# Patient Record
Sex: Female | Born: 1985 | Race: White | Hispanic: No | Marital: Single | Smoking: Current every day smoker
Health system: Southern US, Community
[De-identification: ages and names within clinical notes are randomized; demographics above are authoritative.]

## PROBLEM LIST (undated history)

## (undated) DIAGNOSIS — K219 Gastro-esophageal reflux disease without esophagitis: Secondary | ICD-10-CM

## (undated) DIAGNOSIS — M25562 Pain in left knee: Secondary | ICD-10-CM

## (undated) HISTORY — PX: GASTRECTOMY: SHX58

---

## 2011-08-15 DIAGNOSIS — S069X1A Unspecified intracranial injury with loss of consciousness of 30 minutes or less, initial encounter: Secondary | ICD-10-CM | POA: Insufficient documentation

## 2011-08-15 DIAGNOSIS — S27321A Contusion of lung, unilateral, initial encounter: Secondary | ICD-10-CM | POA: Insufficient documentation

## 2011-08-15 DIAGNOSIS — J939 Pneumothorax, unspecified: Secondary | ICD-10-CM | POA: Insufficient documentation

## 2011-08-15 DIAGNOSIS — Z041 Encounter for examination and observation following transport accident: Secondary | ICD-10-CM | POA: Insufficient documentation

## 2011-08-15 DIAGNOSIS — S2231XA Fracture of one rib, right side, initial encounter for closed fracture: Secondary | ICD-10-CM | POA: Insufficient documentation

## 2011-08-15 DIAGNOSIS — S36114A Minor laceration of liver, initial encounter: Secondary | ICD-10-CM | POA: Insufficient documentation

## 2011-08-15 DIAGNOSIS — R188 Other ascites: Secondary | ICD-10-CM | POA: Insufficient documentation

## 2011-08-16 DIAGNOSIS — D62 Acute posthemorrhagic anemia: Secondary | ICD-10-CM | POA: Insufficient documentation

## 2011-08-16 DIAGNOSIS — D696 Thrombocytopenia, unspecified: Secondary | ICD-10-CM

## 2011-08-16 HISTORY — DX: Thrombocytopenia, unspecified: D69.6

## 2011-09-19 DIAGNOSIS — M5412 Radiculopathy, cervical region: Secondary | ICD-10-CM | POA: Insufficient documentation

## 2012-03-19 ENCOUNTER — Other Ambulatory Visit: Payer: Self-pay | Admitting: Orthopaedic Surgery

## 2012-03-27 NOTE — H&P (Signed)
Arlette Schaad is an 26 y.o. female.   Chief Complaint: left knee pain HPI: Latiana suffered a left knee injury while on the job a number of months ago.  She is complaining of pain in the anterior aspect of the left knee and is worse with squatting or kneeling or stair climbing.  She has undergone extensive physical therapy, bracing, and injection and some anti-inflammatory medicines all of which have been of minimal benefit.  MRI scan done on 01/27/12 shows some irritation and wear on the patellofemoral cartilage.  We have discussed proceeding with a left knee arthroscopy to decrease pain and improve function.  No past medical history on file.  No past surgical history on file.  No family history on file. Social History:  does not have a smoking history on file. She does not have any smokeless tobacco history on file. Her alcohol and drug histories not on file.  Allergies: Allergies not on fileno drug allergies and no chronic medications.  No prescriptions prior to admission    No results found for this or any previous visit (from the past 48 hour(s)). No results found.  Review of Systems  Constitutional: Negative.   HENT: Negative.   Eyes: Negative.   Respiratory: Negative.   Cardiovascular: Negative.   Gastrointestinal: Negative.   Genitourinary: Negative.   Musculoskeletal: Positive for joint pain.  Skin: Negative.   Neurological: Negative.   Endo/Heme/Allergies: Negative.   Psychiatric/Behavioral: Negative.     There were no vitals taken for this visit. Physical Exam  Constitutional: She is oriented to person, place, and time. She appears well-nourished.  HENT:  Head: Atraumatic.  Eyes: EOM are normal.  Neck: Neck supple.  Cardiovascular: Regular rhythm.   Respiratory: Breath sounds normal.  GI: Bowel sounds are normal.  Musculoskeletal:       Left knee exam: Crepitation with extension.  No significant effusion.  Range of motion 0-1 45.  She does have pain along the  medial patellar facet palpation.  Stable ligaments.  Neurological: She is oriented to person, place, and time.  Skin: Skin is warm.  Psychiatric: She has a normal mood and affect.     Assessment/Plan Assessment: Left knee traumatic chondromalacia patella Plan: We have discussed proceeding with an arthroscopy on Willo's left knee to eliminate her pain and improve her function..  Discussed the risks of anesthesia, infection related to this intervention.  She more than likely will need extensive therapy postoperatively as well.  Chrystal Zeimet R 03/27/2012, 5:58 PM

## 2012-03-29 ENCOUNTER — Encounter (HOSPITAL_BASED_OUTPATIENT_CLINIC_OR_DEPARTMENT_OTHER): Payer: Self-pay | Admitting: *Deleted

## 2012-03-29 NOTE — Pre-Procedure Instructions (Signed)
Does not know names of meds. To call back 04/01/2012 with names and doses.

## 2012-04-02 ENCOUNTER — Encounter (HOSPITAL_BASED_OUTPATIENT_CLINIC_OR_DEPARTMENT_OTHER)
Admission: RE | Disposition: A | Discharge status: Home / Self Care | Payer: Self-pay | Source: Ambulatory Visit | Attending: Orthopaedic Surgery

## 2012-04-02 ENCOUNTER — Encounter (HOSPITAL_BASED_OUTPATIENT_CLINIC_OR_DEPARTMENT_OTHER): Payer: Self-pay | Admitting: Anesthesiology

## 2012-04-02 ENCOUNTER — Ambulatory Visit (HOSPITAL_BASED_OUTPATIENT_CLINIC_OR_DEPARTMENT_OTHER): Payer: Worker's Compensation | Admitting: Anesthesiology

## 2012-04-02 ENCOUNTER — Ambulatory Visit (HOSPITAL_BASED_OUTPATIENT_CLINIC_OR_DEPARTMENT_OTHER)
Admission: RE | Admit: 2012-04-02 | Discharge: 2012-04-02 | Disposition: A | Discharge status: Home / Self Care | Payer: Worker's Compensation | Source: Ambulatory Visit | Attending: Orthopaedic Surgery | Admitting: Orthopaedic Surgery

## 2012-04-02 ENCOUNTER — Encounter (HOSPITAL_BASED_OUTPATIENT_CLINIC_OR_DEPARTMENT_OTHER): Payer: Self-pay | Admitting: *Deleted

## 2012-04-02 DIAGNOSIS — K219 Gastro-esophageal reflux disease without esophagitis: Secondary | ICD-10-CM | POA: Insufficient documentation

## 2012-04-02 DIAGNOSIS — M2242 Chondromalacia patellae, left knee: Secondary | ICD-10-CM

## 2012-04-02 DIAGNOSIS — M2240 Chondromalacia patellae, unspecified knee: Secondary | ICD-10-CM | POA: Insufficient documentation

## 2012-04-02 HISTORY — PX: KNEE ARTHROSCOPY: SHX127

## 2012-04-02 HISTORY — DX: Gastro-esophageal reflux disease without esophagitis: K21.9

## 2012-04-02 LAB — POCT HEMOGLOBIN-HEMACUE: Hemoglobin: 10.8 g/dL — ABNORMAL LOW (ref 12.0–15.0)

## 2012-04-02 SURGERY — ARTHROSCOPY, KNEE
Anesthesia: General | Site: Knee | Laterality: Left | Wound class: Clean

## 2012-04-02 MED ORDER — DIPHENHYDRAMINE HCL 50 MG/ML IJ SOLN
12.5000 mg | Freq: Once | INTRAMUSCULAR | Status: AC | PRN
  Administered 2012-04-02: 12.5 mg via INTRAVENOUS

## 2012-04-02 MED ORDER — LACTATED RINGERS IV SOLN
INTRAVENOUS | Status: DC
Start: 2012-04-02 — End: 2012-04-02
  Administered 2012-04-02: 10 mL/h via INTRAVENOUS
  Administered 2012-04-02: 13:00:00 via INTRAVENOUS
  Administered 2012-04-02: 10 mL/h via INTRAVENOUS

## 2012-04-02 MED ORDER — OXYCODONE HCL 5 MG PO TABS: 5.0000 mg | ORAL_TABLET | Freq: Once | ORAL | Status: DC | PRN

## 2012-04-02 MED ORDER — LACTATED RINGERS IV SOLN
INTRAVENOUS | Status: DC
  Administered 2012-04-02: 12:00:00 via INTRAVENOUS

## 2012-04-02 MED ORDER — KETOROLAC TROMETHAMINE 30 MG/ML IJ SOLN
30.0000 mg | Freq: Once | INTRAMUSCULAR | Status: AC | PRN
  Administered 2012-04-02: 30 mg via INTRAVENOUS

## 2012-04-02 MED ORDER — CEFAZOLIN SODIUM-DEXTROSE 2-3 GM-% IV SOLR
2.0000 g | INTRAVENOUS | Status: AC
  Administered 2012-04-02: 2 g via INTRAVENOUS

## 2012-04-02 MED ORDER — MIDAZOLAM HCL 5 MG/5ML IJ SOLN
INTRAMUSCULAR | Status: DC | PRN
  Administered 2012-04-02: 2 mg via INTRAVENOUS

## 2012-04-02 MED ORDER — OXYCODONE HCL 5 MG PO TABS
10.0000 mg | ORAL_TABLET | Freq: Once | ORAL | Status: AC | PRN
  Administered 2012-04-02: 10 mg via ORAL

## 2012-04-02 MED ORDER — OXYCODONE HCL 5 MG/5ML PO SOLN: 5.0000 mg | Freq: Once | ORAL | Status: DC | PRN

## 2012-04-02 MED ORDER — FENTANYL CITRATE 0.05 MG/ML IJ SOLN
INTRAMUSCULAR | Status: DC | PRN
  Administered 2012-04-02 (×2): 50 ug via INTRAVENOUS

## 2012-04-02 MED ORDER — DEXAMETHASONE SODIUM PHOSPHATE 4 MG/ML IJ SOLN
INTRAMUSCULAR | Status: DC | PRN
  Administered 2012-04-02: 10 mg via INTRAVENOUS

## 2012-04-02 MED ORDER — HYDROMORPHONE HCL PF 1 MG/ML IJ SOLN
0.2500 mg | INTRAMUSCULAR | Status: DC | PRN
  Administered 2012-04-02 (×2): 0.5 mg via INTRAVENOUS

## 2012-04-02 MED ORDER — OXYCODONE-ACETAMINOPHEN 5-325 MG PO TABS: 2.0000 | ORAL_TABLET | ORAL | Status: DC | PRN

## 2012-04-02 MED ORDER — LIDOCAINE HCL (CARDIAC) 20 MG/ML IV SOLN
INTRAVENOUS | Status: DC | PRN
  Administered 2012-04-02: 100 mg via INTRAVENOUS

## 2012-04-02 MED ORDER — CHLORHEXIDINE GLUCONATE 4 % EX LIQD: 60.0000 mL | Freq: Once | CUTANEOUS | Status: DC

## 2012-04-02 MED ORDER — ONDANSETRON HCL 4 MG/2ML IJ SOLN
INTRAMUSCULAR | Status: DC | PRN
  Administered 2012-04-02: 4 mg via INTRAVENOUS

## 2012-04-02 MED ORDER — OXYCODONE HCL 5 MG/5ML PO SOLN: 5.0000 mg | Freq: Once | ORAL | Status: AC | PRN

## 2012-04-02 MED ORDER — ONDANSETRON HCL 4 MG/2ML IJ SOLN
4.0000 mg | Freq: Once | INTRAMUSCULAR | Status: AC | PRN
  Administered 2012-04-02: 4 mg via INTRAVENOUS

## 2012-04-02 MED ORDER — PROPOFOL 10 MG/ML IV BOLUS
INTRAVENOUS | Status: DC | PRN
  Administered 2012-04-02: 200 mg via INTRAVENOUS

## 2012-04-02 SURGICAL SUPPLY — 39 items
BANDAGE ELASTIC 6 VELCRO ST LF (GAUZE/BANDAGES/DRESSINGS) ×2 IMPLANT
BANDAGE GAUZE ELAST BULKY 4 IN (GAUZE/BANDAGES/DRESSINGS) ×2 IMPLANT
BLADE CUDA 5.5 (BLADE) IMPLANT
BLADE GREAT WHITE 4.2 (BLADE) ×2 IMPLANT
CANISTER OMNI JUG 16 LITER (MISCELLANEOUS) ×2 IMPLANT
CANISTER SUCTION 2500CC (MISCELLANEOUS) IMPLANT
DRAPE ARTHROSCOPY W/POUCH 114 (DRAPES) ×2 IMPLANT
DRAPE U-SHAPE 47X51 STRL (DRAPES) ×2 IMPLANT
DRSG EMULSION OIL 3X3 NADH (GAUZE/BANDAGES/DRESSINGS) ×2 IMPLANT
DURAPREP 26ML APPLICATOR (WOUND CARE) ×2 IMPLANT
ELECT MENISCUS 165MM 90D (ELECTRODE) IMPLANT
ELECT REM PT RETURN 9FT ADLT (ELECTROSURGICAL)
ELECTRODE REM PT RTRN 9FT ADLT (ELECTROSURGICAL) IMPLANT
GLOVE BIO SURGEON STRL SZ 6.5 (GLOVE) ×2 IMPLANT
GLOVE BIO SURGEON STRL SZ8.5 (GLOVE) ×2 IMPLANT
GLOVE BIOGEL PI IND STRL 7.0 (GLOVE) ×1 IMPLANT
GLOVE BIOGEL PI IND STRL 8 (GLOVE) ×1 IMPLANT
GLOVE BIOGEL PI IND STRL 8.5 (GLOVE) ×1 IMPLANT
GLOVE BIOGEL PI INDICATOR 7.0 (GLOVE) ×1
GLOVE BIOGEL PI INDICATOR 8 (GLOVE) ×1
GLOVE BIOGEL PI INDICATOR 8.5 (GLOVE) ×1
GLOVE SS BIOGEL STRL SZ 8 (GLOVE) ×1 IMPLANT
GLOVE SUPERSENSE BIOGEL SZ 8 (GLOVE) ×1
GOWN PREVENTION PLUS XLARGE (GOWN DISPOSABLE) ×4 IMPLANT
GOWN PREVENTION PLUS XXLARGE (GOWN DISPOSABLE) ×2 IMPLANT
KNEE WRAP E Z 3 GEL PACK (MISCELLANEOUS) ×2 IMPLANT
PACK ARTHROSCOPY DSU (CUSTOM PROCEDURE TRAY) ×2 IMPLANT
PACK BASIN DAY SURGERY FS (CUSTOM PROCEDURE TRAY) ×2 IMPLANT
PENCIL BUTTON HOLSTER BLD 10FT (ELECTRODE) IMPLANT
SET ARTHROSCOPY TUBING (MISCELLANEOUS) ×1
SET ARTHROSCOPY TUBING LN (MISCELLANEOUS) ×1 IMPLANT
SHEET MEDIUM DRAPE 40X70 STRL (DRAPES) ×2 IMPLANT
SPONGE GAUZE 4X4 12PLY (GAUZE/BANDAGES/DRESSINGS) ×2 IMPLANT
SYR 3ML 18GX1 1/2 (SYRINGE) IMPLANT
TOWEL OR 17X24 6PK STRL BLUE (TOWEL DISPOSABLE) ×2 IMPLANT
TOWEL OR NON WOVEN STRL DISP B (DISPOSABLE) ×2 IMPLANT
WAND 30 DEG SABER W/CORD (SURGICAL WAND) IMPLANT
WAND STAR VAC 90 (SURGICAL WAND) IMPLANT
WATER STERILE IRR 1000ML POUR (IV SOLUTION) ×2 IMPLANT

## 2012-04-02 NOTE — Interval H&P Note (Signed)
History and Physical Interval Note:  04/02/2012 12:39 PM  Patricia Mcmahon  has presented today for surgery, with the diagnosis of left knee condramylasia patella  The various methods of treatment have been discussed with the patient and family. After consideration of risks, benefits and other options for treatment, the patient has consented to  Procedure(s) (LRB) with comments: ARTHROSCOPY KNEE (Left) as a surgical intervention .  The patient's history has been reviewed, patient examined, no change in status, stable for surgery.  I have reviewed the patient's chart and labs.  Questions were answered to the patient's satisfaction.     [name] G

## 2012-04-02 NOTE — Op Note (Signed)
#  [id number] 

## 2012-04-02 NOTE — Anesthesia Preprocedure Evaluation (Addendum)
Anesthesia Evaluation  Patient identified by MRN, date of birth, ID band Patient awake    Reviewed: Allergy & Precautions, H&P , NPO status , [name] Chart, lab work & pertinent test results  Airway Mallampati: I TM Distance: >3 FB Neck ROM: Full    Dental  (+) Teeth Intact and Dental Advisory Given   Pulmonary  breath sounds clear to auscultation        Cardiovascular Rhythm:Regular Rate:Normal     Neuro/Psych    GI/Hepatic GERD-  Medicated and Controlled,  Endo/Other    Renal/GU      Musculoskeletal   Abdominal   Peds  Hematology   Anesthesia Other Findings   Reproductive/Obstetrics                           Anesthesia Physical Anesthesia Plan  ASA: II  Anesthesia Plan: General   Post-op Pain Management:    Induction: Intravenous  Airway Management Planned:   Additional Equipment:   Intra-op Plan:   Post-operative Plan: Extubation in OR  Informed Consent: I have reviewed the patients History and Physical, chart, labs and discussed the procedure including the risks, benefits and alternatives for the proposed anesthesia with the patient or authorized representative who has indicated his/her understanding and acceptance.   Dental advisory given  Plan Discussed with: CRNA, Anesthesiologist and Surgeon  Anesthesia Plan Comments:         Anesthesia Quick Evaluation

## 2012-04-02 NOTE — Transfer of Care (Signed)
Immediate Anesthesia Transfer of Care Note  Patient: Patricia Mcmahon  Procedure(s) Performed: Procedure(s) (LRB) with comments: ARTHROSCOPY KNEE (Left) - left knee arthroscopy chondroplasty  Patient Location: PACU  Anesthesia Type:General  Level of Consciousness: awake and patient cooperative  Airway & Oxygen Therapy: Patient Spontanous Breathing and Patient connected to face mask oxygen  Post-op Assessment: Report given to PACU RN and Post -op Vital signs reviewed and stable  Post vital signs: Reviewed and stable  Complications: No apparent anesthesia complications

## 2012-04-02 NOTE — Anesthesia Procedure Notes (Signed)
Procedure Name: LMA Insertion Date/Time: 04/02/2012 1:07 PM Performed by: Gar Gibbon Pre-anesthesia Checklist: Patient identified, Emergency Drugs available, Suction available and Patient being monitored Patient Re-evaluated:Patient Re-evaluated prior to inductionOxygen Delivery Method: Circle System Utilized Preoxygenation: Pre-oxygenation with 100% oxygen Intubation Type: IV induction Ventilation: Mask ventilation without difficulty LMA: LMA inserted LMA Size: 4.0 Number of attempts: 1 Airway Equipment and Method: bite block Placement Confirmation: positive ETCO2 Tube secured with: Tape Dental Injury: Teeth and Oropharynx as per pre-operative assessment

## 2012-04-02 NOTE — Anesthesia Postprocedure Evaluation (Signed)
  Anesthesia Post-op Note  Patient: Patricia Mcmahon  Procedure(s) Performed: Procedure(s) (LRB) with comments: ARTHROSCOPY KNEE (Left) - left knee arthroscopy chondroplasty  Patient Location: PACU  Anesthesia Type:General  Level of Consciousness: awake, alert  and oriented  Airway and Oxygen Therapy: Patient Spontanous Breathing  Post-op Pain: mild  Post-op Assessment: Post-op Vital signs reviewed  Post-op Vital Signs: Reviewed  Complications: No apparent anesthesia complications

## 2012-04-03 ENCOUNTER — Encounter (HOSPITAL_BASED_OUTPATIENT_CLINIC_OR_DEPARTMENT_OTHER): Payer: Self-pay | Admitting: Orthopaedic Surgery

## 2012-04-03 NOTE — Op Note (Signed)
Patricia Mcmahon, Patricia Mcmahon                ACCOUNT NO.:  [id number]  MEDICAL RECORD NO.:  [id number]  LOCATION:                                 FACILITY:  PHYSICIAN:  Lubertha Basque. Khilynn Borntreger, M.D.DATE OF BIRTH:  Sep 11, 1985  DATE OF PROCEDURE:  04/02/2012 DATE OF DISCHARGE:                              OPERATIVE REPORT   PREOPERATIVE DIAGNOSIS:  Left knee chondromalacia patella.  POSTOPERATIVE DIAGNOSIS:  Left knee chondromalacia patella.  PROCEDURE:  Left knee chondroplasty patellofemoral.  ANESTHESIA:  General.  ATTENDING SURGEON:  Lubertha Basque. Jerl Santos, MD  ASSISTANT:  Lindwood Qua, PA  INDICATION FOR PROCEDURE:  The patient is a 26 year old woman, who injured her knee many months ago.  She has been through physical therapy and bracing and pills and did achieve transient relief with an injection.  She underwent an MRI scan, which was relatively benign.  She has persisted with anterior aspect knee pain despite the aforementioned measures and continues to work but has some significant problems.  She is offered an arthroscopy.  Informed operative consent was obtained after discussion of possible complications including reaction to anesthesia and infection.  SUMMARY OF FINDINGS AND PROCEDURE:  Under general anesthesia, an arthroscopy of the left knee was performed.  The suprapatellar pouch was benign except for a small suprapatellar plica which I removed.  The patellofemoral joint did exhibit some focal breakdown at the apex of the patella and a dime-sized area.  This was grade 3 and was smoothed off with a chondroplasty.  I probed the rest of her patellofemoral cartilage and it felt fairly loose.  The patella did track in a normal position and I did not feel the lateral retinacular structures were tight, so I did not feel that lateral release would benefit her.  The medial and lateral compartments were benign with no evidence of meniscal articular cartilage injury and the ACL was normal.   She was discharged home same day.  DESCRIPTION OF PROCEDURE:  The patient was taken to the operating suite, where general anesthetic was applied without difficulty.  She was positioned supine and prepped and draped in normal sterile fashion. After the administration of IV Kefzol and appropriate time-out, an arthroscopy of the left knee was performed through total of 2 portals. Findings were as noted above and procedure consisted of the chondroplasty of the undersurface of the patella.  The knee was thoroughly irrigated followed by placement of Marcaine with epinephrine. Adaptic was placed over the portals followed by dry gauze and a loose Ace wrap.  Estimated blood loss and intraoperative fluids can be obtained from anesthesia records.  DISPOSITION:  The patient was extubated in the operating room and taken to recovery room in stable condition.  She was to go home same-day and follow up in the office closely.  I will contact her by phone tonight.     Lubertha Basque Jerl Santos, M.D.     PGD/MEDQ  D:  04/02/2012  T:  04/03/2012  Job:  [id number]

## 2012-12-11 ENCOUNTER — Other Ambulatory Visit: Payer: Self-pay | Admitting: Orthopaedic Surgery

## 2012-12-16 DIAGNOSIS — M25562 Pain in left knee: Secondary | ICD-10-CM

## 2012-12-16 HISTORY — DX: Pain in left knee: M25.562

## 2012-12-31 ENCOUNTER — Encounter (HOSPITAL_BASED_OUTPATIENT_CLINIC_OR_DEPARTMENT_OTHER): Payer: Self-pay | Admitting: *Deleted

## 2013-01-01 NOTE — H&P (Signed)
Patricia Mcmahon is an 27 y.o. female.   Chief Complaint: Left knee pain HPI: Patricia Mcmahon continues to have anterolateral left knee pain.  She is having pain with stairs and kneeling.  Also trouble and nighttime being comfortable.  Pain with ambulation.  She continues to work in physical therapy.  She has been on anti-inflammatory medications.  Previous knee arthroscopy more than 6 months ago.  We have talked about proceeding with a repeat knee arthroscopy of the left knee and lateral release.  I hope is that this will give her pain relief and restore function.  Past Medical History  Diagnosis Date  . GERD (gastroesophageal reflux disease)     no current med.  . Left knee pain 12/2012    Past Surgical History  Procedure Laterality Date  . Cesarean section    . Gastrectomy      sleeve  . Knee arthroscopy  04/02/2012    Procedure: ARTHROSCOPY KNEE;  Surgeon: Velna Ochs, MD;  Location: [facility];  Service: Orthopedics;  Laterality: Left;  left knee arthroscopy chondroplasty    History reviewed. No pertinent family history. Social History:  reports that she has been smoking Cigarettes.  She has a 16 pack-year smoking history. She has never used smokeless tobacco. She reports that  drinks alcohol. She reports that she does not use illicit drugs.  Allergies:  Allergies  Allergen Reactions  . Morphine And Related Other (See Comments)    BEHAVIOR CHANGES - BECOMES HOSTILE/VIOLENT    No prescriptions prior to admission    No results found for this or any previous visit (from the past 48 hour(s)). No results found.  Review of Systems  All other systems reviewed and are negative.    Height 5\' 10"  (1.778 m), weight 68.04 kg (150 lb), last menstrual period 12/26/2012. Physical Exam  Constitutional: She is oriented to person, place, and time. She appears well-developed.  HENT:  Head: Normocephalic.  Eyes: Pupils are equal, round, and reactive to light.  Neck: Neck  supple.  Cardiovascular: Normal heart sounds.   Respiratory: Effort normal.  GI: Bowel sounds are normal.  Musculoskeletal:  Left knee exam: Well-healed portals.  Motion is full.  Pain to palpation through the anteromedial and anterolateral aspects of the patellofemoral joint.  Mild crepitation.  No fluid on her knee.  Good ligamentous.  Patellar tendon normal.  Neurological: She is oriented to person, place, and time.  Skin: Skin is dry.  Psychiatric: She has a normal mood and affect.     Assessment/Plan Assessment: Left anterior knee pain status post arthroscopy and 04/02/12.  Plan: We have discussed proceeding with a repeat knee arthroscopy and lateral release this time.  We have discussed the risks of anesthesia, infection and DVT associated with knee arthroscopy.  There will also be a need for therapy to optimize results.  Dilara Navarrete R 01/01/2013, 6:15 PM

## 2013-01-07 ENCOUNTER — Ambulatory Visit (HOSPITAL_BASED_OUTPATIENT_CLINIC_OR_DEPARTMENT_OTHER): Payer: Worker's Compensation | Admitting: Anesthesiology

## 2013-01-07 ENCOUNTER — Encounter (HOSPITAL_BASED_OUTPATIENT_CLINIC_OR_DEPARTMENT_OTHER): Payer: Self-pay | Admitting: Anesthesiology

## 2013-01-07 ENCOUNTER — Encounter (HOSPITAL_BASED_OUTPATIENT_CLINIC_OR_DEPARTMENT_OTHER)
Admission: RE | Disposition: A | Discharge status: Home / Self Care | Payer: Self-pay | Source: Ambulatory Visit | Attending: Orthopaedic Surgery

## 2013-01-07 ENCOUNTER — Ambulatory Visit (HOSPITAL_BASED_OUTPATIENT_CLINIC_OR_DEPARTMENT_OTHER)
Admission: RE | Admit: 2013-01-07 | Discharge: 2013-01-07 | Disposition: A | Discharge status: Home / Self Care | Payer: Worker's Compensation | Source: Ambulatory Visit | Attending: Orthopaedic Surgery | Admitting: Orthopaedic Surgery

## 2013-01-07 DIAGNOSIS — M25569 Pain in unspecified knee: Secondary | ICD-10-CM | POA: Insufficient documentation

## 2013-01-07 DIAGNOSIS — K219 Gastro-esophageal reflux disease without esophagitis: Secondary | ICD-10-CM | POA: Insufficient documentation

## 2013-01-07 DIAGNOSIS — M2242 Chondromalacia patellae, left knee: Secondary | ICD-10-CM

## 2013-01-07 HISTORY — PX: KNEE ARTHROSCOPY WITH LATERAL RELEASE: SHX5649

## 2013-01-07 HISTORY — DX: Pain in left knee: M25.562

## 2013-01-07 SURGERY — ARTHROSCOPY, KNEE, WITH LATERAL RETINACULUM RELEASE
Anesthesia: General | Site: Knee | Laterality: Left | Wound class: Clean

## 2013-01-07 MED ORDER — DEXAMETHASONE SODIUM PHOSPHATE 4 MG/ML IJ SOLN
INTRAMUSCULAR | Status: DC | PRN
  Administered 2013-01-07: 10 mg via INTRAVENOUS

## 2013-01-07 MED ORDER — OXYCODONE-ACETAMINOPHEN 5-325 MG PO TABS: 1.0000 | ORAL_TABLET | ORAL | Status: DC | PRN

## 2013-01-07 MED ORDER — MIDAZOLAM HCL 5 MG/5ML IJ SOLN
INTRAMUSCULAR | Status: DC | PRN
  Administered 2013-01-07: 2 mg via INTRAVENOUS

## 2013-01-07 MED ORDER — MIDAZOLAM HCL 2 MG/2ML IJ SOLN: 1.0000 mg | INTRAMUSCULAR | Status: DC | PRN

## 2013-01-07 MED ORDER — OXYCODONE HCL 5 MG/5ML PO SOLN: 5.0000 mg | Freq: Once | ORAL | Status: DC | PRN

## 2013-01-07 MED ORDER — FENTANYL CITRATE 0.05 MG/ML IJ SOLN
25.0000 ug | INTRAMUSCULAR | Status: DC | PRN
  Administered 2013-01-07: 50 ug via INTRAVENOUS
  Administered 2013-01-07: 25 ug via INTRAVENOUS
  Administered 2013-01-07: 50 ug via INTRAVENOUS

## 2013-01-07 MED ORDER — FENTANYL CITRATE 0.05 MG/ML IJ SOLN
INTRAMUSCULAR | Status: DC | PRN
  Administered 2013-01-07: 100 ug via INTRAVENOUS

## 2013-01-07 MED ORDER — MIDAZOLAM HCL 2 MG/ML PO SYRP: 12.0000 mg | ORAL_SOLUTION | Freq: Once | ORAL | Status: DC | PRN

## 2013-01-07 MED ORDER — LIDOCAINE HCL (CARDIAC) 20 MG/ML IV SOLN
INTRAVENOUS | Status: DC | PRN
  Administered 2013-01-07: 100 mg via INTRAVENOUS

## 2013-01-07 MED ORDER — LACTATED RINGERS IV SOLN: INTRAVENOUS | Status: DC

## 2013-01-07 MED ORDER — SODIUM CHLORIDE 0.9 % IR SOLN
Status: DC | PRN
  Administered 2013-01-07: 6000 mL

## 2013-01-07 MED ORDER — MIDAZOLAM HCL 2 MG/2ML IJ SOLN: 0.5000 mg | Freq: Once | INTRAMUSCULAR | Status: DC | PRN

## 2013-01-07 MED ORDER — OXYCODONE HCL 5 MG PO TABS: 5.0000 mg | ORAL_TABLET | Freq: Once | ORAL | Status: DC | PRN

## 2013-01-07 MED ORDER — MEPERIDINE HCL 25 MG/ML IJ SOLN: 6.2500 mg | INTRAMUSCULAR | Status: DC | PRN

## 2013-01-07 MED ORDER — LACTATED RINGERS IV SOLN
INTRAVENOUS | Status: DC
  Administered 2013-01-07: 20 mL/h via INTRAVENOUS
  Administered 2013-01-07: 10:00:00 via INTRAVENOUS

## 2013-01-07 MED ORDER — PROPOFOL 10 MG/ML IV BOLUS
INTRAVENOUS | Status: DC | PRN
  Administered 2013-01-07: 200 mg via INTRAVENOUS

## 2013-01-07 MED ORDER — FENTANYL CITRATE 0.05 MG/ML IJ SOLN: 50.0000 ug | INTRAMUSCULAR | Status: DC | PRN

## 2013-01-07 MED ORDER — ONDANSETRON HCL 4 MG/2ML IJ SOLN
4.0000 mg | Freq: Once | INTRAMUSCULAR | Status: AC
  Administered 2013-01-07: 4 mg via INTRAVENOUS

## 2013-01-07 MED ORDER — CHLORHEXIDINE GLUCONATE 4 % EX LIQD: 60.0000 mL | Freq: Once | CUTANEOUS | Status: DC

## 2013-01-07 MED ORDER — PROMETHAZINE HCL 25 MG/ML IJ SOLN
6.2500 mg | INTRAMUSCULAR | Status: DC | PRN
  Administered 2013-01-07: 6.25 mg via INTRAVENOUS

## 2013-01-07 MED ORDER — BUPIVACAINE-EPINEPHRINE 0.5% -1:200000 IJ SOLN
INTRAMUSCULAR | Status: DC | PRN
  Administered 2013-01-07: 20 mL

## 2013-01-07 SURGICAL SUPPLY — 42 items
BANDAGE ELASTIC 6 VELCRO ST LF (GAUZE/BANDAGES/DRESSINGS) ×2 IMPLANT
BANDAGE GAUZE ELAST BULKY 4 IN (GAUZE/BANDAGES/DRESSINGS) ×2 IMPLANT
BLADE CUDA 5.5 (BLADE) IMPLANT
BLADE GREAT WHITE 4.2 (BLADE) ×2 IMPLANT
CANISTER OMNI JUG 16 LITER (MISCELLANEOUS) ×2 IMPLANT
CANISTER SUCTION 2500CC (MISCELLANEOUS) IMPLANT
DRAPE ARTHROSCOPY W/POUCH 114 (DRAPES) ×2 IMPLANT
DRAPE U 20/CS (DRAPES) ×2 IMPLANT
DRAPE U-SHAPE 47X51 STRL (DRAPES) ×2 IMPLANT
DRSG EMULSION OIL 3X3 NADH (GAUZE/BANDAGES/DRESSINGS) ×2 IMPLANT
DURAPREP 26ML APPLICATOR (WOUND CARE) ×2 IMPLANT
ELECT MENISCUS 165MM 90D (ELECTRODE) IMPLANT
ELECT REM PT RETURN 9FT ADLT (ELECTROSURGICAL)
ELECTRODE REM PT RTRN 9FT ADLT (ELECTROSURGICAL) IMPLANT
GLOVE BIO SURGEON STRL SZ8.5 (GLOVE) ×2 IMPLANT
GLOVE BIOGEL PI IND STRL 7.0 (GLOVE) ×1 IMPLANT
GLOVE BIOGEL PI IND STRL 8 (GLOVE) ×1 IMPLANT
GLOVE BIOGEL PI IND STRL 8.5 (GLOVE) ×1 IMPLANT
GLOVE BIOGEL PI INDICATOR 7.0 (GLOVE) ×1
GLOVE BIOGEL PI INDICATOR 8 (GLOVE) ×1
GLOVE BIOGEL PI INDICATOR 8.5 (GLOVE) ×1
GLOVE ECLIPSE 6.5 STRL STRAW (GLOVE) ×2 IMPLANT
GLOVE EXAM NITRILE LRG STRL (GLOVE) ×2 IMPLANT
GLOVE SS BIOGEL STRL SZ 8 (GLOVE) ×1 IMPLANT
GLOVE SUPERSENSE BIOGEL SZ 8 (GLOVE) ×1
GOWN PREVENTION PLUS XLARGE (GOWN DISPOSABLE) ×4 IMPLANT
GOWN PREVENTION PLUS XXLARGE (GOWN DISPOSABLE) ×2 IMPLANT
IV NS IRRIG 3000ML ARTHROMATIC (IV SOLUTION) ×4 IMPLANT
KNEE WRAP E Z 3 GEL PACK (MISCELLANEOUS) ×2 IMPLANT
PACK ARTHROSCOPY DSU (CUSTOM PROCEDURE TRAY) ×2 IMPLANT
PACK BASIN DAY SURGERY FS (CUSTOM PROCEDURE TRAY) ×2 IMPLANT
PENCIL BUTTON HOLSTER BLD 10FT (ELECTRODE) IMPLANT
SET ARTHROSCOPY TUBING (MISCELLANEOUS) ×1
SET ARTHROSCOPY TUBING LN (MISCELLANEOUS) ×1 IMPLANT
SHEET MEDIUM DRAPE 40X70 STRL (DRAPES) ×2 IMPLANT
SPONGE GAUZE 4X4 12PLY (GAUZE/BANDAGES/DRESSINGS) ×2 IMPLANT
SYR 3ML 18GX1 1/2 (SYRINGE) IMPLANT
TOWEL OR 17X24 6PK STRL BLUE (TOWEL DISPOSABLE) ×2 IMPLANT
TOWEL OR NON WOVEN STRL DISP B (DISPOSABLE) IMPLANT
WAND 30 DEG SABER W/CORD (SURGICAL WAND) ×2 IMPLANT
WAND STAR VAC 90 (SURGICAL WAND) IMPLANT
WATER STERILE IRR 1000ML POUR (IV SOLUTION) ×2 IMPLANT

## 2013-01-07 NOTE — Op Note (Signed)
NAMEJESSILYN, Patricia Mcmahon                ACCOUNT NO.:  [id number]  MEDICAL RECORD NO.:  [id number]  LOCATION:                               FACILITY:  [facility]  PHYSICIAN:  Lubertha Basque. Nasif Bos, M.D.DATE OF BIRTH:  1986-01-02  DATE OF PROCEDURE:  01/07/2013 DATE OF DISCHARGE:  01/07/2013                              OPERATIVE REPORT   PREOPERATIVE DIAGNOSIS:  Left knee chondromalacia patella.  POSTOPERATIVE DIAGNOSIS:  Left knee chondromalacia patella.  PROCEDURE: 1. Left knee arthroscopic chondroplasty. 2. Left knee arthroscopic lateral release.  ANESTHESIA:  General.  ATTENDING SURGEON:  Lubertha Basque. Jerl Santos, M.D.  ASSISTANT:  Lindwood Qua, PA.  INDICATION FOR PROCEDURE:  The patient is a 27 year old woman who is about a year from an arthroscopy of her left knee.  She has persisted with some anterior pain since that time despite aggressive physical therapy and various pills and braces.  By postoperative scan, she still has some chondromalacia patella.  She has pain which limits her ability to rest and work and at this point, she is offered a repeat arthroscopy. Informed operative consent was obtained after discussion of possible complications including reaction to anesthesia and infection.  SUMMARY OF FINDINGS AND PROCEDURE:  Under general anesthesia, an arthroscopy of the left knee was performed.  The suprapatellar pouch was benign while the patellofemoral again exhibited some grade 3 breakdown. No exposed bone was present.  The patellar cartilage was quite soft to probing and not healthy.  The intertrochlear groove appeared completely benign.  She tracked in a slightly lateral position.  The lateral structures that did not feel particularly tight.  Medial and lateral compartments were benign with no evidence of meniscal or articular cartilage injury and the ACL was intact.  I performed a brief chondroplasty of the undersurface of the patella, followed by an arthroscopic lateral  release through an additional portal.  She was discharged home the same day to follow up in less than a week.  DESCRIPTION OF PROCEDURE:  The patient was taken to the OR suite where general anesthetic was applied.  She was positioned supine and prepped and draped in normal sterile fashion.  After the administration of preop IV Kefzol, an appropriate time out, and arthroscopy of the left knee was performed through total of 3 portals.  Findings were as noted above and procedure consisted of the chondroplasty of the undersurface of the patella followed by the arthroscopic lateral release which was done with an ArthroCare wand.  Pump pressure was decreased and some bleeding was easily controlled with the cautery, portion of this device.  Once this was accomplished, the patella tracked centrally with no tension.  The knee was thoroughly irrigated, followed by placement of Marcaine with epinephrine.  Adaptic was placed over the portals followed by dry gauze and a loose Ace wrap.  ESTIMATED BLOOD LOSS AND FLUIDS:  Can be obtained from anesthesia records.  DISPOSITION:  The patient was then taken to recovery in stable condition.  She was to be discharged home same day and follow up in the office in less than a week.  I will contact her by phone tonight.     Lubertha Basque Jerl Santos, M.D.  PGD/MEDQ  D:  01/07/2013  T:  01/07/2013  Job:  [id number]

## 2013-01-07 NOTE — Transfer of Care (Signed)
Immediate Anesthesia Transfer of Care Note  Patient: Patricia Mcmahon  Procedure(s) Performed: Procedure(s): LEFT KNEE ARTHROSCOPY, CHONDROPLASTY AND  LATERAL RELEASE (Left)  Patient Location: PACU  Anesthesia Type:General  Level of Consciousness: sedated  Airway & Oxygen Therapy: Patient Spontanous Breathing and Patient connected to face mask oxygen  Post-op Assessment: Report given to PACU RN and Post -op Vital signs reviewed and stable  Post vital signs: Reviewed and stable  Complications: No apparent anesthesia complications

## 2013-01-07 NOTE — Anesthesia Preprocedure Evaluation (Signed)
Anesthesia Evaluation  Patient identified by MRN, date of birth, ID band Patient awake    Reviewed: Allergy & Precautions, H&P , NPO status , [name] Chart, lab work & pertinent test results  History of Anesthesia Complications Negative for: history of anesthetic complications  Airway Mallampati: I TM Distance: >3 FB Neck ROM: Full    Dental  (+) Poor Dentition and Dental Advisory Given   Pulmonary Current Smoker,  breath sounds clear to auscultation  Pulmonary exam normal       Cardiovascular negative cardio ROS  Rhythm:Regular Rate:Normal     Neuro/Psych negative neurological ROS     GI/Hepatic Neg liver ROS, GERD-  Controlled,  Endo/Other  negative endocrine ROS  Renal/GU negative Renal ROS     Musculoskeletal   Abdominal   Peds  Hematology negative hematology ROS (+)   Anesthesia Other Findings   Reproductive/Obstetrics LMP 12/26/12                           Anesthesia Physical Anesthesia Plan  ASA: II  Anesthesia Plan: General   Post-op Pain Management:    Induction: Intravenous  Airway Management Planned: LMA  Additional Equipment:   Intra-op Plan:   Post-operative Plan:   Informed Consent: I have reviewed the patients History and Physical, chart, labs and discussed the procedure including the risks, benefits and alternatives for the proposed anesthesia with the patient or authorized representative who has indicated his/her understanding and acceptance.   Dental advisory given  Plan Discussed with: CRNA and Surgeon  Anesthesia Plan Comments: (Plan routine monitors, GA- LMA OK)        Anesthesia Quick Evaluation

## 2013-01-07 NOTE — Interval H&P Note (Signed)
History and Physical Interval Note:  01/07/2013 9:59 AM  Patricia Mcmahon  has presented today for surgery, with the diagnosis of LEFT KNEE PAIN   The various methods of treatment have been discussed with the patient and family. After consideration of risks, benefits and other options for treatment, the patient has consented to  Procedure(s): LEFT KNEE ARTHROSCOPY WITH LATERAL RELEASE (Left) as a surgical intervention .  The patient's history has been reviewed, patient examined, no change in status, stable for surgery.  I have reviewed the patient's chart and labs.  Questions were answered to the patient's satisfaction.     Darletta Noblett G

## 2013-01-07 NOTE — Anesthesia Postprocedure Evaluation (Signed)
  Anesthesia Post-op Note  Patient: Patricia Mcmahon  Procedure(s) Performed: Procedure(s): LEFT KNEE ARTHROSCOPY, CHONDROPLASTY AND  LATERAL RELEASE (Left)  Patient Location: PACU  Anesthesia Type:General  Level of Consciousness: awake, alert , oriented and patient cooperative  Airway and Oxygen Therapy: Patient Spontanous Breathing  Post-op Pain: mild  Post-op Assessment: Post-op Vital signs reviewed, [name] Cardiovascular Status Stable, Respiratory Function Stable, Patent Airway, No signs of Nausea or vomiting and Pain level controlled  Post-op Vital Signs: Reviewed and stable  Complications: No apparent anesthesia complications

## 2013-01-07 NOTE — Anesthesia Procedure Notes (Signed)
Procedure Name: LMA Insertion Date/Time: 01/07/2013 10:29 AM Performed by: Burna Cash Pre-anesthesia Checklist: Patient identified, Emergency Drugs available, Suction available and Patient being monitored Patient Re-evaluated:Patient Re-evaluated prior to inductionOxygen Delivery Method: Circle System Utilized Preoxygenation: Pre-oxygenation with 100% oxygen Intubation Type: IV induction Ventilation: Mask ventilation without difficulty LMA: LMA inserted LMA Size: 4.0 Number of attempts: 1 Airway Equipment and Method: bite block Placement Confirmation: positive ETCO2 Tube secured with: Tape Dental Injury: Teeth and Oropharynx as per pre-operative assessment

## 2013-01-07 NOTE — Op Note (Signed)
#  [id number] 

## 2013-01-08 ENCOUNTER — Encounter (HOSPITAL_BASED_OUTPATIENT_CLINIC_OR_DEPARTMENT_OTHER): Payer: Self-pay | Admitting: Orthopaedic Surgery

## 2018-03-30 ENCOUNTER — Other Ambulatory Visit: Payer: Self-pay

## 2018-03-30 ENCOUNTER — Encounter (HOSPITAL_BASED_OUTPATIENT_CLINIC_OR_DEPARTMENT_OTHER): Payer: Self-pay | Admitting: Emergency Medicine

## 2018-03-30 DIAGNOSIS — R51 Headache: Secondary | ICD-10-CM | POA: Insufficient documentation

## 2018-03-30 DIAGNOSIS — Z79899 Other long term (current) drug therapy: Secondary | ICD-10-CM | POA: Insufficient documentation

## 2018-03-30 NOTE — ED Triage Notes (Signed)
Patient states that she was taking a shower and slipped and fell - her right head hit the ledge with her head. She has a laceration to her right side of her scalp  - bleeding is controlled. She reports that she was knocked out. Patient also reports that he retainer may have broken

## 2018-03-31 ENCOUNTER — Emergency Department (HOSPITAL_BASED_OUTPATIENT_CLINIC_OR_DEPARTMENT_OTHER)
Admission: EM | Admit: 2018-03-31 | Discharge: 2018-03-31 | Disposition: A | Discharge status: Home / Self Care | Payer: Self-pay | Attending: Emergency Medicine | Admitting: Emergency Medicine

## 2018-03-31 ENCOUNTER — Encounter (HOSPITAL_BASED_OUTPATIENT_CLINIC_OR_DEPARTMENT_OTHER): Payer: Self-pay | Admitting: Emergency Medicine

## 2018-03-31 ENCOUNTER — Emergency Department (HOSPITAL_BASED_OUTPATIENT_CLINIC_OR_DEPARTMENT_OTHER): Payer: Self-pay

## 2018-03-31 ENCOUNTER — Other Ambulatory Visit: Payer: Self-pay

## 2018-03-31 DIAGNOSIS — Y999 Unspecified external cause status: Secondary | ICD-10-CM | POA: Insufficient documentation

## 2018-03-31 DIAGNOSIS — Z79899 Other long term (current) drug therapy: Secondary | ICD-10-CM | POA: Insufficient documentation

## 2018-03-31 DIAGNOSIS — Y93E1 Activity, personal bathing and showering: Secondary | ICD-10-CM | POA: Insufficient documentation

## 2018-03-31 DIAGNOSIS — S0101XA Laceration without foreign body of scalp, initial encounter: Secondary | ICD-10-CM | POA: Insufficient documentation

## 2018-03-31 DIAGNOSIS — F17210 Nicotine dependence, cigarettes, uncomplicated: Secondary | ICD-10-CM | POA: Insufficient documentation

## 2018-03-31 DIAGNOSIS — W182XXA Fall in (into) shower or empty bathtub, initial encounter: Secondary | ICD-10-CM | POA: Insufficient documentation

## 2018-03-31 DIAGNOSIS — Y92002 Bathroom of unspecified non-institutional (private) residence single-family (private) house as the place of occurrence of the external cause: Secondary | ICD-10-CM | POA: Insufficient documentation

## 2018-03-31 DIAGNOSIS — W19XXXA Unspecified fall, initial encounter: Secondary | ICD-10-CM

## 2018-03-31 MED ORDER — KETOROLAC TROMETHAMINE 15 MG/ML IJ SOLN
15.0000 mg | Freq: Once | INTRAMUSCULAR | Status: AC
  Administered 2018-03-31: 15 mg via INTRAMUSCULAR
  Filled 2018-03-31: qty 1

## 2018-03-31 MED ORDER — TETANUS-DIPHTH-ACELL PERTUSSIS 5-2.5-18.5 LF-MCG/0.5 IM SUSP
0.5000 mL | Freq: Once | INTRAMUSCULAR | Status: AC
  Administered 2018-03-31: 0.5 mL via INTRAMUSCULAR
  Filled 2018-03-31: qty 0.5

## 2018-03-31 MED ORDER — ONDANSETRON 8 MG PO TBDP
8.0000 mg | ORAL_TABLET | Freq: Once | ORAL | Status: AC
  Administered 2018-03-31: 8 mg via ORAL
  Filled 2018-03-31: qty 1

## 2018-03-31 MED ORDER — LIDOCAINE-PRILOCAINE 2.5-2.5 % EX CREA
TOPICAL_CREAM | Freq: Once | CUTANEOUS | Status: DC
  Filled 2018-03-31: qty 5

## 2018-03-31 NOTE — ED Triage Notes (Signed)
Patient states that she was taking a shower last night and slipped and fell - her right head hit the ledge with her head. She has a laceration to her right side of her scalp  - bleeding is controlled. She reports that she was knocked out. Patient also reports that he retainer may have broken  - the patient came in last night but left before she was seen

## 2018-03-31 NOTE — Discharge Instructions (Addendum)
Evaluated today for fall and head laceration. This was closed with 6 staples.You will need to have these removed in 7-10 days. You may have these removed at your PCP office, urgent care or the ED. You may take Tylenol as needed for your pain. Follow up with your PCP for reevaluation.  Return to the ED with any new or worsening symptoms.

## 2018-03-31 NOTE — ED Notes (Signed)
Pt called to be taken to treatment room- did not respond. Registration reported pt left.

## 2018-03-31 NOTE — ED Provider Notes (Signed)
[facility] EMERGENCY DEPARTMENT Provider Note   CSN: [id number] Arrival date & time: 03/31/18  1121   History   Chief Complaint Chief Complaint  Patient presents with  . Fall    HPI Patricia Mcmahon is a 32 y.o. female with no significant past medical history who presents for evaluation after mechanical fall.  Patient states that she was taking a shower at approximately 11 PM yesterday evening when she slipped on soap and hit the posterior portion of her head to the tile ledge in her shower.  Patient states she has a headache rated 8/10 located to her posterior head.  Pain does not radiate.  Desribes pain is throbbing. Patient denies emesis, however states she has had intermittent nausea today.  Patient states "I was knocked out" however she also states that she remembers the entire accident.  Denies fever, chills, vision changes, weakness, slurred speech, midline neck pain, midline back pain, pain/decreased range of motion in her extremities.  Denies chest pain, abdominal pain, dysuria, diarrhea or constipation, dizziness.  Denies bowel or bladder incontinence, saddle paresthesia.  Denies use of anticoagulation.  History obtained from patient and significant other.  No interpreter was used.  HPI  Past Medical History:  Diagnosis Date  . GERD (gastroesophageal reflux disease)    no current med.  . Left knee pain 12/2012    There are no active problems to display for this patient.   Past Surgical History:  Procedure Laterality Date  . CESAREAN SECTION    . GASTRECTOMY     sleeve  . KNEE ARTHROSCOPY  04/02/2012   Procedure: ARTHROSCOPY KNEE;  Surgeon: Velna Ochs, MD;  Location: [facility];  Service: Orthopedics;  Laterality: Left;  left knee arthroscopy chondroplasty  . KNEE ARTHROSCOPY WITH LATERAL RELEASE Left 01/07/2013   Procedure: LEFT KNEE ARTHROSCOPY, CHONDROPLASTY AND  LATERAL RELEASE;  Surgeon: Velna Ochs, MD;  Location: [facility];  Service: Orthopedics;  Laterality: Left;     OB History   No obstetric history on file.      Home Medications    Prior to Admission medications   Medication Sig Start Date End Date Taking? Authorizing Provider  meloxicam (MOBIC) 15 MG tablet Take 15 mg by mouth daily.    [provider]  methocarbamol (ROBAXIN) 500 MG tablet Take 500 mg by mouth daily.    [provider]  Multiple Vitamin (MULTIVITAMIN) tablet Take 1 tablet by mouth daily.    [provider]  norgestimate-ethinyl estradiol (ORTHO-CYCLEN,SPRINTEC,PREVIFEM) 0.25-35 MG-MCG tablet Take 1 tablet by mouth daily.    [provider]  oxyCODONE-acetaminophen (ROXICET) 5-325 MG per tablet Take 1 tablet by mouth every 4 (four) hours as needed for pain. 01/07/13   Lindwood Qua, PA-C  vitamin B-12 (CYANOCOBALAMIN) 100 MCG tablet Take 100 mcg by mouth daily.    [provider]    Family History History reviewed. No pertinent family history.  Social History Social History   Tobacco Use  . Smoking status: Current Every Day Smoker    Packs/day: 1.00    Years: 16.00    Pack years: 16.00    Types: Cigarettes  . Smokeless tobacco: Never Used  Substance Use Topics  . Alcohol use: Yes    Comment: socially  . Drug use: No     Allergies   Morphine and related   Review of Systems Review of Systems  Constitutional: Negative.   HENT: Negative.   Gastrointestinal: Negative for  abdominal pain, anal bleeding, constipation, diarrhea and vomiting.  Genitourinary: Negative.   Musculoskeletal: Negative.   Skin: Positive for wound.  Neurological: Positive for headaches. Negative for dizziness, tremors, seizures, syncope, facial asymmetry, speech difficulty, weakness, light-headedness and numbness.  All other systems reviewed and are negative.    Physical Exam Updated Vital Signs BP 122/86 (BP Location: Right Arm)   Pulse 85   Temp 98.5 F (36.9 C) (Oral)    Resp 18   Ht 5\' 9"  (1.753 m)   Wt 74.8 kg   SpO2 96%   BMI 24.35 kg/m   Physical Exam  Physical Exam  Constitutional: Pt is oriented to person, place, and time. Pt appears well-developed and well-nourished. No distress.  HENT:  Head: Normocephalic.  Tenderness palpation over occipital head.  Patient does have a 4 cm laceration to posterior scalp.  Area is not actively bleeding.  There is no drainage. No facial contusions or abrasions. Mouth/Throat: Oropharynx is clear and moist.  Eyes: Conjunctivae and EOM are normal. Pupils are equal, round, and reactive to light. No scleral icterus. No raccoon eyes, battles sign. No horizontal, vertical or rotational nystagmus . No hemotympanum. Neck: Normal range of motion. Neck supple.  Full active and passive ROM without pain No midline or paraspinal tenderness No nuchal rigidity or meningeal signs  Cardiovascular: Normal rate, regular rhythm and intact distal pulses.   Pulmonary/Chest: Effort normal and breath sounds normal. No respiratory distress. Pt has no wheezes. No rales.  Abdominal: Soft. Bowel sounds are normal. There is no tenderness. There is no rebound and no guarding.  Musculoskeletal: Normal range of motion.  No midline cervical, thoracic or lumbar tenderness palpation.  No step-offs.  Full range of motion cervical, thoracic and lumbar spine.  No paraspinal tenderness.  Moves all extremities without difficulty and without ataxia. Lymphadenopathy:    No cervical adenopathy.  Neurological: Pt. is alert and oriented to person, place, and time. He has normal reflexes. No cranial nerve deficit.  Exhibits normal muscle tone. Coordination normal.  Mental Status:  Alert, oriented, thought content appropriate. Speech fluent without evidence of aphasia. Able to follow 2 step commands without difficulty.  Cranial Nerves:  II:  Peripheral visual fields grossly normal, pupils equal, round, reactive to light III,IV, VI: ptosis not present,  extra-ocular motions intact bilaterally  V,VII: smile symmetric, facial light touch sensation equal VIII: hearing grossly normal bilaterally  IX,X: midline uvula rise  XI: bilateral shoulder shrug equal and strong XII: midline tongue extension  Motor:  5/5 in upper and lower extremities bilaterally including strong and equal grip strength and dorsiflexion/plantar flexion Sensory: Pinprick and light touch normal in all extremities.  Deep Tendon Reflexes: 2+ and symmetric  Cerebellar: normal finger-to-nose with bilateral upper extremities Gait: normal gait and balance CV: distal pulses palpable throughout   Skin: Skin is warm and dry. No rash noted. Pt is not diaphoretic. 4cm laceration to occipital scalp. No active bleeding. No discharge. No hematoma. Psychiatric: Pt has a normal mood and affect. Behavior is normal. Judgment and thought content normal.  Nursing note and vitals reviewed. ED Treatments / Results  Labs (all labs ordered are listed, but only abnormal results are displayed) Labs Reviewed - No data to display  EKG None  Radiology Ct Head Wo Contrast  Result Date: 03/31/2018 CLINICAL DATA:  Patient fell in bathtub yesterday hitting right-sided head with loss of consciousness. Headaches with dizziness and nausea. EXAM: CT HEAD WITHOUT CONTRAST TECHNIQUE: Contiguous axial images were obtained from the  base of the skull through the vertex without intravenous contrast. COMPARISON:  None. FINDINGS: Brain: There is no evidence of acute intracranial hemorrhage, mass lesion, brain edema or extra-axial fluid collection. The ventricles and subarachnoid spaces are appropriately sized for age. There is no CT evidence of acute cortical infarction. Vascular:  No hyperdense vessel identified. Skull: Negative for fracture or focal lesion. Sinuses/Orbits: There is mucosal thickening in the ethmoid sinuses. There are air-fluid levels in the maxillary sinuses bilaterally. No displaced facial  fractures are visualized. The mastoid air cells and middle ears are clear. The orbits appear unremarkable. Other: Soft tissue swelling and probable laceration in the right posterior parietooccipital scalp. No evidence of foreign body. IMPRESSION: 1. No acute intracranial findings. 2. Right parietooccipital scalp soft tissue injury without underlying calvarial fracture. 3. Ethmoid sinus mucosal thickening and air-fluid levels in the maxillary sinuses, presumably from inflammatory sinus disease. No displaced facial fractures visualized. Electronically Signed   By: Carey Bullocks M.D.   On: 03/31/2018 12:56    Procedures .Marland KitchenLaceration Repair Date/Time: 03/31/2018 1:26 PM Performed by: Linwood Dibbles, PA-C Authorized by: Linwood Dibbles, PA-C   Consent:    Consent obtained:  Verbal   Consent given by:  Patient   Risks discussed:  Infection, need for additional repair, pain, poor cosmetic result and poor wound healing   Alternatives discussed:  No treatment and delayed treatment Universal protocol:    Procedure explained and questions answered to patient or proxy's satisfaction: yes     Relevant documents present and verified: yes     Test results available and properly labeled: yes     Imaging studies available: yes     Required blood products, implants, devices, and special equipment available: yes     Site/side marked: yes     Immediately prior to procedure, a time out was called: yes     Patient identity confirmed:  Verbally with patient Anesthesia (see MAR for exact dosages):    Anesthesia method:  Topical application Laceration details:    Location:  Scalp   Scalp location:  Occipital   Length (cm):  4 Repair type:    Repair type:  Simple Pre-procedure details:    Preparation:  Patient was prepped and draped in usual sterile fashion and imaging obtained to evaluate for foreign bodies Exploration:    Hemostasis achieved with:  Direct pressure   Wound exploration: wound  explored through full range of motion and entire depth of wound probed and visualized     Wound extent: no muscle damage noted, no nerve damage noted, no tendon damage noted, no underlying fracture noted and no vascular damage noted   Treatment:    Area cleansed with:  Betadine   Amount of cleaning:  Standard Skin repair:    Repair method:  Staples   Number of staples:  6 Approximation:    Approximation:  Close Post-procedure details:    Dressing:  Open (no dressing)   Patient tolerance of procedure:  Tolerated well, no immediate complications   (including critical care time)  Medications Ordered in ED Medications  Tdap (BOOSTRIX) injection 0.5 mL (0.5 mLs Intramuscular Given 03/31/18 1250)  ketorolac (TORADOL) 15 MG/ML injection 15 mg (15 mg Intramuscular Given 03/31/18 1333)  ondansetron (ZOFRAN-ODT) disintegrating tablet 8 mg (8 mg Oral Given 03/31/18 1333)     Initial Impression / Assessment and Plan / ED Course  I have reviewed the triage vital signs and the nursing notes.  Pertinent labs & imaging results that  were available during my care of the patient were reviewed by me and considered in my medical decision making (see chart for details).  32 year old female who appears otherwise well presents for evaluation after mechanical fall.  Fall occurred approximately 11 hours PTA.  Patient states she slipped and fell in the shower yesterday night.  Patient has had a headache as well as a laceration to her posterior scalp over the occipital region since incident. Denies dizziness, lightheadedness, vomiting, slurred speech, weakness, unilateral changes. HA [facility], [facility], Meningitis, or temporal arteritis, likely from fall. Pt is afebrile with no focal neuro deficits, nuchal rigidity, or change in vision. CT head negative. Normal musculoskeletal exam.  Laceration repaired with 6 staples thoroughly cleaned. See procedure note. HA improved with Toradol and Zofran in  department.  Able to tolerate p.o. intake in department.  Patient has been able to ambulate without difficulty or without ataxia. Normal  neurologic exam. Discussed follow up for staple removal.  Patient is hemodynamically stable and appropriate for DC home at this time.  Discussed return precautions.  Patient voiced understanding and is agreeable for follow-up.  .  Final Clinical Impressions(s) / ED Diagnoses   Final diagnoses:  Fall, initial encounter  Laceration of scalp, initial encounter    ED Discharge Orders    None       Sanah Kraska A, PA-C 03/31/18 1433    Rolan BuccoBelfi, Melanie, MD 03/31/18 1435

## 2018-04-10 ENCOUNTER — Encounter (HOSPITAL_BASED_OUTPATIENT_CLINIC_OR_DEPARTMENT_OTHER): Payer: Self-pay | Admitting: Adult Health

## 2018-04-10 ENCOUNTER — Other Ambulatory Visit: Payer: Self-pay

## 2018-04-10 ENCOUNTER — Emergency Department (HOSPITAL_BASED_OUTPATIENT_CLINIC_OR_DEPARTMENT_OTHER)
Admission: EM | Admit: 2018-04-10 | Discharge: 2018-04-10 | Disposition: A | Discharge status: Home / Self Care | Payer: Self-pay | Attending: Emergency Medicine | Admitting: Emergency Medicine

## 2018-04-10 DIAGNOSIS — Z4802 Encounter for removal of sutures: Secondary | ICD-10-CM

## 2018-04-10 DIAGNOSIS — Z79899 Other long term (current) drug therapy: Secondary | ICD-10-CM | POA: Insufficient documentation

## 2018-04-10 DIAGNOSIS — F17210 Nicotine dependence, cigarettes, uncomplicated: Secondary | ICD-10-CM | POA: Insufficient documentation

## 2018-04-10 DIAGNOSIS — Z9884 Bariatric surgery status: Secondary | ICD-10-CM | POA: Insufficient documentation

## 2018-04-10 DIAGNOSIS — S0101XD Laceration without foreign body of scalp, subsequent encounter: Secondary | ICD-10-CM | POA: Insufficient documentation

## 2018-04-10 DIAGNOSIS — X58XXXD Exposure to other specified factors, subsequent encounter: Secondary | ICD-10-CM | POA: Insufficient documentation

## 2018-04-10 NOTE — ED Triage Notes (Signed)
PT here for 6 staples to be removed from the back of her head. In for over 10 days.

## 2018-04-10 NOTE — ED Provider Notes (Signed)
[facility] EMERGENCY DEPARTMENT Provider Note   CSN: [id number] Arrival date & time: 04/10/18  1610     History   Chief Complaint No chief complaint on file.   HPI Patricia Mcmahon is a 32 y.o. female who presents for staple removal after staples were placed 10 days ago on her scalp.  She has had good healing.  She has had some soreness, but no drainage, swelling, redness, or fever.    HPI  Past Medical History:  Diagnosis Date  . GERD (gastroesophageal reflux disease)    no current med.  . Left knee pain 12/2012    There are no active problems to display for this patient.   Past Surgical History:  Procedure Laterality Date  . CESAREAN SECTION    . GASTRECTOMY     sleeve  . KNEE ARTHROSCOPY  04/02/2012   Procedure: ARTHROSCOPY KNEE;  Surgeon: Velna OchsPeter G Dalldorf, MD;  Location: [facility];  Service: Orthopedics;  Laterality: Left;  left knee arthroscopy chondroplasty  . KNEE ARTHROSCOPY WITH LATERAL RELEASE Left 01/07/2013   Procedure: LEFT KNEE ARTHROSCOPY, CHONDROPLASTY AND  LATERAL RELEASE;  Surgeon: Velna OchsPeter G Dalldorf, MD;  Location: [facility];  Service: Orthopedics;  Laterality: Left;     OB History   No obstetric history on file.      Home Medications    Prior to Admission medications   Medication Sig Start Date End Date Taking? Authorizing Provider  meloxicam (MOBIC) 15 MG tablet Take 15 mg by mouth daily.    [provider]  methocarbamol (ROBAXIN) 500 MG tablet Take 500 mg by mouth daily.    [provider]  Multiple Vitamin (MULTIVITAMIN) tablet Take 1 tablet by mouth daily.    [provider]  norgestimate-ethinyl estradiol (ORTHO-CYCLEN,SPRINTEC,PREVIFEM) 0.25-35 MG-MCG tablet Take 1 tablet by mouth daily.    [provider]  oxyCODONE-acetaminophen (ROXICET) 5-325 MG per tablet Take 1 tablet by mouth every 4 (four) hours as needed for pain. 01/07/13   Lindwood Quaarnaghi, Michael, PA-C    vitamin B-12 (CYANOCOBALAMIN) 100 MCG tablet Take 100 mcg by mouth daily.    [provider]    Family History History reviewed. No pertinent family history.  Social History Social History   Tobacco Use  . Smoking status: Current Every Day Smoker    Packs/day: 1.00    Years: 16.00    Pack years: 16.00    Types: Cigarettes  . Smokeless tobacco: Never Used  Substance Use Topics  . Alcohol use: Yes    Comment: socially  . Drug use: No     Allergies   Morphine and related   Review of Systems Review of Systems  Constitutional: Negative for fever.  Skin: Positive for wound. Negative for color change.     Physical Exam Updated Vital Signs BP 103/74 (BP Location: Right Arm)   Pulse 95   Temp 98 F (36.7 C) (Oral)   Resp 18   Wt 74.8 kg   SpO2 98%   BMI 24.35 kg/m   Physical Exam Vitals signs and nursing note reviewed.  Constitutional:      General: She is not in acute distress.    Appearance: She is well-developed. She is not diaphoretic.  HENT:     Head: Normocephalic and atraumatic.     Comments: 6 staples in place in the right occipital scalp, mildly tender, no erythema or drainage, well-healing Eyes:     General: No scleral icterus.  Right eye: No discharge.        Left eye: No discharge.     Conjunctiva/sclera: Conjunctivae normal.  Pulmonary:     Effort: Pulmonary effort is normal. No respiratory distress.  Skin:    General: Skin is warm and dry.     Coloration: Skin is not pale.     Findings: No rash.  Neurological:     Mental Status: She is alert and oriented to person, place, and time.     Coordination: Coordination normal.  Psychiatric:        Behavior: Behavior normal.        Thought Content: Thought content normal.        Judgment: Judgment normal.      ED Treatments / Results  Labs (all labs ordered are listed, but only abnormal results are displayed) Labs Reviewed - No data to display  EKG None  Radiology No  results found.  Procedures .Suture Removal Date/Time: 04/10/2018 5:13 PM Performed by: Emi HolesLaw, Braxxton Stoudt M, PA-C Authorized by: Emi HolesLaw, Tynlee Bayle M, PA-C   Consent:    Consent obtained:  Verbal   Consent given by:  Patient   Risks discussed:  Bleeding, pain and wound separation   Alternatives discussed:  No treatment Location:    Location:  Head/neck   Head/neck location:  Scalp Procedure details:    Wound appearance:  No signs of infection, good wound healing and clean   Number of staples removed:  6 Post-procedure details:    Post-removal:  No dressing applied   Patient tolerance of procedure:  Tolerated well, no immediate complications   (including critical care time)  Medications Ordered in ED Medications - No data to display   Initial Impression / Assessment and Plan / ED Course  I have reviewed the triage vital signs and the nursing notes.  Pertinent labs & imaging results that were available during my care of the patient were reviewed by me and considered in my medical decision making (see chart for details).      Pt to ER for staple removal and wound check as above. Procedure tolerated well. Vitals normal, no signs of infection. Scar minimization & return precautions given at dc.  Patient understands and agrees with plan.  Patient vital stable throughout ED course and discharged in satisfactory condition.   Final Clinical Impressions(s) / ED Diagnoses   Final diagnoses:  Encounter for staple removal    ED Discharge Orders    None       Emi HolesLaw, Noel Henandez M, PA-C 04/10/18 1714    Tegeler, Canary Brimhristopher J, MD 04/10/18 [phone]

## 2018-04-10 NOTE — Discharge Instructions (Signed)
Wash wound with warm soapy water, but do not scrub hard.  Please return the emergency department or see your doctor if develop any increasing pain, redness, swelling, drainage, red streaking from the area, or fevers.  Try to keep area away from the sun to help minimize scarring.

## 2018-05-17 ENCOUNTER — Encounter (HOSPITAL_BASED_OUTPATIENT_CLINIC_OR_DEPARTMENT_OTHER): Payer: Self-pay

## 2018-05-17 ENCOUNTER — Emergency Department (HOSPITAL_BASED_OUTPATIENT_CLINIC_OR_DEPARTMENT_OTHER)
Admission: EM | Admit: 2018-05-17 | Discharge: 2018-05-17 | Disposition: A | Discharge status: Home / Self Care | Payer: Self-pay | Attending: Emergency Medicine | Admitting: Emergency Medicine

## 2018-05-17 ENCOUNTER — Other Ambulatory Visit: Payer: Self-pay

## 2018-05-17 DIAGNOSIS — R42 Dizziness and giddiness: Secondary | ICD-10-CM | POA: Insufficient documentation

## 2018-05-17 DIAGNOSIS — R0981 Nasal congestion: Secondary | ICD-10-CM | POA: Insufficient documentation

## 2018-05-17 DIAGNOSIS — F17210 Nicotine dependence, cigarettes, uncomplicated: Secondary | ICD-10-CM | POA: Insufficient documentation

## 2018-05-17 LAB — CBC WITH DIFFERENTIAL/PLATELET
Abs Immature Granulocytes: 0.02 10*3/uL (ref 0.00–0.07)
Basophils Absolute: 0.1 10*3/uL (ref 0.0–0.1)
Basophils Relative: 1 %
EOS PCT: 0 %
Eosinophils Absolute: 0 10*3/uL (ref 0.0–0.5)
HCT: 43.4 % (ref 36.0–46.0)
Hemoglobin: 14.1 g/dL (ref 12.0–15.0)
Immature Granulocytes: 0 %
Lymphocytes Relative: 8 %
Lymphs Abs: 0.6 10*3/uL — ABNORMAL LOW (ref 0.7–4.0)
MCH: 30.7 pg (ref 26.0–34.0)
MCHC: 32.5 g/dL (ref 30.0–36.0)
MCV: 94.6 fL (ref 80.0–100.0)
Monocytes Absolute: 0.4 10*3/uL (ref 0.1–1.0)
Monocytes Relative: 5 %
Neutro Abs: 6.7 10*3/uL (ref 1.7–7.7)
Neutrophils Relative %: 86 %
Platelets: 173 10*3/uL (ref 150–400)
RBC: 4.59 MIL/uL (ref 3.87–5.11)
RDW: 12.5 % (ref 11.5–15.5)
WBC: 7.7 10*3/uL (ref 4.0–10.5)
nRBC: 0 % (ref 0.0–0.2)

## 2018-05-17 LAB — PREGNANCY, URINE: Preg Test, Ur: NEGATIVE

## 2018-05-17 LAB — BASIC METABOLIC PANEL
ANION GAP: 6 (ref 5–15)
BUN: 10 mg/dL (ref 6–20)
CO2: 25 mmol/L (ref 22–32)
Calcium: 9.3 mg/dL (ref 8.9–10.3)
Chloride: 106 mmol/L (ref 98–111)
Creatinine, Ser: 0.84 mg/dL (ref 0.44–1.00)
GFR calc non Af Amer: >60 mL/min (ref >60)
Glucose, Bld: 131 mg/dL — ABNORMAL HIGH (ref 70–99)
Potassium: 3.5 mmol/L (ref 3.5–5.1)
SODIUM: 137 mmol/L (ref 135–145)

## 2018-05-17 MED ORDER — FLUTICASONE PROPIONATE 50 MCG/ACT NA SUSP: 2.0000 | Freq: Every day | NASAL | 0 refills | Status: DC

## 2018-05-17 MED ORDER — ONDANSETRON HCL 4 MG/2ML IJ SOLN
4.0000 mg | Freq: Once | INTRAMUSCULAR | Status: AC
  Administered 2018-05-17: 4 mg via INTRAVENOUS
  Filled 2018-05-17: qty 2

## 2018-05-17 MED ORDER — MECLIZINE HCL 25 MG PO TABS
25.0000 mg | ORAL_TABLET | Freq: Once | ORAL | Status: AC
  Administered 2018-05-17: 25 mg via ORAL
  Filled 2018-05-17: qty 1

## 2018-05-17 MED ORDER — SODIUM CHLORIDE 0.9 % IV BOLUS
500.0000 mL | Freq: Once | INTRAVENOUS | Status: AC
  Administered 2018-05-17: 500 mL via INTRAVENOUS

## 2018-05-17 MED ORDER — MECLIZINE HCL 25 MG PO TABS: 25.0000 mg | ORAL_TABLET | Freq: Three times a day (TID) | ORAL | 0 refills | Status: DC | PRN

## 2018-05-17 MED ORDER — KETOROLAC TROMETHAMINE 30 MG/ML IJ SOLN
30.0000 mg | Freq: Once | INTRAMUSCULAR | Status: AC
  Administered 2018-05-17: 30 mg via INTRAVENOUS
  Filled 2018-05-17: qty 1

## 2018-05-17 NOTE — ED Notes (Signed)
Attempted 2 IV sticks without success 

## 2018-05-17 NOTE — ED Notes (Signed)
Pt verbalizes understanding of d/c instructions and denies any further needs at this time. 

## 2018-05-17 NOTE — Discharge Instructions (Signed)
We believe your symptoms were caused by benign vertigo.  Please read through the included information and take any prescribed medication(s).  Follow up with your doctor as listed above.  If you develop any new or worsening symptoms that concern you, including but not limited to persistent dizziness/vertigo, numbness or weakness in your arms or legs, altered mental status, persistent vomiting, or fever greater than 101, please return immediately to the Emergency Department.  

## 2018-05-17 NOTE — ED Triage Notes (Signed)
Pt c/o dizziness intermittent x 6 days-seen by PCP 1/27-dx with sinus infection, fluid in ears rx abx and steroids-states dizziness and n/v are worse-NAD- to triage in w/c-mother with pt

## 2018-05-17 NOTE — ED Provider Notes (Signed)
Emergency Department Provider Note   I have reviewed the triage vital signs and the nursing notes.   HISTORY  Chief Complaint Dizziness   HPI MIMA EBBS is a 33 y.o. female with PMH of GERD presents to the emergency department with sinus congestion/pressure with associated vertigo symptoms.  Patient has had several days of sinus congestion.  She felt like her symptoms were improving after evaluation by her PCP.  She has had intermittent dizziness which she describes as "feeling off balance like you are wearing the wrong glasses."  She reports feeling like the room is shifting and moving.  Symptoms are worse with movement.  She is having some difficulty walking.  She denies any numbness or unilateral weakness.  No fevers or chills.  No tinnitus but is describing some ear pressure bilaterally.    Past Medical History:  Diagnosis Date  . GERD (gastroesophageal reflux disease)    no current med.  . Left knee pain 12/2012    There are no active problems to display for this patient.   Past Surgical History:  Procedure Laterality Date  . CESAREAN SECTION    . GASTRECTOMY     sleeve  . KNEE ARTHROSCOPY  04/02/2012   Procedure: ARTHROSCOPY KNEE;  Surgeon: Velna Ochs, MD;  Location: [facility];  Service: Orthopedics;  Laterality: Left;  left knee arthroscopy chondroplasty  . KNEE ARTHROSCOPY WITH LATERAL RELEASE Left 01/07/2013   Procedure: LEFT KNEE ARTHROSCOPY, CHONDROPLASTY AND  LATERAL RELEASE;  Surgeon: Velna Ochs, MD;  Location: [facility];  Service: Orthopedics;  Laterality: Left;    Allergies Morphine and related  No family history on file.  Social History Social History   Tobacco Use  . Smoking status: Current Every Day Smoker    Packs/day: 1.00    Years: 16.00    Pack years: 16.00    Types: Cigarettes  . Smokeless tobacco: Never Used  Substance Use Topics  . Alcohol use: Yes    Comment: socially  . Drug use: No     Review of Systems  Constitutional: No fever/chills. Positive fatigue.  Eyes: No visual changes. ENT: No sore throat. Positive sinus congestion and ear fullness.  Cardiovascular: Denies chest pain. Respiratory: Denies shortness of breath. Gastrointestinal: No abdominal pain. Positive nausea, no vomiting.  No diarrhea.  No constipation. Genitourinary: Negative for dysuria. Musculoskeletal: Negative for back pain. Skin: Negative for rash. Neurological: Negative for focal weakness or numbness. Positive mild HA.   10-point ROS otherwise negative.  ____________________________________________   PHYSICAL EXAM:  VITAL SIGNS: ED Triage Vitals  Enc Vitals Group     BP 05/17/18 1703 116/84     Pulse Rate 05/17/18 1703 73     Resp 05/17/18 1703 18     Temp 05/17/18 1703 (!) 97.5 F (36.4 C)     Temp Source 05/17/18 1703 Oral     SpO2 05/17/18 1703 100 %     Weight 05/17/18 1702 155 lb (70.3 kg)     Height 05/17/18 1702 5\' 9"  (1.753 m)     Pain Score 05/17/18 1700 3   Constitutional: Alert and oriented. Well appearing and in no acute distress. Eyes: Conjunctivae are normal. PERRL. EOMI. Head: Atraumatic. Ears:  Right TM effusion without erythema, bulging. Normal external canal. Mild dullness of the TM on the left without erythema. No mastoid tenderness.  Nose: No congestion/rhinnorhea. Mouth/Throat: Mucous membranes are moist.  Neck: No stridor.  Cardiovascular: Normal rate, regular rhythm. Good  peripheral circulation. Grossly normal heart sounds.   Respiratory: Normal respiratory effort.  No retractions. Lungs CTAB. Gastrointestinal: Soft and nontender. No distention.  Musculoskeletal: No lower extremity tenderness nor edema. No gross deformities of extremities. Neurologic:  Normal speech and language. No gross focal neurologic deficits are appreciated. Normal CN exam 2-12. Normal finger-to-nose. Normal heel-to-shin.  Skin:  Skin is warm, dry and intact. No rash  noted.  ____________________________________________   LABS (all labs ordered are listed, but only abnormal results are displayed)  Labs Reviewed  BASIC METABOLIC PANEL - Abnormal; Notable for the following components:      Result Value   Glucose, Bld 131 (*)    All other components within normal limits  CBC WITH DIFFERENTIAL/PLATELET - Abnormal; Notable for the following components:   Lymphs Abs 0.6 (*)    All other components within normal limits  PREGNANCY, URINE   ____________________________________________  RADIOLOGY  None ____________________________________________   PROCEDURES  Procedure(s) performed:   Procedures  None ____________________________________________   INITIAL IMPRESSION / ASSESSMENT AND PLAN / ED COURSE  Pertinent labs & imaging results that were available during my care of the patient were reviewed by me and considered in my medical decision making (see chart for details).  Patient presents with sinus congestion symptoms and vertigo.  My suspicion for central vertigo is exceedingly low.  The patient has a an effusion behind the right TM.  No evidence of otitis media.  No mastoid tenderness.  [name] neurologic exam is within normal limits including finger-to-nose, heel-to-shin.  Plan for screening labs to rule out or underlying electrolyte imbalance.  Plan for IV fluids and meclizine.  I anticipate symptomatic management along with PCP and ENT follow-up as needed.  06:45 PM Patient is feeling better after IV fluids and meclizine.  Labs reviewed with no acute findings.  Plan for Toradol and Zofran prior to discharge with some mild headache, face congestion symptoms along with nausea.  Continued very low suspicion for central cause of patient's vertigo given symptoms and reassessment.  Provided contact information for ear nose and throat.  Plan to discharge home with Flonase and meclizine to use as needed. Discussed ED return precautions in detail.   ____________________________________________  FINAL CLINICAL IMPRESSION(S) / ED DIAGNOSES  Final diagnoses:  Vertigo  Sinus congestion     MEDICATIONS GIVEN DURING THIS VISIT:  Medications  sodium chloride 0.9 % bolus 500 mL (500 mLs Intravenous New Bag/Given 05/17/18 1815)  ondansetron (ZOFRAN) injection 4 mg (has no administration in time range)  ketorolac (TORADOL) 30 MG/ML injection 30 mg (has no administration in time range)  meclizine (ANTIVERT) tablet 25 mg (25 mg Oral Given 05/17/18 1752)     NEW OUTPATIENT MEDICATIONS STARTED DURING THIS VISIT:  New Prescriptions   FLUTICASONE (FLONASE) 50 MCG/ACT NASAL SPRAY    Place 2 sprays into both nostrils daily for 7 days.   MECLIZINE (ANTIVERT) 25 MG TABLET    Take 1 tablet (25 mg total) by mouth 3 (three) times daily as needed for dizziness.    Note:  This document was prepared using Dragon voice recognition software and may include unintentional dictation errors.  Alona BeneJoshua Lucella Pommier, MD Emergency Medicine    Korin Hartwell, Arlyss RepressJoshua G, MD 05/17/18 [phone]

## 2019-01-16 ENCOUNTER — Emergency Department (HOSPITAL_BASED_OUTPATIENT_CLINIC_OR_DEPARTMENT_OTHER)
Admission: EM | Admit: 2019-01-16 | Discharge: 2019-01-16 | Disposition: A | Discharge status: Home / Self Care | Payer: Self-pay | Attending: Emergency Medicine | Admitting: Emergency Medicine

## 2019-01-16 ENCOUNTER — Encounter (HOSPITAL_BASED_OUTPATIENT_CLINIC_OR_DEPARTMENT_OTHER): Payer: Self-pay | Admitting: *Deleted

## 2019-01-16 ENCOUNTER — Other Ambulatory Visit: Payer: Self-pay

## 2019-01-16 DIAGNOSIS — F17210 Nicotine dependence, cigarettes, uncomplicated: Secondary | ICD-10-CM | POA: Insufficient documentation

## 2019-01-16 DIAGNOSIS — N12 Tubulo-interstitial nephritis, not specified as acute or chronic: Secondary | ICD-10-CM

## 2019-01-16 DIAGNOSIS — N200 Calculus of kidney: Secondary | ICD-10-CM | POA: Insufficient documentation

## 2019-01-16 LAB — COMPREHENSIVE METABOLIC PANEL
ALT: 11 U/L (ref 0–44)
AST: 12 U/L — ABNORMAL LOW (ref 15–41)
Albumin: 3.6 g/dL (ref 3.5–5.0)
Alkaline Phosphatase: 54 U/L (ref 38–126)
Anion gap: 8 (ref 5–15)
BUN: 8 mg/dL (ref 6–20)
CO2: 25 mmol/L (ref 22–32)
Calcium: 9.3 mg/dL (ref 8.9–10.3)
Chloride: 102 mmol/L (ref 98–111)
Creatinine, Ser: 0.74 mg/dL (ref 0.44–1.00)
GFR calc Af Amer: >60 mL/min (ref >60)
GFR calc non Af Amer: >60 mL/min (ref >60)
Glucose, Bld: 119 mg/dL — ABNORMAL HIGH (ref 70–99)
Potassium: 3.3 mmol/L — ABNORMAL LOW (ref 3.5–5.1)
Sodium: 135 mmol/L (ref 135–145)
Total Bilirubin: 0.9 mg/dL (ref 0.3–1.2)
Total Protein: 6.9 g/dL (ref 6.5–8.1)

## 2019-01-16 LAB — CBC WITH DIFFERENTIAL/PLATELET
Abs Immature Granulocytes: 0.03 10*3/uL (ref 0.00–0.07)
Basophils Absolute: 0 10*3/uL (ref 0.0–0.1)
Basophils Relative: 0 %
Eosinophils Absolute: 0.1 10*3/uL (ref 0.0–0.5)
Eosinophils Relative: 1 %
HCT: 43.5 % (ref 36.0–46.0)
Hemoglobin: 14.1 g/dL (ref 12.0–15.0)
Immature Granulocytes: 0 %
Lymphocytes Relative: 7 %
Lymphs Abs: 0.6 10*3/uL — ABNORMAL LOW (ref 0.7–4.0)
MCH: 32 pg (ref 26.0–34.0)
MCHC: 32.4 g/dL (ref 30.0–36.0)
MCV: 98.6 fL (ref 80.0–100.0)
Monocytes Absolute: 0.8 10*3/uL (ref 0.1–1.0)
Monocytes Relative: 10 %
Neutro Abs: 6.8 10*3/uL (ref 1.7–7.7)
Neutrophils Relative %: 82 %
Platelets: 155 10*3/uL (ref 150–400)
RBC: 4.41 MIL/uL (ref 3.87–5.11)
RDW: 13.5 % (ref 11.5–15.5)
WBC: 8.4 10*3/uL (ref 4.0–10.5)
nRBC: 0 % (ref 0.0–0.2)

## 2019-01-16 LAB — URINALYSIS, MICROSCOPIC (REFLEX)

## 2019-01-16 LAB — URINALYSIS, ROUTINE W REFLEX MICROSCOPIC
Bilirubin Urine: NEGATIVE
Glucose, UA: NEGATIVE mg/dL
Ketones, ur: NEGATIVE mg/dL
Leukocytes,Ua: NEGATIVE
Nitrite: NEGATIVE
Protein, ur: NEGATIVE mg/dL
Specific Gravity, Urine: 1.02 (ref 1.005–1.030)
pH: 6 (ref 5.0–8.0)

## 2019-01-16 LAB — LIPASE, BLOOD: Lipase: 18 U/L (ref 11–51)

## 2019-01-16 LAB — LACTIC ACID, PLASMA: Lactic Acid, Venous: 1.3 mmol/L (ref 0.5–1.9)

## 2019-01-16 MED ORDER — SODIUM CHLORIDE 0.9 % IV BOLUS
1000.0000 mL | Freq: Once | INTRAVENOUS | Status: AC
  Administered 2019-01-16: 1000 mL via INTRAVENOUS

## 2019-01-16 MED ORDER — POTASSIUM CHLORIDE CRYS ER 20 MEQ PO TBCR
20.0000 meq | EXTENDED_RELEASE_TABLET | Freq: Once | ORAL | Status: AC
  Administered 2019-01-16: 20 meq via ORAL
  Filled 2019-01-16: qty 1

## 2019-01-16 MED ORDER — SODIUM CHLORIDE 0.9 % IV SOLN
1.0000 g | Freq: Once | INTRAVENOUS | Status: AC
  Administered 2019-01-16: 1 g via INTRAVENOUS
  Filled 2019-01-16: qty 10

## 2019-01-16 NOTE — ED Notes (Signed)
Pt. Is here due to R side low back pain getting worse after 2 weeks of low back pain R to L.  Pt. Reports seeing her PMD yesterday and having UA done and CT scan.  Pt. Reports she was called by her PMD and prescribed motrin and abx. For infection and pain.     Pt. Took Ibuprofen at 10:00 this morning she reports to PA C.

## 2019-01-16 NOTE — Discharge Instructions (Signed)
Continue taking your antibiotic as prescribed - it is very important to finish the entire course We have sent your urine for culture - you will receive a call in 2-3 days time if the antibiotic needs to be changed Please follow up with your PCP in the morning regarding your ED visit today Return to the ED immediately for any worsening symptoms including worsening pain, vomiting, fevers > 100.4, difficulty urinating

## 2019-01-16 NOTE — ED Triage Notes (Signed)
Per pt: Pt was seen by PCP yesterday due to 2 weeks of lower back pain.  SHe was evaluated for UTI and the UA was negative for UTI, pt then had a CT which showed "inflamed kidney". Pt was called and advised to come here due to CT results.

## 2019-01-16 NOTE — ED Provider Notes (Signed)
[facility] EMERGENCY DEPARTMENT Provider Note   CSN: [id number] Arrival date & time: 01/16/19  1402     History   Chief Complaint Chief Complaint  Patient presents with  . Urinary Tract Infection    HPI Patricia Mcmahon is a 33 y.o. female who presents to the ED from PCP's office for further evaluation.  She reports that for 2 weeks she has had gradual onset, intermittent, right flank pain.  Seen by her PCPs office yesterday and had blood work done as well as a CT abdomen pelvis given she was mildly tender in the right lower quadrant.  They were concern for appendicitis at that time.  She was called this morning and told to come to the ED for further evaluation.   Per Care Everywhere - CT A/P 09/30 with findings: IMPRESSION: 1. Focal areas of low attenuation within the upper pole of right kidney with mild surrounding fat stranding concerning for pyelonephritis and possible lobar nephronia. No kidney abscess identified. 2. No evidence for acute appendicitis. 3. Gallstones.   She reports that they tested her urine which was clear and without infection.  She was discharged home on Cipro yesterday.  Been compliant with medication. Does endorse some subjective fever and chills.  Is been taking ibuprofen as needed.  Has nausea, vomiting, dysuria, urinary frequency, vaginal discharge, pelvic pain, any other associated symptoms.  The history is provided by the patient and medical records.    Past Medical History:  Diagnosis Date  . GERD (gastroesophageal reflux disease)    no current med.  . Left knee pain 12/2012    There are no active problems to display for this patient.   Past Surgical History:  Procedure Laterality Date  . CESAREAN SECTION    . GASTRECTOMY     sleeve  . KNEE ARTHROSCOPY  04/02/2012   Procedure: ARTHROSCOPY KNEE;  Surgeon: Velna OchsPeter G Dalldorf, MD;  Location: [facility];  Service: Orthopedics;  Laterality: Left;  left knee arthroscopy  chondroplasty  . KNEE ARTHROSCOPY WITH LATERAL RELEASE Left 01/07/2013   Procedure: LEFT KNEE ARTHROSCOPY, CHONDROPLASTY AND  LATERAL RELEASE;  Surgeon: Velna OchsPeter G Dalldorf, MD;  Location: [facility];  Service: Orthopedics;  Laterality: Left;     OB History   No obstetric history on file.      Home Medications    Prior to Admission medications   Medication Sig Start Date End Date Taking? Authorizing Provider  ciprofloxacin (CIPRO) 500 MG tablet Take 500 mg by mouth 2 (two) times daily.   Yes [provider]  levonorgestrel (MIRENA) 20 MCG/24HR IUD 1 each by Intrauterine route once.   Yes [provider]    Family History No family history on file.  Social History Social History   Tobacco Use  . Smoking status: Current Every Day Smoker    Packs/day: 1.00    Years: 16.00    Pack years: 16.00    Types: Cigarettes  . Smokeless tobacco: Never Used  Substance Use Topics  . Alcohol use: Yes    Comment: socially  . Drug use: No     Allergies   Morphine and related   Review of Systems Review of Systems  Constitutional: Positive for chills and fever (subjective).  HENT: Negative for congestion.   Eyes: Negative for visual disturbance.  Respiratory: Negative for cough and shortness of breath.   Cardiovascular: Negative for chest pain.  Gastrointestinal: Negative for abdominal pain, constipation, diarrhea and nausea.  Genitourinary:  Positive for flank pain. Negative for difficulty urinating, dysuria, frequency, pelvic pain, urgency, vaginal bleeding and vaginal discharge.  Musculoskeletal: Negative for myalgias.  Skin: Negative for rash.  Neurological: Negative for headaches.     Physical Exam Updated Vital Signs BP 107/70   Pulse (!) 117   Temp 97.8 F (36.6 C) (Oral)   Resp 16   SpO2 99%   Physical Exam Vitals signs and nursing note reviewed.  Constitutional:      Appearance: She is not ill-appearing or diaphoretic.  HENT:      Head: Normocephalic and atraumatic.  Eyes:     Conjunctiva/sclera: Conjunctivae normal.  Neck:     Musculoskeletal: Neck supple.  Cardiovascular:     Rate and Rhythm: Normal rate and regular rhythm.     Pulses: Normal pulses.  Pulmonary:     Effort: Pulmonary effort is normal.     Breath sounds: Normal breath sounds. No wheezing, rhonchi or rales.  Abdominal:     General: Abdomen is flat.     Palpations: Abdomen is soft.     Tenderness: There is no abdominal tenderness. There is right CVA tenderness. There is no left CVA tenderness, guarding or rebound.     Comments: Soft, NTND, +BS throughout, no r/g/r, neg murphy's, neg mcburney's, + right CVA tenderness   Skin:    General: Skin is warm and dry.  Neurological:     Mental Status: She is alert.      ED Treatments / Results  Labs (all labs ordered are listed, but only abnormal results are displayed) Labs Reviewed  COMPREHENSIVE METABOLIC PANEL - Abnormal; Notable for the following components:      Result Value   Potassium 3.3 (*)    Glucose, Bld 119 (*)    AST 12 (*)    All other components within normal limits  CBC WITH DIFFERENTIAL/PLATELET - Abnormal; Notable for the following components:   Lymphs Abs 0.6 (*)    All other components within normal limits  URINALYSIS, ROUTINE W REFLEX MICROSCOPIC - Abnormal; Notable for the following components:   APPearance HAZY (*)    Hgb urine dipstick TRACE (*)    All other components within normal limits  URINALYSIS, MICROSCOPIC (REFLEX) - Abnormal; Notable for the following components:   Bacteria, UA FEW (*)    All other components within normal limits  URINE CULTURE  LIPASE, BLOOD  LACTIC ACID, PLASMA  LACTIC ACID, PLASMA    EKG None  Radiology No results found.  Procedures Procedures (including critical care time)  Medications Ordered in ED Medications  sodium chloride 0.9 % bolus 1,000 mL (0 mLs Intravenous Stopped 01/16/19 1715)  cefTRIAXone (ROCEPHIN) 1 g in  sodium chloride 0.9 % 100 mL IVPB (0 g Intravenous Stopped 01/16/19 1643)  potassium chloride SA (KLOR-CON) CR tablet 20 mEq (20 mEq Oral Given 01/16/19 1643)     Initial Impression / Assessment and Plan / ED Course  I have reviewed the triage vital signs and the nursing notes.  Pertinent labs & imaging results that were available during my care of the patient were reviewed by me and considered in my medical decision making (see chart for details).    Sent over from PCPs office after having CT abdomen pelvis done yesterday.  There were findings consistent with pyelonephritis with concern for lobar nephronia.  No definitive renal abscess identified. No appendicitis.  Pt tachycardic on arrival but without fever.  Blood pressure stable.  No tachypnea.  Do not feel she  needs repeat imaging today as CT was done just yesterday.  Will repeat blood work today including a lactic acid.  Will send urine for culture.  Will give IV ceftriaxone in the ED.  Will reevaluate after labs return.   Labwork reassuring.  No leukocytosis.  Mild hypokalemia at 3.3 which was repleted in the ED.  No other electrolyte derangements.  Creatinine within normal limits.  No increase in LFTs. Lactic acid within normal limits.  Lipase negative.  Urinalysis does show 6-10 white blood cells, 6-10 red blood cells, 11-20 squamous epithelial.   On reevaluation patient appears comfortable.  She has been laughing with her boyfriend at bedside.  Lab work is very reassuring today without signs of sepsis will discharge home at this time.  Patient advised to continue taking Cipro as prescribed.  We have sent urine for culture and she will receive a call if Coralyn Mark is resistant to Cipro.  I have updated her on this plan.  She is advised to follow-up with her PCP in the morning given ED visit today.  Strict return precautions have been discussed with patient.  She is in agreement with plan at this time and stable for discharge home.   This note was  prepared using Dragon voice recognition software and may include unintentional dictation errors due to the inherent limitations of voice recognition software.      Final Clinical Impressions(s) / ED Diagnoses   Final diagnoses:  Pyelonephritis    ED Discharge Orders    None       Eustaquio Maize, PA-C 01/16/19 1840    Malvin Johns, MD 01/16/19 2111

## 2019-01-17 LAB — URINE CULTURE: Culture: NO GROWTH

## 2019-11-06 DIAGNOSIS — Z0000 Encounter for general adult medical examination without abnormal findings: Secondary | ICD-10-CM | POA: Diagnosis not present

## 2019-11-06 DIAGNOSIS — Z124 Encounter for screening for malignant neoplasm of cervix: Secondary | ICD-10-CM | POA: Diagnosis not present

## 2019-11-06 DIAGNOSIS — Z1151 Encounter for screening for human papillomavirus (HPV): Secondary | ICD-10-CM | POA: Diagnosis not present

## 2019-11-06 DIAGNOSIS — R8290 Unspecified abnormal findings in urine: Secondary | ICD-10-CM | POA: Diagnosis not present

## 2019-11-07 DIAGNOSIS — Z30433 Encounter for removal and reinsertion of intrauterine contraceptive device: Secondary | ICD-10-CM | POA: Diagnosis not present

## 2019-11-07 DIAGNOSIS — Z3049 Encounter for surveillance of other contraceptives: Secondary | ICD-10-CM | POA: Diagnosis not present

## 2020-10-02 ENCOUNTER — Other Ambulatory Visit: Payer: Self-pay

## 2020-10-02 DIAGNOSIS — N12 Tubulo-interstitial nephritis, not specified as acute or chronic: Secondary | ICD-10-CM | POA: Diagnosis not present

## 2020-10-02 DIAGNOSIS — E860 Dehydration: Secondary | ICD-10-CM | POA: Diagnosis not present

## 2020-10-02 DIAGNOSIS — F17210 Nicotine dependence, cigarettes, uncomplicated: Secondary | ICD-10-CM | POA: Diagnosis not present

## 2020-10-02 DIAGNOSIS — R109 Unspecified abdominal pain: Secondary | ICD-10-CM | POA: Diagnosis present

## 2020-10-03 ENCOUNTER — Emergency Department (HOSPITAL_BASED_OUTPATIENT_CLINIC_OR_DEPARTMENT_OTHER)
Admission: EM | Admit: 2020-10-03 | Discharge: 2020-10-03 | Disposition: A | Discharge status: Home / Self Care | Payer: Medicaid Other | Attending: Emergency Medicine | Admitting: Emergency Medicine

## 2020-10-03 ENCOUNTER — Encounter (HOSPITAL_BASED_OUTPATIENT_CLINIC_OR_DEPARTMENT_OTHER): Payer: Self-pay | Admitting: *Deleted

## 2020-10-03 DIAGNOSIS — N12 Tubulo-interstitial nephritis, not specified as acute or chronic: Secondary | ICD-10-CM

## 2020-10-03 DIAGNOSIS — E860 Dehydration: Secondary | ICD-10-CM

## 2020-10-03 LAB — CBC
HCT: 48.2 % — ABNORMAL HIGH (ref 36.0–46.0)
Hemoglobin: 17.5 g/dL — ABNORMAL HIGH (ref 12.0–15.0)
MCH: 32.9 pg (ref 26.0–34.0)
MCHC: 36.3 g/dL — ABNORMAL HIGH (ref 30.0–36.0)
MCV: 90.6 fL (ref 80.0–100.0)
Platelets: 118 10*3/uL — ABNORMAL LOW (ref 150–400)
RBC: 5.32 MIL/uL — ABNORMAL HIGH (ref 3.87–5.11)
RDW: 12.3 % (ref 11.5–15.5)
WBC: 7.7 10*3/uL (ref 4.0–10.5)
nRBC: 0 % (ref 0.0–0.2)

## 2020-10-03 LAB — URINALYSIS, ROUTINE W REFLEX MICROSCOPIC
Glucose, UA: NEGATIVE mg/dL
Ketones, ur: >80 mg/dL — AB
Nitrite: NEGATIVE
Protein, ur: 100 mg/dL — AB
Specific Gravity, Urine: 1.02 (ref 1.005–1.030)
pH: 6 (ref 5.0–8.0)

## 2020-10-03 LAB — COMPREHENSIVE METABOLIC PANEL
ALT: 45 U/L — ABNORMAL HIGH (ref 0–44)
AST: 20 U/L (ref 15–41)
Albumin: 3.6 g/dL (ref 3.5–5.0)
Alkaline Phosphatase: 92 U/L (ref 38–126)
Anion gap: 11 (ref 5–15)
BUN: 8 mg/dL (ref 6–20)
CO2: 19 mmol/L — ABNORMAL LOW (ref 22–32)
Calcium: 9.9 mg/dL (ref 8.9–10.3)
Chloride: 98 mmol/L (ref 98–111)
Creatinine, Ser: 0.71 mg/dL (ref 0.44–1.00)
GFR, Estimated: >60 mL/min (ref >60)
Glucose, Bld: 118 mg/dL — ABNORMAL HIGH (ref 70–99)
Potassium: 3.5 mmol/L (ref 3.5–5.1)
Sodium: 128 mmol/L — ABNORMAL LOW (ref 135–145)
Total Bilirubin: 1.3 mg/dL — ABNORMAL HIGH (ref 0.3–1.2)
Total Protein: 7.4 g/dL (ref 6.5–8.1)

## 2020-10-03 LAB — URINALYSIS, MICROSCOPIC (REFLEX)

## 2020-10-03 LAB — PREGNANCY, URINE: Preg Test, Ur: NEGATIVE

## 2020-10-03 MED ORDER — PROCHLORPERAZINE EDISYLATE 10 MG/2ML IJ SOLN
10.0000 mg | Freq: Once | INTRAMUSCULAR | Status: AC
  Administered 2020-10-03: 10 mg via INTRAVENOUS
  Filled 2020-10-03: qty 2

## 2020-10-03 MED ORDER — KETOROLAC TROMETHAMINE 15 MG/ML IJ SOLN: 15.0000 mg | Freq: Once | INTRAMUSCULAR | Status: DC

## 2020-10-03 MED ORDER — SODIUM CHLORIDE 0.9 % IV SOLN
1.0000 g | INTRAVENOUS | Status: DC
  Administered 2020-10-03: 1 g via INTRAVENOUS
  Filled 2020-10-03: qty 10

## 2020-10-03 MED ORDER — SODIUM CHLORIDE 0.9 % IV BOLUS
1000.0000 mL | Freq: Once | INTRAVENOUS | Status: AC
  Administered 2020-10-03: 1000 mL via INTRAVENOUS

## 2020-10-03 MED ORDER — CEPHALEXIN 500 MG PO CAPS: 500.0000 mg | ORAL_CAPSULE | Freq: Four times a day (QID) | ORAL | 0 refills | Status: AC

## 2020-10-03 MED ORDER — ONDANSETRON 4 MG PO TBDP: 4.0000 mg | ORAL_TABLET | Freq: Three times a day (TID) | ORAL | 0 refills | Status: DC | PRN

## 2020-10-03 MED ORDER — DIPHENHYDRAMINE HCL 50 MG/ML IJ SOLN
25.0000 mg | Freq: Once | INTRAMUSCULAR | Status: AC
  Administered 2020-10-03: 25 mg via INTRAVENOUS
  Filled 2020-10-03: qty 1

## 2020-10-03 MED ORDER — KETOROLAC TROMETHAMINE 15 MG/ML IJ SOLN
15.0000 mg | Freq: Once | INTRAMUSCULAR | Status: AC
  Administered 2020-10-03: 15 mg via INTRAVENOUS
  Filled 2020-10-03: qty 1

## 2020-10-03 MED ORDER — ONDANSETRON HCL 4 MG/2ML IJ SOLN
4.0000 mg | Freq: Once | INTRAMUSCULAR | Status: AC
  Administered 2020-10-03: 4 mg via INTRAVENOUS
  Filled 2020-10-03: qty 2

## 2020-10-03 NOTE — ED Triage Notes (Signed)
Pt reports urinary frequency since Monday. She has a [facility] visit yesterday and they started her on a new antibiotic but now she feels worse. Reports Pain in her back and fever today

## 2020-10-03 NOTE — ED Notes (Signed)
ED Provider at bedside. 

## 2020-10-03 NOTE — Discharge Instructions (Addendum)
You were evaluated in the Emergency Department and after careful evaluation, we did not find any emergent condition requiring admission or further testing in the hospital.  Your exam/testing today was overall reassuring.  Symptoms seem to be due to a kidney infection.  Please continue to hydrate at home.  Take the Keflex antibiotic as directed.  Can use the Zofran medicine as needed for nausea.  Please return to the Emergency Department if you experience any worsening of your condition.  Thank you for allowing [place] to be a part of your care.

## 2020-10-03 NOTE — ED Provider Notes (Signed)
[facility]-EMERGENCY DEPT [facility] Emergency Department Provider Note MRN:  [id number]  Arrival date & time: 10/03/20     Chief Complaint   Urinary Frequency   History of Present Illness   Patricia Mcmahon is a 35 y.o. year-old female with a history of pyelonephritis presenting to the ED with chief complaint of flank pain.  Patient began experiencing dysuria about 2 or 3 days ago.  She was able to obtain a prescription for ciprofloxacin using [facility].  Took it all day yesterday but feeling worse.  Began feeling fevers and flank pain.  Persistent nausea and vomiting, unable to eat or drink.  Denies any chest pain or shortness of breath, no numbness or weakness to the arms or legs.  Symptoms are moderate to severe, constant, no exacerbating or alleviating factors.  Review of Systems  A complete 10 system review of systems was obtained and all systems are negative except as noted in the HPI and PMH.   [name] Health History    Past Medical History:  Diagnosis Date   GERD (gastroesophageal reflux disease)    no current med.   Left knee pain 12/2012    Past Surgical History:  Procedure Laterality Date   CESAREAN SECTION     GASTRECTOMY     sleeve   KNEE ARTHROSCOPY  04/02/2012   Procedure: ARTHROSCOPY KNEE;  Surgeon: Velna Ochs, MD;  Location: [facility];  Service: Orthopedics;  Laterality: Left;  left knee arthroscopy chondroplasty   KNEE ARTHROSCOPY WITH LATERAL RELEASE Left 01/07/2013   Procedure: LEFT KNEE ARTHROSCOPY, CHONDROPLASTY AND  LATERAL RELEASE;  Surgeon: Velna Ochs, MD;  Location: [facility];  Service: Orthopedics;  Laterality: Left;    History reviewed. No pertinent family history.  Social History   Socioeconomic History   Marital status: Married    Spouse name: Not on file   Number of children: Not on file   Years of education: Not on file   Highest education level: Not on file  Occupational History   Not on file   Tobacco Use   Smoking status: Every Day    Packs/day: 1.00    Years: 16.00    Pack years: 16.00    Types: Cigarettes   Smokeless tobacco: Never  Vaping Use   Vaping Use: Never used  Substance and Sexual Activity   Alcohol use: Yes    Comment: socially   Drug use: No   Sexual activity: Not on file  Other Topics Concern   Not on file  Social History Narrative   Not on file   Social Determinants of Health   Financial Resource Strain: Not on file  Food Insecurity: Not on file  Transportation Needs: Not on file  Physical Activity: Not on file  Stress: Not on file  Social Connections: Not on file  Intimate Partner Violence: Not on file     Physical Exam   Vitals:   10/03/20 0245 10/03/20 0300  BP: (!) 85/58 94/68  Pulse: 94 84  Resp: 17 16  Temp:    SpO2: 97% 98%    CONSTITUTIONAL: Well-appearing, NAD NEURO:  Alert and oriented x 3, no focal deficits EYES:  eyes equal and reactive ENT/NECK:  no LAD, no JVD CARDIO: Regular rate, well-perfused, normal S1 and S2 PULM:  CTAB no wheezing or rhonchi GI/GU:  normal bowel sounds, non-distended, non-tender MSK/SPINE:  No gross deformities, no edema SKIN:  no rash, atraumatic PSYCH:  Appropriate speech and behavior  *  Additional and/or pertinent findings included in MDM below  Diagnostic and Interventional Summary    EKG Interpretation  Date/Time:    Ventricular Rate:    PR Interval:    QRS Duration:   QT Interval:    QTC Calculation:   R Axis:     Text Interpretation:          Labs Reviewed  URINALYSIS, ROUTINE W REFLEX MICROSCOPIC - Abnormal; Notable for the following components:      Result Value   APPearance CLOUDY (*)    Hgb urine dipstick TRACE (*)    Bilirubin Urine MODERATE (*)    Ketones, ur >80 (*)    Protein, ur 100 (*)    Leukocytes,Ua TRACE (*)    All other components within normal limits  CBC - Abnormal; Notable for the following components:   RBC 5.32 (*)    Hemoglobin 17.5 (*)    HCT  48.2 (*)    MCHC 36.3 (*)    Platelets 118 (*)    All other components within normal limits  COMPREHENSIVE METABOLIC PANEL - Abnormal; Notable for the following components:   Sodium 128 (*)    CO2 19 (*)    Glucose, Bld 118 (*)    ALT 45 (*)    Total Bilirubin 1.3 (*)    All other components within normal limits  URINALYSIS, MICROSCOPIC (REFLEX) - Abnormal; Notable for the following components:   Bacteria, UA MANY (*)    Non Squamous Epithelial PRESENT (*)    All other components within normal limits  PREGNANCY, URINE    No orders to display    Medications  cefTRIAXone (ROCEPHIN) 1 g in sodium chloride 0.9 % 100 mL IVPB (0 g Intravenous Stopped 10/03/20 0156)  sodium chloride 0.9 % bolus 1,000 mL (0 mLs Intravenous Stopped 10/03/20 0300)  ketorolac (TORADOL) 15 MG/ML injection 15 mg (15 mg Intravenous Given 10/03/20 0126)  ondansetron (ZOFRAN) injection 4 mg (4 mg Intravenous Given 10/03/20 0133)  diphenhydrAMINE (BENADRYL) injection 25 mg (25 mg Intravenous Given 10/03/20 0251)  prochlorperazine (COMPAZINE) injection 10 mg (10 mg Intravenous Given 10/03/20 0251)  sodium chloride 0.9 % bolus 1,000 mL (1,000 mLs Intravenous New Bag/Given 10/03/20 0259)     Procedures  /  Critical Care Procedures  ED Course and Medical Decision Making  I have reviewed the triage vital signs, the nursing notes, and pertinent available records from the EMR.  Listed above are laboratory and imaging tests that I personally ordered, reviewed, and interpreted and then considered in my medical decision making (see below for details).  History seems consistent with pyelonephritis.  Doubt kidney stone.  Likely dehydrated, which is supported by laboratory evaluation.  Patient feeling much better after fluids, symptomatic management.  Provided with IV antibiotics.  Appropriate for discharge.       Elmer Sow. Pilar Plate, MD [facility] Emergency Medicine [facility] [email or web address]  Final  Clinical Impressions(s) / ED Diagnoses     ICD-10-CM   1. Pyelonephritis  N12     2. Dehydration  E86.0       ED Discharge Orders          Ordered    cephALEXin (KEFLEX) 500 MG capsule  4 times daily        10/03/20 0400    ondansetron (ZOFRAN ODT) 4 MG disintegrating tablet  Every 8 hours PRN        10/03/20 0401  Discharge Instructions Discussed with and Provided to Patient:    Discharge Instructions      You were evaluated in the Emergency Department and after careful evaluation, we did not find any emergent condition requiring admission or further testing in the hospital.  Your exam/testing today was overall reassuring.  Symptoms seem to be due to a kidney infection.  Please continue to hydrate at home.  Take the Keflex antibiotic as directed.  Can use the Zofran medicine as needed for nausea.  Please return to the Emergency Department if you experience any worsening of your condition.  Thank you for allowing [place] to be a part of your care.        Sabas Sous, MD 10/03/20 [phone]

## 2021-01-10 ENCOUNTER — Other Ambulatory Visit: Payer: Self-pay

## 2021-01-10 ENCOUNTER — Inpatient Hospital Stay (HOSPITAL_BASED_OUTPATIENT_CLINIC_OR_DEPARTMENT_OTHER)
Admission: EM | Admit: 2021-01-10 | Discharge: 2021-01-14 | DRG: 389 | Disposition: A | Discharge status: Home / Self Care | Payer: BC Managed Care – PPO | Attending: Surgery | Admitting: Surgery

## 2021-01-10 ENCOUNTER — Emergency Department (HOSPITAL_BASED_OUTPATIENT_CLINIC_OR_DEPARTMENT_OTHER): Payer: BC Managed Care – PPO

## 2021-01-10 ENCOUNTER — Encounter (HOSPITAL_BASED_OUTPATIENT_CLINIC_OR_DEPARTMENT_OTHER): Payer: Self-pay

## 2021-01-10 DIAGNOSIS — F17210 Nicotine dependence, cigarettes, uncomplicated: Secondary | ICD-10-CM | POA: Diagnosis present

## 2021-01-10 DIAGNOSIS — Z9884 Bariatric surgery status: Secondary | ICD-10-CM | POA: Diagnosis not present

## 2021-01-10 DIAGNOSIS — Z20822 Contact with and (suspected) exposure to covid-19: Secondary | ICD-10-CM | POA: Diagnosis not present

## 2021-01-10 DIAGNOSIS — K561 Intussusception: Secondary | ICD-10-CM | POA: Diagnosis not present

## 2021-01-10 DIAGNOSIS — D649 Anemia, unspecified: Secondary | ICD-10-CM | POA: Diagnosis present

## 2021-01-10 DIAGNOSIS — Z975 Presence of (intrauterine) contraceptive device: Secondary | ICD-10-CM | POA: Diagnosis not present

## 2021-01-10 DIAGNOSIS — K8020 Calculus of gallbladder without cholecystitis without obstruction: Secondary | ICD-10-CM | POA: Diagnosis present

## 2021-01-10 DIAGNOSIS — N390 Urinary tract infection, site not specified: Secondary | ICD-10-CM | POA: Diagnosis present

## 2021-01-10 DIAGNOSIS — R109 Unspecified abdominal pain: Secondary | ICD-10-CM | POA: Diagnosis not present

## 2021-01-10 DIAGNOSIS — R1084 Generalized abdominal pain: Secondary | ICD-10-CM | POA: Diagnosis not present

## 2021-01-10 DIAGNOSIS — Z885 Allergy status to narcotic agent status: Secondary | ICD-10-CM | POA: Diagnosis not present

## 2021-01-10 DIAGNOSIS — E162 Hypoglycemia, unspecified: Secondary | ICD-10-CM | POA: Diagnosis not present

## 2021-01-10 LAB — CBC WITH DIFFERENTIAL/PLATELET
Abs Immature Granulocytes: 0.08 10*3/uL — ABNORMAL HIGH (ref 0.00–0.07)
Basophils Absolute: 0.1 10*3/uL (ref 0.0–0.1)
Basophils Relative: 0 %
Eosinophils Absolute: 0 10*3/uL (ref 0.0–0.5)
Eosinophils Relative: 0 %
HCT: 41.7 % (ref 36.0–46.0)
Hemoglobin: 14.3 g/dL (ref 12.0–15.0)
Immature Granulocytes: 0 %
Lymphocytes Relative: 4 %
Lymphs Abs: 0.7 10*3/uL (ref 0.7–4.0)
MCH: 32.6 pg (ref 26.0–34.0)
MCHC: 34.3 g/dL (ref 30.0–36.0)
MCV: 95.2 fL (ref 80.0–100.0)
Monocytes Absolute: 1.1 10*3/uL — ABNORMAL HIGH (ref 0.1–1.0)
Monocytes Relative: 5 %
Neutro Abs: 18 10*3/uL — ABNORMAL HIGH (ref 1.7–7.7)
Neutrophils Relative %: 91 %
Platelets: 157 10*3/uL (ref 150–400)
RBC: 4.38 MIL/uL (ref 3.87–5.11)
RDW: 12.8 % (ref 11.5–15.5)
WBC: 19.9 10*3/uL — ABNORMAL HIGH (ref 4.0–10.5)
nRBC: 0 % (ref 0.0–0.2)

## 2021-01-10 LAB — URINALYSIS, ROUTINE W REFLEX MICROSCOPIC
Bilirubin Urine: NEGATIVE
Glucose, UA: NEGATIVE mg/dL
Ketones, ur: 5 mg/dL — AB
Nitrite: NEGATIVE
Protein, ur: 30 mg/dL — AB
Specific Gravity, Urine: 1.03 (ref 1.005–1.030)
pH: 6 (ref 5.0–8.0)

## 2021-01-10 LAB — COMPREHENSIVE METABOLIC PANEL
ALT: 11 U/L (ref 0–44)
AST: 12 U/L — ABNORMAL LOW (ref 15–41)
Albumin: 3.8 g/dL (ref 3.5–5.0)
Alkaline Phosphatase: 55 U/L (ref 38–126)
Anion gap: 9 (ref 5–15)
BUN: 10 mg/dL (ref 6–20)
CO2: 25 mmol/L (ref 22–32)
Calcium: 9.7 mg/dL (ref 8.9–10.3)
Chloride: 101 mmol/L (ref 98–111)
Creatinine, Ser: 0.76 mg/dL (ref 0.44–1.00)
GFR, Estimated: >60 mL/min (ref >60)
Glucose, Bld: 109 mg/dL — ABNORMAL HIGH (ref 70–99)
Potassium: 3.5 mmol/L (ref 3.5–5.1)
Sodium: 135 mmol/L (ref 135–145)
Total Bilirubin: 1.5 mg/dL — ABNORMAL HIGH (ref 0.3–1.2)
Total Protein: 7 g/dL (ref 6.5–8.1)

## 2021-01-10 LAB — RESP PANEL BY RT-PCR (FLU A&B, COVID) ARPGX2
Influenza A by PCR: NEGATIVE
Influenza B by PCR: NEGATIVE
SARS Coronavirus 2 by RT PCR: NEGATIVE

## 2021-01-10 LAB — URINALYSIS, MICROSCOPIC (REFLEX)

## 2021-01-10 LAB — PREGNANCY, URINE: Preg Test, Ur: NEGATIVE

## 2021-01-10 LAB — LIPASE, BLOOD: Lipase: 23 U/L (ref 11–51)

## 2021-01-10 LAB — LACTIC ACID, PLASMA: Lactic Acid, Venous: 1 mmol/L (ref 0.5–1.9)

## 2021-01-10 MED ORDER — ONDANSETRON HCL 4 MG/2ML IJ SOLN
4.0000 mg | Freq: Once | INTRAMUSCULAR | Status: AC
  Administered 2021-01-10: 4 mg via INTRAVENOUS
  Filled 2021-01-10: qty 2

## 2021-01-10 MED ORDER — OXYCODONE HCL 5 MG PO TABS
5.0000 mg | ORAL_TABLET | ORAL | Status: DC | PRN
  Administered 2021-01-10: 10 mg via ORAL
  Filled 2021-01-10: qty 2

## 2021-01-10 MED ORDER — ENOXAPARIN SODIUM 40 MG/0.4ML IJ SOSY
40.0000 mg | PREFILLED_SYRINGE | INTRAMUSCULAR | Status: DC
  Administered 2021-01-12 – 2021-01-13 (×2): 40 mg via SUBCUTANEOUS
  Filled 2021-01-10 (×3): qty 0.4

## 2021-01-10 MED ORDER — FENTANYL CITRATE PF 50 MCG/ML IJ SOSY
12.5000 ug | PREFILLED_SYRINGE | INTRAMUSCULAR | Status: DC | PRN
  Administered 2021-01-10: 25 ug via INTRAVENOUS
  Filled 2021-01-10: qty 1

## 2021-01-10 MED ORDER — HYDROMORPHONE HCL 1 MG/ML IJ SOLN
0.5000 mg | INTRAMUSCULAR | Status: DC | PRN
  Administered 2021-01-11 – 2021-01-14 (×2): 1 mg via INTRAVENOUS
  Filled 2021-01-10: qty 2
  Filled 2021-01-10 (×2): qty 1

## 2021-01-10 MED ORDER — SODIUM CHLORIDE 0.9 % IV BOLUS
1000.0000 mL | Freq: Once | INTRAVENOUS | Status: AC
  Administered 2021-01-10: 1000 mL via INTRAVENOUS

## 2021-01-10 MED ORDER — MELATONIN 3 MG PO TABS
3.0000 mg | ORAL_TABLET | Freq: Every evening | ORAL | Status: DC | PRN
  Filled 2021-01-10: qty 1

## 2021-01-10 MED ORDER — METHOCARBAMOL 1000 MG/10ML IJ SOLN
500.0000 mg | Freq: Three times a day (TID) | INTRAVENOUS | Status: DC | PRN
  Filled 2021-01-10: qty 5

## 2021-01-10 MED ORDER — SODIUM CHLORIDE 0.9 % IV SOLN
1.0000 g | Freq: Once | INTRAVENOUS | Status: AC
  Administered 2021-01-10: 1 g via INTRAVENOUS
  Filled 2021-01-10: qty 10

## 2021-01-10 MED ORDER — DICYCLOMINE HCL 10 MG/ML IM SOLN
20.0000 mg | Freq: Once | INTRAMUSCULAR | Status: AC
  Administered 2021-01-10: 20 mg via INTRAMUSCULAR
  Filled 2021-01-10: qty 2

## 2021-01-10 MED ORDER — LACTATED RINGERS IV SOLN: INTRAVENOUS | Status: DC

## 2021-01-10 MED ORDER — SODIUM CHLORIDE 0.9 % IV SOLN: INTRAVENOUS | Status: DC

## 2021-01-10 MED ORDER — METHOCARBAMOL 500 MG PO TABS: 500.0000 mg | ORAL_TABLET | Freq: Four times a day (QID) | ORAL | Status: DC | PRN

## 2021-01-10 MED ORDER — ONDANSETRON HCL 4 MG/2ML IJ SOLN
4.0000 mg | Freq: Four times a day (QID) | INTRAMUSCULAR | Status: DC | PRN
  Administered 2021-01-10 – 2021-01-11 (×3): 4 mg via INTRAVENOUS
  Filled 2021-01-10 (×3): qty 2

## 2021-01-10 MED ORDER — ACETAMINOPHEN 325 MG PO TABS: 650.0000 mg | ORAL_TABLET | Freq: Four times a day (QID) | ORAL | Status: DC | PRN

## 2021-01-10 MED ORDER — SIMETHICONE 80 MG PO CHEW
40.0000 mg | CHEWABLE_TABLET | Freq: Four times a day (QID) | ORAL | Status: DC | PRN
  Filled 2021-01-10: qty 1

## 2021-01-10 MED ORDER — IOHEXOL 350 MG/ML SOLN
100.0000 mL | Freq: Once | INTRAVENOUS | Status: AC | PRN
  Administered 2021-01-10: 85 mL via INTRAVENOUS

## 2021-01-10 MED ORDER — PANTOPRAZOLE SODIUM 40 MG IV SOLR
40.0000 mg | Freq: Two times a day (BID) | INTRAVENOUS | Status: DC
  Administered 2021-01-10 – 2021-01-14 (×8): 40 mg via INTRAVENOUS
  Filled 2021-01-10 (×8): qty 40

## 2021-01-10 MED ORDER — ACETAMINOPHEN 650 MG RE SUPP: 650.0000 mg | Freq: Four times a day (QID) | RECTAL | Status: DC | PRN

## 2021-01-10 MED ORDER — ACETAMINOPHEN 10 MG/ML IV SOLN
1000.0000 mg | Freq: Four times a day (QID) | INTRAVENOUS | Status: AC
  Administered 2021-01-10 – 2021-01-11 (×4): 1000 mg via INTRAVENOUS
  Filled 2021-01-10 (×5): qty 100

## 2021-01-10 MED ORDER — HYDRALAZINE HCL 20 MG/ML IJ SOLN: 10.0000 mg | Freq: Three times a day (TID) | INTRAMUSCULAR | Status: DC | PRN

## 2021-01-10 MED ORDER — FENTANYL CITRATE PF 50 MCG/ML IJ SOSY
50.0000 ug | PREFILLED_SYRINGE | INTRAMUSCULAR | Status: DC | PRN
  Administered 2021-01-10 (×2): 50 ug via INTRAVENOUS
  Filled 2021-01-10 (×2): qty 1

## 2021-01-10 MED ORDER — DIPHENHYDRAMINE HCL 25 MG PO CAPS: 25.0000 mg | ORAL_CAPSULE | Freq: Four times a day (QID) | ORAL | Status: DC | PRN

## 2021-01-10 MED ORDER — PANTOPRAZOLE SODIUM 40 MG IV SOLR
40.0000 mg | Freq: Once | INTRAVENOUS | Status: AC
  Administered 2021-01-10: 40 mg via INTRAVENOUS
  Filled 2021-01-10: qty 40

## 2021-01-10 MED ORDER — DIPHENHYDRAMINE HCL 50 MG/ML IJ SOLN: 25.0000 mg | Freq: Four times a day (QID) | INTRAMUSCULAR | Status: DC | PRN

## 2021-01-10 MED ORDER — ONDANSETRON 4 MG PO TBDP: 4.0000 mg | ORAL_TABLET | Freq: Four times a day (QID) | ORAL | Status: DC | PRN

## 2021-01-10 NOTE — ED Notes (Signed)
[facility] at bedside for transport. 

## 2021-01-10 NOTE — ED Triage Notes (Signed)
Pt c/o abd pain, n/v/d started yesterday-NAD-steady gait

## 2021-01-10 NOTE — ED Provider Notes (Signed)
Emergency Department Provider Note   I have reviewed the triage vital signs and the nursing notes.   HISTORY  Chief Complaint Abdominal Pain   HPI Patricia DAUGHTRY is a 35 y.o. female past medical history reviewed below presents to the emergency department for evaluation of epigastric pain.  Pain began last night and is sharp/pressure in quality.  She denies radiation up into the chest but is having pain radiating down into the abdomen.  Symptoms are worse with standing but also with drinking fluids.  She is not been able to eat very much.  She is had some associated nonbloody vomiting and diarrhea.  No recent antibiotics.  Denies travel or sick contacts.  She is not having fevers.  No dysuria, hesitancy, urgency.   Past Medical History:  Diagnosis Date   GERD (gastroesophageal reflux disease)    no current med.   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  Social History Social History   Tobacco Use   Smoking status: Every Day    Packs/day: 1.00    Years: 16.00    Pack years: 16.00    Types: Cigarettes   Smokeless tobacco: Never  Vaping Use   Vaping Use: Never used  Substance Use Topics   Alcohol use: Yes    Comment: socially   Drug use: No    Review of  Systems  Constitutional: No fever/chills Eyes: No visual changes. ENT: No sore throat. Cardiovascular: Denies chest pain. Respiratory: Denies shortness of breath. Gastrointestinal: Positive lower abdominal pain. Positive nausea, vomiting, and diarrhea.  No constipation. Genitourinary: Negative for dysuria. Musculoskeletal: Negative for back pain. Skin: Negative for rash. Neurological: Negative for headaches, focal weakness or numbness.  10-point ROS otherwise negative.  ____________________________________________   PHYSICAL EXAM:  VITAL SIGNS: ED Triage Vitals  Enc Vitals Group     BP 01/10/21 1255 118/74     Pulse Rate 01/10/21 1255 (!) 114     Resp 01/10/21 1255 20     Temp 01/10/21 1255 97.8 F (36.6 C)     Temp Source 01/10/21 1255 Oral     SpO2 01/10/21 1255 100 %     Weight 01/10/21 1256 151 lb (68.5 kg)     Height 01/10/21 1256 5\' 9"  (1.753 m)   Constitutional: Alert and oriented. Well appearing and in no acute distress. Eyes: Conjunctivae are normal. Head: Atraumatic. Nose: No congestion/rhinnorhea. Mouth/Throat: Mucous membranes are moist.  Neck: No stridor.   Cardiovascular: Normal rate, regular rhythm. Good peripheral circulation. Grossly normal heart sounds.   Respiratory: Normal respiratory effort.  No retractions. Lungs CTAB. Gastrointestinal: Soft w/ diffuse tenderness. No rebound or guarding. No peritonitis. No distention.  Musculoskeletal: No lower extremity tenderness nor edema. No gross deformities of extremities. Neurologic:  Normal speech and  language. No gross focal neurologic deficits are appreciated.  Skin:  Skin is warm, dry and intact. No rash noted.  ____________________________________________   LABS (all labs ordered are listed, but only abnormal results are displayed)  Labs Reviewed  COMPREHENSIVE METABOLIC PANEL - Abnormal; Notable for the following components:      Result Value   Glucose, Bld 109 (*)    AST 12 (*)    Total  Bilirubin 1.5 (*)    All other components within normal limits  CBC WITH DIFFERENTIAL/PLATELET - Abnormal; Notable for the following components:   WBC 19.9 (*)    Neutro Abs 18.0 (*)    Monocytes Absolute 1.1 (*)    Abs Immature Granulocytes 0.08 (*)    All other components within normal limits  URINALYSIS, ROUTINE W REFLEX MICROSCOPIC - Abnormal; Notable for the following components:   Color, Urine AMBER (*)    APPearance HAZY (*)    Hgb urine dipstick LARGE (*)    Ketones, ur 5 (*)    Protein, ur 30 (*)    Leukocytes,Ua MODERATE (*)    All other components within normal limits  URINALYSIS, MICROSCOPIC (REFLEX) - Abnormal; Notable for the following components:   Bacteria, UA MANY (*)    All other components within normal limits  URINE CULTURE  RESP PANEL BY RT-PCR (FLU A&B, COVID) ARPGX2  LIPASE, BLOOD  PREGNANCY, URINE   ____________________________________________  RADIOLOGY  CT ABDOMEN PELVIS W CONTRAST  Result Date: 01/10/2021 CLINICAL DATA:  Lt sided abd pain that started yesterday, radiates into both hips, n/v/d. ^32mL OMNIPAQUE IOHEXOL 350 MG/ML SOLNAbdominal pain, acute, nonlocalized EXAM: CT ABDOMEN AND PELVIS WITH CONTRAST TECHNIQUE: Multidetector CT imaging of the abdomen and pelvis was performed using the standard protocol following bolus administration of intravenous contrast. CONTRAST:  44mL OMNIPAQUE IOHEXOL 350 MG/ML SOLN COMPARISON:  None. FINDINGS: Lower chest: Lung bases are clear. Hepatobiliary: No focal hepatic lesion. Several gallstones are present without evidence acute cholecystitis. Stones are relatively large at 8 mm each. Common bile duct normal caliber. Pancreas: Pancreas is normal. No ductal dilatation. No pancreatic inflammation. Spleen: Normal spleen Adrenals/urinary tract: Adrenal glands and kidneys are normal. The ureters and bladder normal. Stomach/Bowel: Post gastric sleeve bariatric surgery anatomy. Stomach appears normal. Duodenum is normal. In the  proximal small bowel at the level of the ligament of Treitz there is a enteric enteric intussusception measuring approximately 5 cm in length and well seen on coronal image 55/series 5. There is no bowel dilatation proximal to this option. Distally the small bowel appears normal. There is no mass lesion identified. On comparison CT from 12/19/2018 there is a short intussusception at this same level measuring approximately 2 cm (image 59/series 5, 01/15/2019). The jejunum is normal. Terminal ileum normal. Appendix normal. Ascending transverse colon normal. Descending colon rectosigmoid colon. Vascular/Lymphatic: Abdominal aorta is normal caliber. No periportal or retroperitoneal adenopathy. No pelvic adenopathy. Reproductive: IUD in expected location in the uterus. Adnexa normal. Other: No free fluid. Musculoskeletal: No aggressive osseous lesion. IMPRESSION: 1. Relatively Glenna Brunkow enteric enteric intussusception in the proximal small bowel measuring approximately 5-6 cm in length. While this may represent a transient finding, in patient with LEFT upper quadrant pain recommend surgical consultation. No lead point identified. 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 2. Post bariatric surgery with gastric sleeve anatomy noted. 3. No evidence of bowel obstruction. Electronically Signed   By: Genevive Bi M.D.   On: 01/10/2021 15:23    ____________________________________________  PROCEDURES  Procedure(s) performed:   Procedures  None  ____________________________________________   INITIAL IMPRESSION / ASSESSMENT AND PLAN / ED COURSE  Pertinent labs & imaging results that were available during my care of the patient were reviewed by me and considered in my medical decision making (see chart for details).   Patient presents to the emergency department for evaluation of abdominal pain.  No peritonitis but patient is diffusely tender.  Vital  signs with tachycardia but no hypotension or fever.  No radiation of symptoms up into the chest to strongly suspect ACS or PE.   Differential diagnosis includes but is not exclusive to acute cholecystitis, intrathoracic causes for epigastric abdominal pain, gastritis, duodenitis, pancreatitis, small bowel or large bowel obstruction, abdominal aortic aneurysm, hernia, gastritis, etc.   CT imaging reviewed. Patient with intussusception in the area of maximum pain for her on exam. Discussed case with general surgery, Dr. Gerrit Friends and place temporary admit orders at his request. Plan for pain control, IVF, and admit.   Discussed patient's case with General Surgery to request admission. Patient and family (if present) updated with plan. Care transferred to Surgery service.  I reviewed all nursing notes, vitals, pertinent old records, EKGs, labs, imaging (as available).  ____________________________________________  FINAL CLINICAL IMPRESSION(S) / ED DIAGNOSES  Final diagnoses:  Intussusception intestine (HCC)     MEDICATIONS GIVEN DURING THIS VISIT:  Medications  fentaNYL (SUBLIMAZE) injection 50 mcg (50 mcg Intravenous Given 01/10/21 1441)  cefTRIAXone (ROCEPHIN) 1 g in sodium chloride 0.9 % 100 mL IVPB (has no administration in time range)  lactated ringers infusion (has no administration in time range)  sodium chloride 0.9 % bolus 1,000 mL (1,000 mLs Intravenous New Bag/Given 01/10/21 1342)  ondansetron (ZOFRAN) injection 4 mg (4 mg Intravenous Given 01/10/21 1342)  pantoprazole (PROTONIX) injection 40 mg (40 mg Intravenous Given 01/10/21 1342)  dicyclomine (BENTYL) injection 20 mg (20 mg Intramuscular Given 01/10/21 1345)  iohexol (OMNIPAQUE) 350 MG/ML injection 100 mL (85 mLs Intravenous Contrast Given 01/10/21 1422)    Note:  This document was prepared using Dragon voice recognition software and may include unintentional dictation errors.  Alona Bene, MD,  [facility] Emergency Medicine     Jerrion Tabbert, Arlyss Repress, MD 01/10/21 [phone]

## 2021-01-10 NOTE — H&P (Signed)
Patricia Mcmahon is an 35 y.o. female.   Chief Complaint: ab pain HPI: 60 yof s/p lap sleeve gastrectomy about 10 years ago in [place] who presents with acute onset of abdominal pain last night around five. This has persisted.  It is somewhat better now and she is lying comfortably.  She reports multiple episodes of bilious emesis last this am.  She also reports numerous loose stools as well, no blood in either noted.  She has no urinary symptoms.  She has no fevers.  She had wbc of 19.9, ct that shows what appears to be an intussusception near LoT.  This was present to some degree on past scan. She does also have gallstones. She states pain was epigastric but now is in both lq as well   Past Medical History:  Diagnosis Date   GERD (gastroesophageal reflux disease)    no current med.   Left knee pain 12/2012    Past Surgical History:  Procedure Laterality Date   CESAREAN SECTION     GASTRECTOMY     sleeve   KNEE ARTHROSCOPY  04/02/2012   Procedure: ARTHROSCOPY KNEE;  Surgeon: Velna Ochs, MD;  Location: [facility];  Service: Orthopedics;  Laterality: Left;  left knee arthroscopy chondroplasty   KNEE ARTHROSCOPY WITH LATERAL RELEASE Left 01/07/2013   Procedure: LEFT KNEE ARTHROSCOPY, CHONDROPLASTY AND  LATERAL RELEASE;  Surgeon: Velna Ochs, MD;  Location: [facility];  Service: Orthopedics;  Laterality: Left;    No family history on file. Social History:  reports that she has been smoking cigarettes. She has a 16.00 pack-year smoking history. She has never used smokeless tobacco. She reports current alcohol use. She reports that she does not use drugs.  Allergies:  Allergies  Allergen Reactions   Morphine And Related Other (See Comments)    Behavior changes - becomes hostile/violent    Medications Prior to Admission  Medication Sig Dispense Refill   acetaminophen (TYLENOL) 325 MG tablet Take 650 mg by mouth every 6 (six) hours as needed  (pain).     ibuprofen (ADVIL) 200 MG tablet Take 800 mg by mouth 3 (three) times daily as needed (pain).     levonorgestrel (MIRENA) 20 MCG/24HR IUD 1 each by Intrauterine route once. Implanted 1st part of the year 2020      Results for orders placed or performed during the hospital encounter of 01/10/21 (from the past 48 hour(s))  Urinalysis, Routine w reflex microscopic Urine, Clean Catch     Status: Abnormal   Collection Time: 01/10/21  1:20 PM  Result Value Ref Range   Color, Urine AMBER (A) YELLOW    Comment: BIOCHEMICALS MAY BE AFFECTED BY COLOR   APPearance HAZY (A) CLEAR   Specific Gravity, Urine 1.030 1.005 - 1.030   pH 6.0 5.0 - 8.0   Glucose, UA NEGATIVE NEGATIVE mg/dL   Hgb urine dipstick LARGE (A) NEGATIVE   Bilirubin Urine NEGATIVE NEGATIVE   Ketones, ur 5 (A) NEGATIVE mg/dL   Protein, ur 30 (A) NEGATIVE mg/dL   Nitrite NEGATIVE NEGATIVE   Leukocytes,Ua MODERATE (A) NEGATIVE    Comment: Performed at [facility], [address] [place], [place] [address]  Pregnancy, urine     Status: None   Collection Time: 01/10/21  1:20 PM  Result Value Ref Range   Preg Test, Ur NEGATIVE NEGATIVE    Comment:        THE SENSITIVITY OF THIS METHODOLOGY IS >  20 mIU/mL. Performed at [facility], [address] [place], [place] [address]   Urinalysis, Microscopic (reflex)     Status: Abnormal   Collection Time: 01/10/21  1:20 PM  Result Value Ref Range   RBC / HPF 21-50 0 - 5 RBC/hpf   WBC, UA 21-50 0 - 5 WBC/hpf   Bacteria, UA MANY (A) NONE SEEN   Squamous Epithelial / LPF 21-50 0 - 5   Mucus PRESENT     Comment: Performed at [facility], [address] [place], [place] [address]  Comprehensive metabolic panel     Status: Abnormal   Collection Time: 01/10/21  1:35 PM  Result Value Ref Range   Sodium 135 135 - 145 mmol/L   Potassium 3.5 3.5 - 5.1 mmol/L   Chloride 101 98 - 111 mmol/L   CO2 25 22 - 32 mmol/L   Glucose, Bld 109 (H) 70 - 99  mg/dL    Comment: Glucose reference range applies only to samples taken after fasting for at least 8 hours.   BUN 10 6 - 20 mg/dL   Creatinine, Ser 8.67 0.44 - 1.00 mg/dL   Calcium 9.7 8.9 - 61.9 mg/dL   Total Protein 7.0 6.5 - 8.1 g/dL   Albumin 3.8 3.5 - 5.0 g/dL   AST 12 (L) 15 - 41 U/L   ALT 11 0 - 44 U/L   Alkaline Phosphatase 55 38 - 126 U/L   Total Bilirubin 1.5 (H) 0.3 - 1.2 mg/dL   GFR, Estimated >50 >93 mL/min    Comment: (NOTE) Calculated using the CKD-EPI Creatinine Equation (2021)    Anion gap 9 5 - 15    Comment: Performed at [facility], [address] [place], [place] [address]  Lipase, blood     Status: None   Collection Time: 01/10/21  1:35 PM  Result Value Ref Range   Lipase 23 11 - 51 U/L    Comment: Performed at [facility], [address] [place], [place] [address]  CBC with Differential     Status: Abnormal   Collection Time: 01/10/21  1:35 PM  Result Value Ref Range   WBC 19.9 (H) 4.0 - 10.5 K/uL   RBC 4.38 3.87 - 5.11 MIL/uL   Hemoglobin 14.3 12.0 - 15.0 g/dL   HCT 98.3 38.2 - 50.5 %   MCV 95.2 80.0 - 100.0 fL   MCH 32.6 26.0 - 34.0 pg   MCHC 34.3 30.0 - 36.0 g/dL   RDW 39.7 67.3 - 41.9 %   Platelets 157 150 - 400 K/uL   nRBC 0.0 0.0 - 0.2 %   Neutrophils Relative % 91 %   Neutro Abs 18.0 (H) 1.7 - 7.7 K/uL   Lymphocytes Relative 4 %   Lymphs Abs 0.7 0.7 - 4.0 K/uL   Monocytes Relative 5 %   Monocytes Absolute 1.1 (H) 0.1 - 1.0 K/uL   Eosinophils Relative 0 %   Eosinophils Absolute 0.0 0.0 - 0.5 K/uL   Basophils Relative 0 %   Basophils Absolute 0.1 0.0 - 0.1 K/uL   Immature Granulocytes 0 %   Abs Immature Granulocytes 0.08 (H) 0.00 - 0.07 K/uL    Comment: Performed at [facility], [address] [place], [place] [address]  Resp Panel by RT-PCR (Flu A&B, Covid) Nasopharyngeal Swab     Status: None   Collection Time: 01/10/21  3:51 PM  Specimen: Nasopharyngeal Swab; Nasopharyngeal(NP) swabs in vial  transport medium  Result Value Ref Range   SARS Coronavirus 2 by RT PCR NEGATIVE NEGATIVE    Comment: (NOTE) SARS-CoV-2 target nucleic acids are NOT DETECTED.  The SARS-CoV-2 RNA is generally detectable in upper respiratory specimens during the acute phase of infection. The lowest concentration of SARS-CoV-2 viral copies this assay can detect is 138 copies/mL. A negative result does not preclude SARS-Cov-2 infection and should not be used as the sole basis for treatment or other patient management decisions. A negative result may occur with  improper specimen collection/handling, submission of specimen other than nasopharyngeal swab, presence of viral mutation(s) within the areas targeted by this assay, and inadequate number of viral copies(<138 copies/mL). A negative result must be combined with clinical observations, patient history, and epidemiological information. The expected result is Negative.  Fact Sheet for Patients:  BloggerCourse.com  Fact Sheet for Healthcare Providers:  SeriousBroker.it  This test is no t yet approved or cleared by the [place] FDA and  has been authorized for detection and/or diagnosis of SARS-CoV-2 by FDA under an Emergency Use Authorization (EUA). This EUA will remain  in effect (meaning this test can be used) for the duration of the COVID-19 declaration under Section 564(b)(1) of the Act, 21 U.S.C.section 360bbb-3(b)(1), unless the authorization is terminated  or revoked sooner.       Influenza A by PCR NEGATIVE NEGATIVE   Influenza B by PCR NEGATIVE NEGATIVE    Comment: (NOTE) The Xpert Xpress SARS-CoV-2/FLU/RSV plus assay is intended as an aid in the diagnosis of influenza from Nasopharyngeal swab specimens and should not be used as a sole basis for treatment. Nasal washings and aspirates are unacceptable for Xpert Xpress SARS-CoV-2/FLU/RSV testing.  Fact Sheet for  Patients: BloggerCourse.com  Fact Sheet for Healthcare Providers: SeriousBroker.it  This test is not yet approved or cleared by the [place] FDA and has been authorized for detection and/or diagnosis of SARS-CoV-2 by FDA under an Emergency Use Authorization (EUA). This EUA will remain in effect (meaning this test can be used) for the duration of the COVID-19 declaration under Section 564(b)(1) of the Act, 21 U.S.C. section 360bbb-3(b)(1), unless the authorization is terminated or revoked.  Performed at [facility], [address] [place], [place] [address]    CT ABDOMEN PELVIS W CONTRAST  Result Date: 01/10/2021 CLINICAL DATA:  Lt sided abd pain that started yesterday, radiates into both hips, n/v/d. ^14mL OMNIPAQUE IOHEXOL 350 MG/ML SOLNAbdominal pain, acute, nonlocalized EXAM: CT ABDOMEN AND PELVIS WITH CONTRAST TECHNIQUE: Multidetector CT imaging of the abdomen and pelvis was performed using the standard protocol following bolus administration of intravenous contrast. CONTRAST:  52mL OMNIPAQUE IOHEXOL 350 MG/ML SOLN COMPARISON:  None. FINDINGS: Lower chest: Lung bases are clear. Hepatobiliary: No focal hepatic lesion. Several gallstones are present without evidence acute cholecystitis. Stones are relatively large at 8 mm each. Common bile duct normal caliber. Pancreas: Pancreas is normal. No ductal dilatation. No pancreatic inflammation. Spleen: Normal spleen Adrenals/urinary tract: Adrenal glands and kidneys are normal. The ureters and bladder normal. Stomach/Bowel: Post gastric sleeve bariatric surgery anatomy. Stomach appears normal. Duodenum is normal. In the proximal small bowel at the level of the ligament of Treitz there is a enteric enteric intussusception measuring approximately 5 cm in length and well seen on coronal image 55/series 5. There is no bowel dilatation proximal to this option. Distally the small bowel  appears normal. There is no mass lesion identified. On  comparison CT from 12/19/2018 there is a short intussusception at this same level measuring approximately 2 cm (image 59/series 5, 01/15/2019). The jejunum is normal. Terminal ileum normal. Appendix normal. Ascending transverse colon normal. Descending colon rectosigmoid colon. Vascular/Lymphatic: Abdominal aorta is normal caliber. No periportal or retroperitoneal adenopathy. No pelvic adenopathy. Reproductive: IUD in expected location in the uterus. Adnexa normal. Other: No free fluid. Musculoskeletal: No aggressive osseous lesion. IMPRESSION: 1. Relatively long enteric enteric intussusception in the proximal small bowel measuring approximately 5-6 cm in length. While this may represent a transient finding, in patient with LEFT upper quadrant pain recommend surgical consultation. No lead point identified. 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 2. Post bariatric surgery with gastric sleeve anatomy noted. 3. No evidence of bowel obstruction. Electronically Signed   By: Genevive Bi M.D.   On: 01/10/2021 15:23    Review of Systems  Respiratory:  Negative for shortness of breath.   Cardiovascular:  Negative for chest pain.  Gastrointestinal:  Positive for abdominal pain, diarrhea, nausea and vomiting. Negative for blood in stool.  All other systems reviewed and are negative.  Blood pressure (!) 113/93, pulse 85, temperature 98.7 F (37.1 C), temperature source Oral, resp. rate 18, height 5\' 9"  (1.753 m), weight 68.5 kg, SpO2 100 %. Physical Exam Constitutional:      Appearance: She is well-developed.  HENT:     Head: Normocephalic and atraumatic.  Eyes:     General: No scleral icterus. Cardiovascular:     Rate and Rhythm: Normal rate and regular rhythm.     Heart sounds: Normal heart sounds.  Pulmonary:     Effort: Pulmonary effort is normal.     Breath sounds: Normal breath  sounds.  Abdominal:     General: A surgical scar is present. Bowel sounds are normal. There is no distension.     Palpations: Abdomen is soft.     Tenderness: There is abdominal tenderness in the epigastric area and periumbilical area.     Hernia: No hernia is present.  Skin:    General: Skin is warm and dry.     Capillary Refill: Capillary refill takes less than 2 seconds.  Neurological:     General: No focal deficit present.     Mental Status: She is alert.  Psychiatric:        Mood and Affect: Mood normal. Mood is not anxious.     Assessment/Plan Ab pain, ct with intussusception -I am not sure of source of pain, she is now afebrile complains of pain but lying fairly comfortably with normal heart rate -check lactate -I think she will likely end up needing endoscopy and possible laparoscope for this but I suppose with n/v/d it could be enteritis and if much improved in am could repeat the ct scan. However it was abnormal before and would at least need to be further evaluated with endoscopy for a lead point. -I  don't think she needs urgent surgery tonight and discussed plan with her -lovenox, scds  Marland Kitchen, MD 01/10/2021, 7:19 PM

## 2021-01-10 NOTE — ED Notes (Signed)
Phone report to casey rn at [facility], all questions answered, report acknowledged

## 2021-01-11 ENCOUNTER — Observation Stay (HOSPITAL_COMMUNITY): Payer: BC Managed Care – PPO

## 2021-01-11 DIAGNOSIS — I700 Atherosclerosis of aorta: Secondary | ICD-10-CM | POA: Diagnosis not present

## 2021-01-11 DIAGNOSIS — K561 Intussusception: Secondary | ICD-10-CM | POA: Diagnosis not present

## 2021-01-11 DIAGNOSIS — K6389 Other specified diseases of intestine: Secondary | ICD-10-CM | POA: Diagnosis not present

## 2021-01-11 DIAGNOSIS — R1084 Generalized abdominal pain: Secondary | ICD-10-CM | POA: Diagnosis not present

## 2021-01-11 LAB — URINE CULTURE: Culture: NO GROWTH

## 2021-01-11 LAB — BASIC METABOLIC PANEL
Anion gap: 7 (ref 5–15)
BUN: 8 mg/dL (ref 6–20)
CO2: 24 mmol/L (ref 22–32)
Calcium: 9.1 mg/dL (ref 8.9–10.3)
Chloride: 106 mmol/L (ref 98–111)
Creatinine, Ser: 0.73 mg/dL (ref 0.44–1.00)
GFR, Estimated: >60 mL/min (ref >60)
Glucose, Bld: 84 mg/dL (ref 70–99)
Potassium: 3.5 mmol/L (ref 3.5–5.1)
Sodium: 137 mmol/L (ref 135–145)

## 2021-01-11 LAB — CBC
HCT: 35.5 % — ABNORMAL LOW (ref 36.0–46.0)
Hemoglobin: 11.9 g/dL — ABNORMAL LOW (ref 12.0–15.0)
MCH: 32.9 pg (ref 26.0–34.0)
MCHC: 33.5 g/dL (ref 30.0–36.0)
MCV: 98.1 fL (ref 80.0–100.0)
Platelets: 121 10*3/uL — ABNORMAL LOW (ref 150–400)
RBC: 3.62 MIL/uL — ABNORMAL LOW (ref 3.87–5.11)
RDW: 12.8 % (ref 11.5–15.5)
WBC: 12.1 10*3/uL — ABNORMAL HIGH (ref 4.0–10.5)
nRBC: 0 % (ref 0.0–0.2)

## 2021-01-11 MED ORDER — IOHEXOL 9 MG/ML PO SOLN
ORAL | Status: AC
  Filled 2021-01-11: qty 1000

## 2021-01-11 MED ORDER — IOHEXOL 9 MG/ML PO SOLN
500.0000 mL | ORAL | Status: AC
  Administered 2021-01-11: 500 mL via ORAL

## 2021-01-11 MED ORDER — PROCHLORPERAZINE EDISYLATE 10 MG/2ML IJ SOLN
10.0000 mg | Freq: Four times a day (QID) | INTRAMUSCULAR | Status: DC | PRN
  Administered 2021-01-11 – 2021-01-14 (×3): 10 mg via INTRAVENOUS
  Filled 2021-01-11 (×3): qty 2

## 2021-01-11 NOTE — Progress Notes (Signed)
Patient ID: Patricia Mcmahon, female   DOB: 1985/07/31, 35 y.o.   MRN: [id number]       Patient seen and examined.  History reviewed.  Studies reviewed.  Awaiting repeat CT scan this afternoon.  Patient has continued LUQ pain, nausea, and emesis.  No further diarrhea.  WBC improved.  Abd is soft without distension.  Tender LUQ.  No mass  History of gastric sleeve resection and C-section.  Discussed intussusception and surgical management.  If persistent findings on CT and symptoms, will likely proceed with laparotomy and resection tomorrow.  If resolution on CT, may need GI consult and endoscopy prior to any surgery.  Patient understands and agrees with plans for further evaluation.  Await CT results.  Darnell Level, MD [facility] Surgery A [facility] practice Office: [phone]

## 2021-01-11 NOTE — Progress Notes (Addendum)
Subjective: Still with LUQ abdominal pain and some nausea.  No further diarrhea since admission.    ROS: See above, otherwise other systems negative  Objective: Vital signs in last 24 hours: Temp:  [97.7 F (36.5 C)-98.7 F (37.1 C)] 97.8 F (36.6 C) (09/27 0937) Pulse Rate:  [55-114] 74 (09/27 0937) Resp:  [16-20] 18 (09/27 0937) BP: (93-118)/(44-93) 102/66 (09/27 0937) SpO2:  [99 %-100 %] 100 % (09/27 0937) Weight:  [68.5 kg] 68.5 kg (09/26 1256) Last BM Date: 01/10/21  Intake/Output from previous day: 09/26 0701 - 09/27 0700 In: 1100.3 [IV Piggyback:1100.3] Out: 0  Intake/Output this shift: No intake/output data recorded.  PE: Heart: regular Lungs: CTAB Abd: soft, but still tender in LUQ, no peritonitis or guarding, few BS, ND  Lab Results:  Recent Labs    01/10/21 1335 01/11/21 0454  WBC 19.9* 12.1*  HGB 14.3 11.9*  HCT 41.7 35.5*  PLT 157 121*   BMET Recent Labs    01/10/21 1335 01/11/21 0454  NA 135 137  K 3.5 3.5  CL 101 106  CO2 25 24  GLUCOSE 109* 84  BUN 10 8  CREATININE 0.76 0.73  CALCIUM 9.7 9.1   PT/INR No results for input(s): LABPROT, INR in the last 72 hours. CMP     Component Value Date/Time   NA 137 01/11/2021 0454   K 3.5 01/11/2021 0454   CL 106 01/11/2021 0454   CO2 24 01/11/2021 0454   GLUCOSE 84 01/11/2021 0454   BUN 8 01/11/2021 0454   CREATININE 0.73 01/11/2021 0454   CALCIUM 9.1 01/11/2021 0454   PROT 7.0 01/10/2021 1335   ALBUMIN 3.8 01/10/2021 1335   AST 12 (L) 01/10/2021 1335   ALT 11 01/10/2021 1335   ALKPHOS 55 01/10/2021 1335   BILITOT 1.5 (H) 01/10/2021 1335   GFRNONAA >60 01/11/2021 0454   GFRAA >60 01/16/2019 1608   Lipase     Component Value Date/Time   LIPASE 23 01/10/2021 1335       Studies/Results: CT ABDOMEN PELVIS W CONTRAST  Result Date: 01/10/2021 CLINICAL DATA:  Lt sided abd pain that started yesterday, radiates into both hips, n/v/d. ^37mL OMNIPAQUE IOHEXOL 350 MG/ML  SOLNAbdominal pain, acute, nonlocalized EXAM: CT ABDOMEN AND PELVIS WITH CONTRAST TECHNIQUE: Multidetector CT imaging of the abdomen and pelvis was performed using the standard protocol following bolus administration of intravenous contrast. CONTRAST:  47mL OMNIPAQUE IOHEXOL 350 MG/ML SOLN COMPARISON:  None. FINDINGS: Lower chest: Lung bases are clear. Hepatobiliary: No focal hepatic lesion. Several gallstones are present without evidence acute cholecystitis. Stones are relatively large at 8 mm each. Common bile duct normal caliber. Pancreas: Pancreas is normal. No ductal dilatation. No pancreatic inflammation. Spleen: Normal spleen Adrenals/urinary tract: Adrenal glands and kidneys are normal. The ureters and bladder normal. Stomach/Bowel: Post gastric sleeve bariatric surgery anatomy. Stomach appears normal. Duodenum is normal. In the proximal small bowel at the level of the ligament of Treitz there is a enteric enteric intussusception measuring approximately 5 cm in length and well seen on coronal image 55/series 5. There is no bowel dilatation proximal to this option. Distally the small bowel appears normal. There is no mass lesion identified. On comparison CT from 12/19/2018 there is a short intussusception at this same level measuring approximately 2 cm (image 59/series 5, 01/15/2019). The jejunum is normal. Terminal ileum normal. Appendix normal. Ascending transverse colon normal. Descending colon rectosigmoid colon. Vascular/Lymphatic: Abdominal aorta is normal caliber. No periportal or retroperitoneal  adenopathy. No pelvic adenopathy. Reproductive: IUD in expected location in the uterus. Adnexa normal. Other: No free fluid. Musculoskeletal: No aggressive osseous lesion. IMPRESSION: 1. Relatively long enteric enteric intussusception in the proximal small bowel measuring approximately 5-6 cm in length. While this may represent a transient finding, in patient with LEFT upper quadrant pain recommend surgical  consultation. No lead point identified. 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 2. Post bariatric surgery with gastric sleeve anatomy noted. 3. No evidence of bowel obstruction. Electronically Signed   By: Genevive Bi M.D.   On: 01/10/2021 15:23    Anti-infectives: Anti-infectives (From admission, onward)    Start     Dose/Rate Route Frequency Ordered Stop   01/10/21 1530  cefTRIAXone (ROCEPHIN) 1 g in sodium chloride 0.9 % 100 mL IVPB        1 g 200 mL/hr over 30 Minutes Intravenous  Once 01/10/21 1528 01/10/21 1630        Assessment/Plan Abdominal pain with intussusception -repeat CT scan today to see if this is still present.  If so, will likely require dx lap to further evaluate this given this is not her first occurrence.   -if this is resolved, then may need endoscopy to evaluate for a lead point that has been the etiology of this finding -cont NPO for now and await CT results  FEN - NPO/IVFs VTE - Lovenox ID - Rocephin x 1 dose for UTI  UTI   LOS: 0 days    Letha Cape , [facility] 01/11/2021, 10:06 AM Please see Amion for pager number during day hours 7:00am-4:30pm or 7:00am -11:30am on weekends

## 2021-01-12 DIAGNOSIS — E162 Hypoglycemia, unspecified: Secondary | ICD-10-CM | POA: Diagnosis not present

## 2021-01-12 DIAGNOSIS — Z20822 Contact with and (suspected) exposure to covid-19: Secondary | ICD-10-CM | POA: Diagnosis present

## 2021-01-12 DIAGNOSIS — Z975 Presence of (intrauterine) contraceptive device: Secondary | ICD-10-CM | POA: Diagnosis not present

## 2021-01-12 DIAGNOSIS — K561 Intussusception: Secondary | ICD-10-CM | POA: Diagnosis not present

## 2021-01-12 DIAGNOSIS — R933 Abnormal findings on diagnostic imaging of other parts of digestive tract: Secondary | ICD-10-CM | POA: Diagnosis not present

## 2021-01-12 DIAGNOSIS — K8020 Calculus of gallbladder without cholecystitis without obstruction: Secondary | ICD-10-CM | POA: Diagnosis present

## 2021-01-12 DIAGNOSIS — Z9884 Bariatric surgery status: Secondary | ICD-10-CM | POA: Diagnosis not present

## 2021-01-12 DIAGNOSIS — R1084 Generalized abdominal pain: Secondary | ICD-10-CM | POA: Diagnosis not present

## 2021-01-12 DIAGNOSIS — R109 Unspecified abdominal pain: Secondary | ICD-10-CM | POA: Diagnosis not present

## 2021-01-12 DIAGNOSIS — F17210 Nicotine dependence, cigarettes, uncomplicated: Secondary | ICD-10-CM | POA: Diagnosis present

## 2021-01-12 DIAGNOSIS — D649 Anemia, unspecified: Secondary | ICD-10-CM | POA: Diagnosis present

## 2021-01-12 DIAGNOSIS — Z885 Allergy status to narcotic agent status: Secondary | ICD-10-CM | POA: Diagnosis not present

## 2021-01-12 DIAGNOSIS — N390 Urinary tract infection, site not specified: Secondary | ICD-10-CM | POA: Diagnosis present

## 2021-01-12 DIAGNOSIS — K219 Gastro-esophageal reflux disease without esophagitis: Secondary | ICD-10-CM | POA: Diagnosis not present

## 2021-01-12 LAB — BASIC METABOLIC PANEL
Anion gap: 8 (ref 5–15)
BUN: 9 mg/dL (ref 6–20)
CO2: 17 mmol/L — ABNORMAL LOW (ref 22–32)
Calcium: 8.6 mg/dL — ABNORMAL LOW (ref 8.9–10.3)
Chloride: 113 mmol/L — ABNORMAL HIGH (ref 98–111)
Creatinine, Ser: 0.76 mg/dL (ref 0.44–1.00)
GFR, Estimated: >60 mL/min (ref >60)
Glucose, Bld: 56 mg/dL — ABNORMAL LOW (ref 70–99)
Potassium: 3.6 mmol/L (ref 3.5–5.1)
Sodium: 138 mmol/L (ref 135–145)

## 2021-01-12 LAB — CBC
HCT: 34.6 % — ABNORMAL LOW (ref 36.0–46.0)
Hemoglobin: 11.3 g/dL — ABNORMAL LOW (ref 12.0–15.0)
MCH: 32.5 pg (ref 26.0–34.0)
MCHC: 32.7 g/dL (ref 30.0–36.0)
MCV: 99.4 fL (ref 80.0–100.0)
Platelets: 120 10*3/uL — ABNORMAL LOW (ref 150–400)
RBC: 3.48 MIL/uL — ABNORMAL LOW (ref 3.87–5.11)
RDW: 12.4 % (ref 11.5–15.5)
WBC: 6.5 10*3/uL (ref 4.0–10.5)
nRBC: 0 % (ref 0.0–0.2)

## 2021-01-12 LAB — GLUCOSE, CAPILLARY
Glucose-Capillary: 101 mg/dL — ABNORMAL HIGH (ref 70–99)
Glucose-Capillary: 40 mg/dL — CL (ref 70–99)

## 2021-01-12 MED ORDER — OXYCODONE HCL 5 MG PO TABS
5.0000 mg | ORAL_TABLET | ORAL | Status: DC | PRN
  Administered 2021-01-12: 5 mg via ORAL
  Filled 2021-01-12: qty 1

## 2021-01-12 MED ORDER — ACETAMINOPHEN 500 MG PO TABS: 1000.0000 mg | ORAL_TABLET | Freq: Four times a day (QID) | ORAL | Status: DC | PRN

## 2021-01-12 MED ORDER — DEXTROSE 50 % IV SOLN
12.5000 g | INTRAVENOUS | Status: AC
  Administered 2021-01-12: 12.5 g via INTRAVENOUS
  Filled 2021-01-12: qty 50

## 2021-01-12 NOTE — Progress Notes (Signed)
Subjective: LUQ pain is greatly improved though still present and nausea has resolved. Ambulating.  ROS: See above, otherwise other systems negative  Objective: Vital signs in last 24 hours: Temp:  [97.8 F (36.6 C)-98.7 F (37.1 C)] 98 F (36.7 C) (09/28 0629) Pulse Rate:  [68-77] 71 (09/28 0629) Resp:  [18] 18 (09/28 0629) BP: (102-104)/(60-66) 102/60 (09/28 0629) SpO2:  [99 %-100 %] 100 % (09/28 0629) Last BM Date: 01/10/21  Intake/Output from previous day: 09/27 0701 - 09/28 0700 In: 4723.2 [I.V.:4322.9; IV Piggyback:400.2] Out: 1275 [Urine:1275] Intake/Output this shift: No intake/output data recorded.  PE: Heart: regular rate and rhythm  Lungs: CTAB Abd: soft, mild TTP LUQ, no peritonitis or guarding, normal BS, ND MSK: calves soft. No lower extremity edema bilaterally Skin: warm and dry Psych: A&Ox3, normal affect  Lab Results:  Recent Labs    01/11/21 0454 01/12/21 0447  WBC 12.1* 6.5  HGB 11.9* 11.3*  HCT 35.5* 34.6*  PLT 121* 120*    BMET Recent Labs    01/11/21 0454 01/12/21 0447  NA 137 138  K 3.5 3.6  CL 106 113*  CO2 24 17*  GLUCOSE 84 56*  BUN 8 9  CREATININE 0.73 0.76  CALCIUM 9.1 8.6*    PT/INR No results for input(s): LABPROT, INR in the last 72 hours. CMP     Component Value Date/Time   NA 138 01/12/2021 0447   K 3.6 01/12/2021 0447   CL 113 (H) 01/12/2021 0447   CO2 17 (L) 01/12/2021 0447   GLUCOSE 56 (L) 01/12/2021 0447   BUN 9 01/12/2021 0447   CREATININE 0.76 01/12/2021 0447   CALCIUM 8.6 (L) 01/12/2021 0447   PROT 7.0 01/10/2021 1335   ALBUMIN 3.8 01/10/2021 1335   AST 12 (L) 01/10/2021 1335   ALT 11 01/10/2021 1335   ALKPHOS 55 01/10/2021 1335   BILITOT 1.5 (H) 01/10/2021 1335   GFRNONAA >60 01/12/2021 0447   GFRAA >60 01/16/2019 1608   Lipase     Component Value Date/Time   LIPASE 23 01/10/2021 1335       Studies/Results: CT ABDOMEN PELVIS WO CONTRAST  Result Date: 01/11/2021 CLINICAL DATA:   enteroenteric intussusception. EXAM: CT ABDOMEN AND PELVIS WITHOUT CONTRAST TECHNIQUE: Multidetector CT imaging of the abdomen and pelvis was performed following the standard protocol without IV contrast. COMPARISON:  CT abdomen and pelvis dated January 10 2021 FINDINGS: Lower chest: No acute abnormality. Hepatobiliary: No focal liver abnormality. Cholelithiasis. Vicarious excretion of contrast into the gallbladder. No biliary ductal dilation. Pancreas: Unremarkable. Spleen: Unremarkable. Adrenals/Urinary Tract: Adrenal glands are unremarkable. Kidneys are normal, without renal calculi, focal lesion, or hydronephrosis. Bladder is unremarkable. Stomach/Bowel: Prior sleeve gastrectomy. Contrast material is seen in the stomach to the level of the splenic flexure. Interval resolution of entero enteric intussusception of the proximal small bowel. Mild wall thickening of the proximal small bowel. Appendix is unremarkable. No bowel wall thickening, obstruction or adjacent inflammatory change. Vascular/Lymphatic: Involve atherosclerotic disease of the abdominal aorta. No pathologically enlarged lymph nodes in abdomen or pelvis. Reproductive: IUD in expected location in the uterus. Bilateral adnexa are unremarkable. Other: No abdominal wall hernia or abnormality. Trace free fluid in the pelvis. Musculoskeletal: No acute or significant osseous findings. IMPRESSION: Interval resolution of entero enteric intussusception of the proximal small bowel. Mild wall thickening of the proximal small bowel, findings can be seen in the setting of enteritis. Trace free fluid in the pelvis. Electronically Signed   By:  Allegra Lai M.D.   On: 01/11/2021 16:52   CT ABDOMEN PELVIS W CONTRAST  Result Date: 01/10/2021 CLINICAL DATA:  Lt sided abd pain that started yesterday, radiates into both hips, n/v/d. ^50mL OMNIPAQUE IOHEXOL 350 MG/ML SOLNAbdominal pain, acute, nonlocalized EXAM: CT ABDOMEN AND PELVIS WITH CONTRAST TECHNIQUE:  Multidetector CT imaging of the abdomen and pelvis was performed using the standard protocol following bolus administration of intravenous contrast. CONTRAST:  49mL OMNIPAQUE IOHEXOL 350 MG/ML SOLN COMPARISON:  None. FINDINGS: Lower chest: Lung bases are clear. Hepatobiliary: No focal hepatic lesion. Several gallstones are present without evidence acute cholecystitis. Stones are relatively large at 8 mm each. Common bile duct normal caliber. Pancreas: Pancreas is normal. No ductal dilatation. No pancreatic inflammation. Spleen: Normal spleen Adrenals/urinary tract: Adrenal glands and kidneys are normal. The ureters and bladder normal. Stomach/Bowel: Post gastric sleeve bariatric surgery anatomy. Stomach appears normal. Duodenum is normal. In the proximal small bowel at the level of the ligament of Treitz there is a enteric enteric intussusception measuring approximately 5 cm in length and well seen on coronal image 55/series 5. There is no bowel dilatation proximal to this option. Distally the small bowel appears normal. There is no mass lesion identified. On comparison CT from 12/19/2018 there is a short intussusception at this same level measuring approximately 2 cm (image 59/series 5, 01/15/2019). The jejunum is normal. Terminal ileum normal. Appendix normal. Ascending transverse colon normal. Descending colon rectosigmoid colon. Vascular/Lymphatic: Abdominal aorta is normal caliber. No periportal or retroperitoneal adenopathy. No pelvic adenopathy. Reproductive: IUD in expected location in the uterus. Adnexa normal. Other: No free fluid. Musculoskeletal: No aggressive osseous lesion. IMPRESSION: 1. Relatively long enteric enteric intussusception in the proximal small bowel measuring approximately 5-6 cm in length. While this may represent a transient finding, in patient with LEFT upper quadrant pain recommend surgical consultation. No lead point identified. Short intussusception present on comparison CT 2020  indicating chronic or recurrent intussusception. 2. Post bariatric surgery with gastric sleeve anatomy noted. 3. No evidence of bowel obstruction. Electronically Signed   By: Genevive Bi M.D.   On: 01/10/2021 15:23    Anti-infectives: Anti-infectives (From admission, onward)    Start     Dose/Rate Route Frequency Ordered Stop   01/10/21 1530  cefTRIAXone (ROCEPHIN) 1 g in sodium chloride 0.9 % 100 mL IVPB        1 g 200 mL/hr over 30 Minutes Intravenous  Once 01/10/21 1528 01/10/21 1630        Assessment/Plan Abdominal pain with intussusception -repeat CT scan 9/27 with interval resolution of entero enteric intussusception of proximal small bowel -will consult GI for possible endoscopy to evaluate for a lead point that has been the etiology of this finding -cont NPO for now  FEN - NPO/IVFs VTE - Lovenox ID - Rocephin x 1 dose for UTI  UTI   LOS: 0 days    Eric Form , [facility] 01/12/2021, 9:08 AM Please see Amion for pager number during day hours 7:00am-4:30pm or 7:00am -11:30am on weekends

## 2021-01-12 NOTE — Consult Note (Addendum)
Referring Provider: [facility] Primary Care Physician:  Leola Brazil, DO Primary Gastroenterologist:  Gentry Fitz  Reason for Consultation:  Abdominal pain, CT with Intussusception  HPI: Patricia Mcmahon is a 35 y.o. 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  Patient states Sunday (9/25) she was laying around and began having a sharp pain in her epigastric area. This then spread to diffuse pain from epigastric area to lower abdomen. She states she waited for it to go away, but it only became progressively worse prompting her to go to the ED. Denies nausea/vomiting. Last BM was yesterday. Denies melena/hematochezia. States she has never had anything like this happen to her before.  States the pain was starting to improve during her hospital stay until this morning around 0500 she felt like the problem was coming back. She states it is not as intense as it was previously, but it is present with waxing and waning.  Denies family history of colon cancer or other GI issues.    Past Medical History:  Diagnosis Date   GERD (gastroesophageal reflux disease)    no current med.   Left knee pain 12/2012    Past Surgical History:  Procedure Laterality Date   CESAREAN SECTION     GASTRECTOMY     sleeve   KNEE ARTHROSCOPY  04/02/2012   Procedure: ARTHROSCOPY KNEE;  Surgeon: Velna Ochs, MD;  Location: [facility];  Service: Orthopedics;  Laterality: Left;  left knee arthroscopy chondroplasty   KNEE ARTHROSCOPY WITH LATERAL RELEASE Left 01/07/2013   Procedure: LEFT KNEE ARTHROSCOPY, CHONDROPLASTY AND  LATERAL RELEASE;  Surgeon: Velna Ochs, MD;  Location:  SURGERY CENTER;  Service: Orthopedics;  Laterality: Left;    Prior to Admission medications   Medication Sig Start Date End Date Taking? Authorizing Provider  acetaminophen (TYLENOL) 325 MG tablet Take 650 mg by mouth every 6 (six) hours as needed (pain).   Yes  [provider]  ibuprofen (ADVIL) 200 MG tablet Take 800 mg by mouth 3 (three) times daily as needed (pain).   Yes [provider]  levonorgestrel (MIRENA) 20 MCG/24HR IUD 1 each by Intrauterine route once. Implanted 1st part of the year 2020   Yes [provider]    Scheduled Meds:  enoxaparin (LOVENOX) injection  40 mg Subcutaneous Q24H   pantoprazole (PROTONIX) IV  40 mg Intravenous Q12H   Continuous Infusions:  sodium chloride 125 mL/hr at 01/12/21 0600   methocarbamol (ROBAXIN) IV     PRN Meds:.diphenhydrAMINE **OR** diphenhydrAMINE, hydrALAZINE, HYDROmorphone (DILAUDID) injection, methocarbamol (ROBAXIN) IV, ondansetron **OR** ondansetron (ZOFRAN) IV, prochlorperazine, simethicone  Allergies as of 01/10/2021 - Review Complete 01/10/2021  Allergen Reaction Noted   Morphine and related Other (See Comments) 03/29/2012    No family history on file.  Social History   Socioeconomic History   Marital status: Married    Spouse name: Not on file   Number of children: Not on file   Years of education: Not on file   Highest education level: Not on file  Occupational History   Not on file  Tobacco Use   Smoking status: Every Day    Packs/day: 1.00    Years: 16.00    Pack years: 16.00    Types: Cigarettes   Smokeless tobacco: Never  Vaping Use   Vaping Use: Never used  Substance and Sexual Activity   Alcohol use: Yes    Comment: socially   Drug use:  No   Sexual activity: Not on file  Other Topics Concern   Not on file  Social History Narrative   Not on file   Social Determinants of Health   Financial Resource Strain: Not on file  Food Insecurity: Not on file  Transportation Needs: Not on file  Physical Activity: Not on file  Stress: Not on file  Social Connections: Not on file  Intimate Partner Violence: Not on file    Review of Systems: Review of Systems  Constitutional:  Negative for chills and fever.  HENT:  Negative for  congestion and sore throat.   Eyes:  Negative for pain and redness.  Respiratory:  Negative for shortness of breath.   Cardiovascular:  Negative for chest pain and palpitations.  Gastrointestinal:  Positive for abdominal pain. Negative for blood in stool, constipation, diarrhea, heartburn, melena, nausea and vomiting.  Genitourinary:  Negative for flank pain and hematuria.  Musculoskeletal:  Negative for falls and joint pain.  Skin:  Negative for itching and rash.  Neurological:  Negative for seizures and loss of consciousness.  Psychiatric/Behavioral:  Negative for substance abuse. The patient is not nervous/anxious.     Physical Exam:Physical Exam Constitutional:      General: She is not in acute distress.    Appearance: She is well-developed. She is not ill-appearing.  HENT:     Head: Normocephalic and atraumatic.     Nose: Nose normal. No congestion.     Mouth/Throat:     Mouth: Mucous membranes are moist.     Pharynx: Oropharynx is clear.  Eyes:     General: No scleral icterus.    Extraocular Movements: Extraocular movements intact.     Conjunctiva/sclera: Conjunctivae normal.  Cardiovascular:     Rate and Rhythm: Normal rate and regular rhythm.  Pulmonary:     Effort: Pulmonary effort is normal. No respiratory distress.  Abdominal:     General: Abdomen is flat. Bowel sounds are normal. There is no distension.     Palpations: Abdomen is soft. There is no mass.     Tenderness: There is abdominal tenderness (epigastric). There is no guarding or rebound.     Hernia: No hernia is present.  Musculoskeletal:        General: No swelling. Normal range of motion.     Cervical back: Normal range of motion and neck supple.  Skin:    General: Skin is warm and dry.  Neurological:     General: No focal deficit present.     Mental Status: She is alert and oriented to person, place, and time.  Psychiatric:        Mood and Affect: Mood normal.        Behavior: Behavior normal.         Thought Content: Thought content normal.        Judgment: Judgment normal.    Vital signs: Vitals:   01/11/21 2058 01/12/21 0629  BP: 102/61 102/60  Pulse: 77 71  Resp: 18 18  Temp: 98.7 F (37.1 C) 98 F (36.7 C)  SpO2: 99% 100%   Last BM Date: 01/10/21    GI:  Lab Results: Recent Labs    01/10/21 1335 01/11/21 0454 01/12/21 0447  WBC 19.9* 12.1* 6.5  HGB 14.3 11.9* 11.3*  HCT 41.7 35.5* 34.6*  PLT 157 121* 120*   BMET Recent Labs    01/10/21 1335 01/11/21 0454 01/12/21 0447  NA 135 137 138  K 3.5 3.5 3.6  CL 101  106 113*  CO2 25 24 17*  GLUCOSE 109* 84 56*  BUN 10 8 9   CREATININE 0.76 0.73 0.76  CALCIUM 9.7 9.1 8.6*   LFT Recent Labs    01/10/21 1335  PROT 7.0  ALBUMIN 3.8  AST 12*  ALT 11  ALKPHOS 55  BILITOT 1.5*   PT/INR No results for input(s): LABPROT, INR in the last 72 hours.   Studies/Results: CT ABDOMEN PELVIS WO CONTRAST  Result Date: 01/11/2021 CLINICAL DATA:  enteroenteric intussusception. EXAM: CT ABDOMEN AND PELVIS WITHOUT CONTRAST TECHNIQUE: Multidetector CT imaging of the abdomen and pelvis was performed following the standard protocol without IV contrast. COMPARISON:  CT abdomen and pelvis dated January 10 2021 FINDINGS: Lower chest: No acute abnormality. Hepatobiliary: No focal liver abnormality. Cholelithiasis. Vicarious excretion of contrast into the gallbladder. No biliary ductal dilation. Pancreas: Unremarkable. Spleen: Unremarkable. Adrenals/Urinary Tract: Adrenal glands are unremarkable. Kidneys are normal, without renal calculi, focal lesion, or hydronephrosis. Bladder is unremarkable. Stomach/Bowel: Prior sleeve gastrectomy. Contrast material is seen in the stomach to the level of the splenic flexure. Interval resolution of entero enteric intussusception of the proximal small bowel. Mild wall thickening of the proximal small bowel. Appendix is unremarkable. No bowel wall thickening, obstruction or adjacent inflammatory  change. Vascular/Lymphatic: Involve atherosclerotic disease of the abdominal aorta. No pathologically enlarged lymph nodes in abdomen or pelvis. Reproductive: IUD in expected location in the uterus. Bilateral adnexa are unremarkable. Other: No abdominal wall hernia or abnormality. Trace free fluid in the pelvis. Musculoskeletal: No acute or significant osseous findings. IMPRESSION: Interval resolution of entero enteric intussusception of the proximal small bowel. Mild wall thickening of the proximal small bowel, findings can be seen in the setting of enteritis. Trace free fluid in the pelvis. Electronically Signed   By: 01-07-1991 M.D.   On: 01/11/2021 16:52   CT ABDOMEN PELVIS W CONTRAST  Result Date: 01/10/2021 CLINICAL DATA:  Lt sided abd pain that started yesterday, radiates into both hips, n/v/d. ^9mL OMNIPAQUE IOHEXOL 350 MG/ML SOLNAbdominal pain, acute, nonlocalized EXAM: CT ABDOMEN AND PELVIS WITH CONTRAST TECHNIQUE: Multidetector CT imaging of the abdomen and pelvis was performed using the standard protocol following bolus administration of intravenous contrast. CONTRAST:  75mL OMNIPAQUE IOHEXOL 350 MG/ML SOLN COMPARISON:  None. FINDINGS: Lower chest: Lung bases are clear. Hepatobiliary: No focal hepatic lesion. Several gallstones are present without evidence acute cholecystitis. Stones are relatively large at 8 mm each. Common bile duct normal caliber. Pancreas: Pancreas is normal. No ductal dilatation. No pancreatic inflammation. Spleen: Normal spleen Adrenals/urinary tract: Adrenal glands and kidneys are normal. The ureters and bladder normal. Stomach/Bowel: Post gastric sleeve bariatric surgery anatomy. Stomach appears normal. Duodenum is normal. In the proximal small bowel at the level of the ligament of Treitz there is a enteric enteric intussusception measuring approximately 5 cm in length and well seen on coronal image 55/series 5. There is no bowel dilatation proximal to this option.  Distally the small bowel appears normal. There is no mass lesion identified. On comparison CT from 12/19/2018 there is a short intussusception at this same level measuring approximately 2 cm (image 59/series 5, 01/15/2019). The jejunum is normal. Terminal ileum normal. Appendix normal. Ascending transverse colon normal. Descending colon rectosigmoid colon. Vascular/Lymphatic: Abdominal aorta is normal caliber. No periportal or retroperitoneal adenopathy. No pelvic adenopathy. Reproductive: IUD in expected location in the uterus. Adnexa normal. Other: No free fluid. Musculoskeletal: No aggressive osseous lesion. IMPRESSION: 1. Relatively long enteric enteric intussusception in the proximal small bowel  measuring approximately 5-6 cm in length. While this may represent a transient finding, in patient with LEFT upper quadrant pain recommend surgical consultation. No lead point identified. 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 2. Post bariatric surgery with gastric sleeve anatomy noted. 3. No evidence of bowel obstruction. Electronically Signed   By: Genevive Bi M.D.   On: 01/10/2021 15:23    Impression: Abdominal pain, CT with Intussusception - CT Ab/Pelvis w contrast 01/10/21: Relatively long enteric enteric intussusception in the proximal small bowel measuring approximately 5-6 cm in length. While this may represent a transient finding, in patient with LEFT upper quadrant pain recommend surgical consultation. No lead point identified. 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 Post bariatric surgery with gastric sleeve anatomy noted. No evidence of bowel obstruction. - CT ab/pelvis 9/27: Interval resolution of entero enteric intussusception of the proximal small bowel. Mild wall thickening of the proximal small bowel, findings can be seen in the setting of enteritis.Trace free fluid in the  pelvis. - Leukocytosis has resolved. Now 6.5 (improved from 19.9 9/26)   Anemia - Hgb 11.3 (decreased from baseline 14.3 9/26) - Normal renal function BUN 9, Cr 0.76  Plan:  Plan for EGD 9/30. I thoroughly discussed the procedures to include nature, alternatives, benefits, and risks including but not limited to bleeding, perforation, infection, anesthesia/cardiac and pulmonary complications. Patient provides understanding and gave verbal consent to proceed.    Can continue clear liquids, NPO at midnight in case spot opens up for procedure tomorrow.  Continue supportive care  [facility] will follow   LOS: 0 days   Vergie Zahm Leanna Sato  PA-C 01/12/2021, 11:14 AM  Contact #  [phone]

## 2021-01-12 NOTE — H&P (View-Only) (Signed)
Referring Provider: [facility] Primary Care Physician:  Leola Brazil, DO Primary Gastroenterologist:  Gentry Fitz  Reason for Consultation:  Abdominal pain, CT with Intussusception  HPI: Patricia Mcmahon is a 35 y.o. 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  Patient states Sunday (9/25) she was laying around and began having a sharp pain in her epigastric area. This then spread to diffuse pain from epigastric area to lower abdomen. She states she waited for it to go away, but it only became progressively worse prompting her to go to the ED. Denies nausea/vomiting. Last BM was yesterday. Denies melena/hematochezia. States she has never had anything like this happen to her before.  States the pain was starting to improve during her hospital stay until this morning around 0500 she felt like the problem was coming back. She states it is not as intense as it was previously, but it is present with waxing and waning.  Denies family history of colon cancer or other GI issues.    Past Medical History:  Diagnosis Date   GERD (gastroesophageal reflux disease)    no current med.   Left knee pain 12/2012    Past Surgical History:  Procedure Laterality Date   CESAREAN SECTION     GASTRECTOMY     sleeve   KNEE ARTHROSCOPY  04/02/2012   Procedure: ARTHROSCOPY KNEE;  Surgeon: Velna Ochs, MD;  Location: [facility];  Service: Orthopedics;  Laterality: Left;  left knee arthroscopy chondroplasty   KNEE ARTHROSCOPY WITH LATERAL RELEASE Left 01/07/2013   Procedure: LEFT KNEE ARTHROSCOPY, CHONDROPLASTY AND  LATERAL RELEASE;  Surgeon: Velna Ochs, MD;  Location:  SURGERY CENTER;  Service: Orthopedics;  Laterality: Left;    Prior to Admission medications   Medication Sig Start Date End Date Taking? Authorizing Provider  acetaminophen (TYLENOL) 325 MG tablet Take 650 mg by mouth every 6 (six) hours as needed (pain).   Yes  [provider]  ibuprofen (ADVIL) 200 MG tablet Take 800 mg by mouth 3 (three) times daily as needed (pain).   Yes [provider]  levonorgestrel (MIRENA) 20 MCG/24HR IUD 1 each by Intrauterine route once. Implanted 1st part of the year 2020   Yes [provider]    Scheduled Meds:  enoxaparin (LOVENOX) injection  40 mg Subcutaneous Q24H   pantoprazole (PROTONIX) IV  40 mg Intravenous Q12H   Continuous Infusions:  sodium chloride 125 mL/hr at 01/12/21 0600   methocarbamol (ROBAXIN) IV     PRN Meds:.diphenhydrAMINE **OR** diphenhydrAMINE, hydrALAZINE, HYDROmorphone (DILAUDID) injection, methocarbamol (ROBAXIN) IV, ondansetron **OR** ondansetron (ZOFRAN) IV, prochlorperazine, simethicone  Allergies as of 01/10/2021 - Review Complete 01/10/2021  Allergen Reaction Noted   Morphine and related Other (See Comments) 03/29/2012    No family history on file.  Social History   Socioeconomic History   Marital status: Married    Spouse name: Not on file   Number of children: Not on file   Years of education: Not on file   Highest education level: Not on file  Occupational History   Not on file  Tobacco Use   Smoking status: Every Day    Packs/day: 1.00    Years: 16.00    Pack years: 16.00    Types: Cigarettes   Smokeless tobacco: Never  Vaping Use   Vaping Use: Never used  Substance and Sexual Activity   Alcohol use: Yes    Comment: socially   Drug use:  No   Sexual activity: Not on file  Other Topics Concern   Not on file  Social History Narrative   Not on file   Social Determinants of Health   Financial Resource Strain: Not on file  Food Insecurity: Not on file  Transportation Needs: Not on file  Physical Activity: Not on file  Stress: Not on file  Social Connections: Not on file  Intimate Partner Violence: Not on file    Review of Systems: Review of Systems  Constitutional:  Negative for chills and fever.  HENT:  Negative for  congestion and sore throat.   Eyes:  Negative for pain and redness.  Respiratory:  Negative for shortness of breath.   Cardiovascular:  Negative for chest pain and palpitations.  Gastrointestinal:  Positive for abdominal pain. Negative for blood in stool, constipation, diarrhea, heartburn, melena, nausea and vomiting.  Genitourinary:  Negative for flank pain and hematuria.  Musculoskeletal:  Negative for falls and joint pain.  Skin:  Negative for itching and rash.  Neurological:  Negative for seizures and loss of consciousness.  Psychiatric/Behavioral:  Negative for substance abuse. The patient is not nervous/anxious.     Physical Exam:Physical Exam Constitutional:      General: She is not in acute distress.    Appearance: She is well-developed. She is not ill-appearing.  HENT:     Head: Normocephalic and atraumatic.     Nose: Nose normal. No congestion.     Mouth/Throat:     Mouth: Mucous membranes are moist.     Pharynx: Oropharynx is clear.  Eyes:     General: No scleral icterus.    Extraocular Movements: Extraocular movements intact.     Conjunctiva/sclera: Conjunctivae normal.  Cardiovascular:     Rate and Rhythm: Normal rate and regular rhythm.  Pulmonary:     Effort: Pulmonary effort is normal. No respiratory distress.  Abdominal:     General: Abdomen is flat. Bowel sounds are normal. There is no distension.     Palpations: Abdomen is soft. There is no mass.     Tenderness: There is abdominal tenderness (epigastric). There is no guarding or rebound.     Hernia: No hernia is present.  Musculoskeletal:        General: No swelling. Normal range of motion.     Cervical back: Normal range of motion and neck supple.  Skin:    General: Skin is warm and dry.  Neurological:     General: No focal deficit present.     Mental Status: She is alert and oriented to person, place, and time.  Psychiatric:        Mood and Affect: Mood normal.        Behavior: Behavior normal.         Thought Content: Thought content normal.        Judgment: Judgment normal.    Vital signs: Vitals:   01/11/21 2058 01/12/21 0629  BP: 102/61 102/60  Pulse: 77 71  Resp: 18 18  Temp: 98.7 F (37.1 C) 98 F (36.7 C)  SpO2: 99% 100%   Last BM Date: 01/10/21    GI:  Lab Results: Recent Labs    01/10/21 1335 01/11/21 0454 01/12/21 0447  WBC 19.9* 12.1* 6.5  HGB 14.3 11.9* 11.3*  HCT 41.7 35.5* 34.6*  PLT 157 121* 120*   BMET Recent Labs    01/10/21 1335 01/11/21 0454 01/12/21 0447  NA 135 137 138  K 3.5 3.5 3.6  CL 101  106 113*  CO2 25 24 17*  GLUCOSE 109* 84 56*  BUN 10 8 9   CREATININE 0.76 0.73 0.76  CALCIUM 9.7 9.1 8.6*   LFT Recent Labs    01/10/21 1335  PROT 7.0  ALBUMIN 3.8  AST 12*  ALT 11  ALKPHOS 55  BILITOT 1.5*   PT/INR No results for input(s): LABPROT, INR in the last 72 hours.   Studies/Results: CT ABDOMEN PELVIS WO CONTRAST  Result Date: 01/11/2021 CLINICAL DATA:  enteroenteric intussusception. EXAM: CT ABDOMEN AND PELVIS WITHOUT CONTRAST TECHNIQUE: Multidetector CT imaging of the abdomen and pelvis was performed following the standard protocol without IV contrast. COMPARISON:  CT abdomen and pelvis dated January 10 2021 FINDINGS: Lower chest: No acute abnormality. Hepatobiliary: No focal liver abnormality. Cholelithiasis. Vicarious excretion of contrast into the gallbladder. No biliary ductal dilation. Pancreas: Unremarkable. Spleen: Unremarkable. Adrenals/Urinary Tract: Adrenal glands are unremarkable. Kidneys are normal, without renal calculi, focal lesion, or hydronephrosis. Bladder is unremarkable. Stomach/Bowel: Prior sleeve gastrectomy. Contrast material is seen in the stomach to the level of the splenic flexure. Interval resolution of entero enteric intussusception of the proximal small bowel. Mild wall thickening of the proximal small bowel. Appendix is unremarkable. No bowel wall thickening, obstruction or adjacent inflammatory  change. Vascular/Lymphatic: Involve atherosclerotic disease of the abdominal aorta. No pathologically enlarged lymph nodes in abdomen or pelvis. Reproductive: IUD in expected location in the uterus. Bilateral adnexa are unremarkable. Other: No abdominal wall hernia or abnormality. Trace free fluid in the pelvis. Musculoskeletal: No acute or significant osseous findings. IMPRESSION: Interval resolution of entero enteric intussusception of the proximal small bowel. Mild wall thickening of the proximal small bowel, findings can be seen in the setting of enteritis. Trace free fluid in the pelvis. Electronically Signed   By: 01-07-1991 M.D.   On: 01/11/2021 16:52   CT ABDOMEN PELVIS W CONTRAST  Result Date: 01/10/2021 CLINICAL DATA:  Lt sided abd pain that started yesterday, radiates into both hips, n/v/d. ^9mL OMNIPAQUE IOHEXOL 350 MG/ML SOLNAbdominal pain, acute, nonlocalized EXAM: CT ABDOMEN AND PELVIS WITH CONTRAST TECHNIQUE: Multidetector CT imaging of the abdomen and pelvis was performed using the standard protocol following bolus administration of intravenous contrast. CONTRAST:  75mL OMNIPAQUE IOHEXOL 350 MG/ML SOLN COMPARISON:  None. FINDINGS: Lower chest: Lung bases are clear. Hepatobiliary: No focal hepatic lesion. Several gallstones are present without evidence acute cholecystitis. Stones are relatively large at 8 mm each. Common bile duct normal caliber. Pancreas: Pancreas is normal. No ductal dilatation. No pancreatic inflammation. Spleen: Normal spleen Adrenals/urinary tract: Adrenal glands and kidneys are normal. The ureters and bladder normal. Stomach/Bowel: Post gastric sleeve bariatric surgery anatomy. Stomach appears normal. Duodenum is normal. In the proximal small bowel at the level of the ligament of Treitz there is a enteric enteric intussusception measuring approximately 5 cm in length and well seen on coronal image 55/series 5. There is no bowel dilatation proximal to this option.  Distally the small bowel appears normal. There is no mass lesion identified. On comparison CT from 12/19/2018 there is a short intussusception at this same level measuring approximately 2 cm (image 59/series 5, 01/15/2019). The jejunum is normal. Terminal ileum normal. Appendix normal. Ascending transverse colon normal. Descending colon rectosigmoid colon. Vascular/Lymphatic: Abdominal aorta is normal caliber. No periportal or retroperitoneal adenopathy. No pelvic adenopathy. Reproductive: IUD in expected location in the uterus. Adnexa normal. Other: No free fluid. Musculoskeletal: No aggressive osseous lesion. IMPRESSION: 1. Relatively long enteric enteric intussusception in the proximal small bowel  measuring approximately 5-6 cm in length. While this may represent a transient finding, in patient with LEFT upper quadrant pain recommend surgical consultation. No lead point identified. 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 2. Post bariatric surgery with gastric sleeve anatomy noted. 3. No evidence of bowel obstruction. Electronically Signed   By: Stewart  Edmunds M.D.   On: 01/10/2021 15:23    Impression: Abdominal pain, CT with Intussusception - CT Ab/Pelvis w contrast 01/10/21: Relatively long enteric enteric intussusception in the proximal small bowel measuring approximately 5-6 cm in length. While this may represent a transient finding, in patient with LEFT upper quadrant pain recommend surgical consultation. No lead point identified. 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 Post bariatric surgery with gastric sleeve anatomy noted. No evidence of bowel obstruction. - CT ab/pelvis 9/27: Interval resolution of entero enteric intussusception of the proximal small bowel. Mild wall thickening of the proximal small bowel, findings can be seen in the setting of enteritis.Trace free fluid in the  pelvis. - Leukocytosis has resolved. Now 6.5 (improved from 19.9 9/26)   Anemia - Hgb 11.3 (decreased from baseline 14.3 9/26) - Normal renal function BUN 9, Cr 0.76  Plan:  Plan for EGD 9/30. I thoroughly discussed the procedures to include nature, alternatives, benefits, and risks including but not limited to bleeding, perforation, infection, anesthesia/cardiac and pulmonary complications. Patient provides understanding and gave verbal consent to proceed.    Can continue clear liquids, NPO at midnight in case spot opens up for procedure tomorrow.  Continue supportive care  [facility] will follow   LOS: 0 days   Lillyanne Bradburn M Kitt Ledet  PA-C 01/12/2021, 11:14 AM  Contact #  [phone]  

## 2021-01-12 NOTE — Progress Notes (Signed)
Date and time results received: 01/12/21 0550  Test: Glucose Critical Value: 47  Name of Provider Notified: Barrie Dunker  Orders Received? Or Actions Taken?:   Hypoglycemia protocol initiated. Final glucose noted at 101

## 2021-01-13 MED ORDER — NAPHAZOLINE-PHENIRAMINE 0.025-0.3 % OP SOLN
1.0000 [drp] | Freq: Four times a day (QID) | OPHTHALMIC | Status: DC | PRN
  Filled 2021-01-13: qty 15

## 2021-01-13 NOTE — Progress Notes (Signed)
[facility] Gastroenterology Progress Note  Patricia Mcmahon 35 y.o. 03-23-1986  CC:  Abdominal pain, intussusception  Subjective: Patient reports mild abdominal pain.  Denies nausea/vomiting.    ROS : Review of Systems  Cardiovascular:  Negative for chest pain and palpitations.  Gastrointestinal:  Positive for abdominal pain. Negative for blood in stool, constipation, diarrhea, heartburn, melena, nausea and vomiting.     Objective: Vital signs in last 24 hours: Vitals:   01/12/21 2045 01/13/21 0541  BP: 113/67 109/71  Pulse: (!) 57 (!) 53  Resp: 18 16  Temp: 97.7 F (36.5 C) 98.2 F (36.8 C)  SpO2: 100% 99%    Physical Exam:  General:  Sleeping but easily aroused, cooperative, no distress  Head:  Normocephalic, without obvious abnormality, atraumatic  Eyes:  Anicteric sclera, EOMs intact  Lungs:   Clear to auscultation bilaterally, respirations unlabored  Heart:  Regular rate and rhythm, S1, S2 normal  Abdomen:   Soft, mild epigastric and LUQ tenderness to palpation, normoactive bowel sounds, no peritoneal signs  Extremities: Extremities normal, atraumatic, no  edema    Lab Results: Recent Labs    01/11/21 0454 01/12/21 0447  NA 137 138  K 3.5 3.6  CL 106 113*  CO2 24 17*  GLUCOSE 84 56*  BUN 8 9  CREATININE 0.73 0.76  CALCIUM 9.1 8.6*   Recent Labs    01/10/21 1335  AST 12*  ALT 11  ALKPHOS 55  BILITOT 1.5*  PROT 7.0  ALBUMIN 3.8   Recent Labs    01/10/21 1335 01/11/21 0454 01/12/21 0447  WBC 19.9* 12.1* 6.5  NEUTROABS 18.0*  --   --   HGB 14.3 11.9* 11.3*  HCT 41.7 35.5* 34.6*  MCV 95.2 98.1 99.4  PLT 157 121* 120*   No results for input(s): LABPROT, INR in the last 72 hours.    Assessment: Abdominal pain, CT with Intussusception - CT Abdomen/Pelvis w contrast 01/10/21: Relatively long enteric enteric intussusception in the proximal small bowel measuring approximately 5-6 cm in length. Short intussusception present on comparison CT 2020  indicating chronic or recurrent intussusception. No evidence of bowel obstruction. - CT abdomen/pelvis 9/27: Interval resolution of entero enteric intussusception of the proximal small bowel. Mild wall thickening of the proximal small bowel, findings can be seen in the setting of enteritis.Trace free fluid in the pelvis. - Leukocytosis has resolved, WBCs 6.5 as of 9/28 (improved from 19.9 9/26)  History of gastric sleeve  Plan: Proceed with EGD tomorrow as scheduled.  Clear liquid diet, NPO at midnight.  Continue Protonix BID.  [facility] will follow.   Edrick Kins PA-C 01/13/2021, 10:08 AM  Contact #  [phone]

## 2021-01-13 NOTE — Plan of Care (Signed)
  Problem: Activity: Goal: Risk for activity intolerance will decrease Outcome: Progressing   Problem: Coping: Goal: Level of anxiety will decrease Outcome: Progressing   Problem: Pain Managment: Goal: General experience of comfort will improve Outcome: Progressing   Problem: Safety: Goal: Ability to remain free from injury will improve Outcome: Progressing   

## 2021-01-13 NOTE — Progress Notes (Signed)
Subjective: Pain remains improved. Passing flatus. No BM for a few days. No further nausea or emesis. Ambulating.  ROS: See above, otherwise other systems negative  Objective: Vital signs in last 24 hours: Temp:  [97.7 F (36.5 C)-98.2 F (36.8 C)] 98.2 F (36.8 C) (09/29 0541) Pulse Rate:  [53-70] 53 (09/29 0541) Resp:  [16-18] 16 (09/29 0541) BP: (109-113)/(67-73) 109/71 (09/29 0541) SpO2:  [99 %-100 %] 99 % (09/29 0541) Last BM Date: 01/10/21  Intake/Output from previous day: 09/28 0701 - 09/29 0700 In: -  Out: 1100 [Urine:1100] Intake/Output this shift: No intake/output data recorded.  PE: Heart: regular rate and rhythm  Lungs: CTAB Abd: soft, very mild TTP LUQ, no peritonitis or guarding, normal BS, ND MSK: calves soft. No lower extremity edema bilaterally Skin: warm and dry Psych: A&Ox3, normal affect  Lab Results:  Recent Labs    01/11/21 0454 01/12/21 0447  WBC 12.1* 6.5  HGB 11.9* 11.3*  HCT 35.5* 34.6*  PLT 121* 120*    BMET Recent Labs    01/11/21 0454 01/12/21 0447  NA 137 138  K 3.5 3.6  CL 106 113*  CO2 24 17*  GLUCOSE 84 56*  BUN 8 9  CREATININE 0.73 0.76  CALCIUM 9.1 8.6*    PT/INR No results for input(s): LABPROT, INR in the last 72 hours. CMP     Component Value Date/Time   NA 138 01/12/2021 0447   K 3.6 01/12/2021 0447   CL 113 (H) 01/12/2021 0447   CO2 17 (L) 01/12/2021 0447   GLUCOSE 56 (L) 01/12/2021 0447   BUN 9 01/12/2021 0447   CREATININE 0.76 01/12/2021 0447   CALCIUM 8.6 (L) 01/12/2021 0447   PROT 7.0 01/10/2021 1335   ALBUMIN 3.8 01/10/2021 1335   AST 12 (L) 01/10/2021 1335   ALT 11 01/10/2021 1335   ALKPHOS 55 01/10/2021 1335   BILITOT 1.5 (H) 01/10/2021 1335   GFRNONAA >60 01/12/2021 0447   GFRAA >60 01/16/2019 1608   Lipase     Component Value Date/Time   LIPASE 23 01/10/2021 1335       Studies/Results: CT ABDOMEN PELVIS WO CONTRAST  Result Date: 01/11/2021 CLINICAL DATA:   enteroenteric intussusception. EXAM: CT ABDOMEN AND PELVIS WITHOUT CONTRAST TECHNIQUE: Multidetector CT imaging of the abdomen and pelvis was performed following the standard protocol without IV contrast. COMPARISON:  CT abdomen and pelvis dated January 10 2021 FINDINGS: Lower chest: No acute abnormality. Hepatobiliary: No focal liver abnormality. Cholelithiasis. Vicarious excretion of contrast into the gallbladder. No biliary ductal dilation. Pancreas: Unremarkable. Spleen: Unremarkable. Adrenals/Urinary Tract: Adrenal glands are unremarkable. Kidneys are normal, without renal calculi, focal lesion, or hydronephrosis. Bladder is unremarkable. Stomach/Bowel: Prior sleeve gastrectomy. Contrast material is seen in the stomach to the level of the splenic flexure. Interval resolution of entero enteric intussusception of the proximal small bowel. Mild wall thickening of the proximal small bowel. Appendix is unremarkable. No bowel wall thickening, obstruction or adjacent inflammatory change. Vascular/Lymphatic: Involve atherosclerotic disease of the abdominal aorta. No pathologically enlarged lymph nodes in abdomen or pelvis. Reproductive: IUD in expected location in the uterus. Bilateral adnexa are unremarkable. Other: No abdominal wall hernia or abnormality. Trace free fluid in the pelvis. Musculoskeletal: No acute or significant osseous findings. IMPRESSION: Interval resolution of entero enteric intussusception of the proximal small bowel. Mild wall thickening of the proximal small bowel, findings can be seen in the setting of enteritis. Trace free fluid in the pelvis. Electronically Signed  By: Allegra Lai M.D.   On: 01/11/2021 16:52    Anti-infectives: Anti-infectives (From admission, onward)    Start     Dose/Rate Route Frequency Ordered Stop   01/10/21 1530  cefTRIAXone (ROCEPHIN) 1 g in sodium chloride 0.9 % 100 mL IVPB        1 g 200 mL/hr over 30 Minutes Intravenous  Once 01/10/21 1528 01/10/21  1630        Assessment/Plan Abdominal pain with intussusception -repeat CT scan 9/27 with interval resolution of entero enteric intussusception of proximal small bowel -GI on consult and planning EGD tomorrow. CLD for now  FEN - CLD, NPO MN VTE - Lovenox ID - Rocephin x 1 dose for UTI  UTI   LOS: 1 day    Eric Form , [facility] 01/13/2021, 9:51 AM Please see Amion for pager number during day hours 7:00am-4:30pm or 7:00am -11:30am on weekends

## 2021-01-14 ENCOUNTER — Inpatient Hospital Stay (HOSPITAL_COMMUNITY): Payer: BC Managed Care – PPO | Admitting: Anesthesiology

## 2021-01-14 ENCOUNTER — Encounter (HOSPITAL_COMMUNITY): Admission: EM | Disposition: A | Discharge status: Home / Self Care | Payer: Self-pay | Source: Home / Self Care

## 2021-01-14 ENCOUNTER — Encounter (HOSPITAL_COMMUNITY): Payer: Self-pay

## 2021-01-14 HISTORY — PX: ESOPHAGOGASTRODUODENOSCOPY: SHX5428

## 2021-01-14 SURGERY — EGD (ESOPHAGOGASTRODUODENOSCOPY)
Anesthesia: Monitor Anesthesia Care

## 2021-01-14 MED ORDER — PROPOFOL 10 MG/ML IV BOLUS
INTRAVENOUS | Status: DC | PRN
  Administered 2021-01-14 (×3): 20 mg via INTRAVENOUS
  Administered 2021-01-14: 30 mg via INTRAVENOUS

## 2021-01-14 MED ORDER — LACTATED RINGERS IV SOLN
INTRAVENOUS | Status: AC | PRN
  Administered 2021-01-14: 1000 mL via INTRAVENOUS

## 2021-01-14 MED ORDER — PROPOFOL 500 MG/50ML IV EMUL
INTRAVENOUS | Status: DC | PRN
  Administered 2021-01-14: 150 ug/kg/min via INTRAVENOUS

## 2021-01-14 MED ORDER — SODIUM CHLORIDE 0.9 % IV SOLN: INTRAVENOUS | Status: DC

## 2021-01-14 NOTE — Progress Notes (Signed)
       Subjective: Pain improved and stable. No further nausea or emesis. Passing flatus and had several Bms. Ambulating.  ROS: See above, otherwise other systems negative  Objective: Vital signs in last 24 hours: Temp:  [97.4 F (36.3 C)-98.1 F (36.7 C)] 97.4 F (36.3 C) (09/30 0448) Pulse Rate:  [65-73] 73 (09/30 0448) Resp:  [16-18] 16 (09/30 0448) BP: (117-129)/(75-89) 119/89 (09/30 0448) SpO2:  [99 %-100 %] 99 % (09/30 0448) Last BM Date: 01/13/21  Intake/Output from previous day: 09/29 0701 - 09/30 0700 In: 360 [P.O.:360] Out: 900 [Urine:900] Intake/Output this shift: No intake/output data recorded.  PE: Heart: regular rate and rhythm  Lungs: CTAB Abd: soft, very mild TTP LUQ, no peritonitis or guarding, normal BS, ND MSK: calves soft. No lower extremity edema bilaterally Skin: warm and dry Psych: A&Ox3, normal affect  Lab Results:  Recent Labs    01/12/21 0447  WBC 6.5  HGB 11.3*  HCT 34.6*  PLT 120*    BMET Recent Labs    01/12/21 0447  NA 138  K 3.6  CL 113*  CO2 17*  GLUCOSE 56*  BUN 9  CREATININE 0.76  CALCIUM 8.6*    PT/INR No results for input(s): LABPROT, INR in the last 72 hours. CMP     Component Value Date/Time   NA 138 01/12/2021 0447   K 3.6 01/12/2021 0447   CL 113 (H) 01/12/2021 0447   CO2 17 (L) 01/12/2021 0447   GLUCOSE 56 (L) 01/12/2021 0447   BUN 9 01/12/2021 0447   CREATININE 0.76 01/12/2021 0447   CALCIUM 8.6 (L) 01/12/2021 0447   PROT 7.0 01/10/2021 1335   ALBUMIN 3.8 01/10/2021 1335   AST 12 (L) 01/10/2021 1335   ALT 11 01/10/2021 1335   ALKPHOS 55 01/10/2021 1335   BILITOT 1.5 (H) 01/10/2021 1335   GFRNONAA >60 01/12/2021 0447   GFRAA >60 01/16/2019 1608   Lipase     Component Value Date/Time   LIPASE 23 01/10/2021 1335       Studies/Results: No results found.  Anti-infectives: Anti-infectives (From admission, onward)    Start     Dose/Rate Route Frequency Ordered Stop   01/10/21 1530   cefTRIAXone (ROCEPHIN) 1 g in sodium chloride 0.9 % 100 mL IVPB        1 g 200 mL/hr over 30 Minutes Intravenous  Once 01/10/21 1528 01/10/21 1630        Assessment/Plan Abdominal pain with intussusception -repeat CT scan 9/27 with interval resolution of entero enteric intussusception of proximal small bowel -GI on consult and planning EGD today  FEN - NPO for EGD VTE - Lovenox ID - Rocephin x 1 dose for UTI  UTI   LOS: 2 days    Eric Form , [facility] Surgery 01/14/2021, 9:11 AM Please see Amion for pager number during day hours 7:00am-4:30pm or 7:00am -11:30am on weekends

## 2021-01-14 NOTE — Discharge Summary (Signed)
Physician Discharge Summary   Patient ID: Patricia Mcmahon MRN: [id number] DOB/AGE: 1986/03/16 35 y.o.  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  CT scan demonstrated a 5 to 6 cm segment of intussusception immediately distal to the ligament of Treitz.  Patient was started on intravenous hydration.  She was treated for pain control and control of nausea.  Symptoms improved.  Patient underwent a repeat CT scan which showed resolution of the area of intussusception.  Patient tolerated clear liquids.  She was seen in consultation by gastroenterology and underwent an upper endoscopy today which showed essentially a normal exam.  History is notable for a sleeve gastrectomy approximately 10 years ago in [facility].  There was no sign of mass or other lesion that would represent a lead point for intussusception.  Patient will be discharged home this evening.  She is doing well and tolerating a soft diet.  She will see me in follow-up in 3 weeks.  At that time we will discuss possible further evaluation with a small bowel dedicated study or a capsule endoscopy.  If she has recurrent symptoms, she will contact my office immediately or come to the emergency room at one of the hospitals and undergo CT scan to rule out recurrent intussusception.  Patient understands and agrees with these plans.  Consults: GI  Treatments: IV hydration and procedures: EGD  Discharge Exam: Blood pressure 116/82, pulse 72, temperature 97.6 F (36.4 C), resp. rate 16, height 5\' 9"  (1.753 m), weight 68.5 kg, SpO2 100 %. HEENT - clear Neck - soft Chest - clear bilaterally Cor - RRR Abd - soft, non-tender  Disposition: Home  Discharge Instructions     Diet - low sodium heart healthy    Complete by: As directed    Discharge instructions   Complete by: As directed    [facility] -- DISCHARGE INSTRUCTIONS  REMINDER:   Carry a list of your medications and allergies with you at all times  Call your pharmacy at least 1 week in advance to refill prescriptions  Do not mix any prescribed pain medicine with alcohol  Do not drive any motor vehicles while taking pain medication  Take medications with food unless otherwise directed  Follow-up appointments (date to return to physician): Please call [phone] to confirm your follow up appointment with your surgeon.  Call your Surgeon if you have:  Temperature greater than 101.0  Persistent nausea and vomiting  Severe uncontrolled pain  Redness, tenderness, or signs of infection (pain, swelling, redness, odor or green/yellow discharge around the site)  Difficulty breathing, headache or visual disturbances  Hives  Persistent dizziness or light-headedness  Any other questions or concerns you may have after discharge  In an emergency, call 911 or go to an Emergency Department at a nearby hospital.   Diet: Begin with liquids, and if they are tolerated, resume your usual diet.  Avoid spicy, greasy or heavy foods.  If you have nausea or vomiting, go back to liquids.  If you cannot keep liquids down, call your doctor.  Avoid alcohol consumption while on prescription pain medications. Good nutrition promotes healing. Increase fiber and fluids.   ADDITIONAL INSTRUCTIONS: Contact office if symptoms recur - or come to ER at [facility] or [facility] for immediate [facility] scan.  [facility] Office: [phone]   Increase activity slowly   Complete by: As directed    No wound care   Complete by: As directed       Allergies as of 01/14/2021       Reactions   Morphine And Related Other (See Comments)   Behavior changes - becomes hostile/violent        Medication List     TAKE these medications     acetaminophen 325 MG tablet Commonly known as: TYLENOL Take 650 mg by mouth every 6 (six) hours as needed (pain).   ibuprofen 200 MG tablet Commonly known as: ADVIL Take 800 mg by mouth 3 (three) times daily as needed (pain).   levonorgestrel 20 MCG/24HR IUD Commonly known as: MIRENA 1 each by Intrauterine route once. Implanted 1st part of the year 2020        Follow-up Information     Mya Suell, Tawanna Cooler, MD. Schedule an appointment as soon as possible for a visit in 3 week(s).   Specialty: General Surgery Why: To discuss possible further evaluation and studies. Contact information: [address] [place] [address] [phone]                 Darnell Level, MD [facility] Office: [phone]   Signed: Darnell Level 01/14/2021, 5:19 PM

## 2021-01-14 NOTE — Interval H&P Note (Signed)
History and Physical Interval Note:  01/14/2021 2:33 PM  Patricia Mcmahon  has presented today for surgery, with the diagnosis of Intussusception.  The various methods of treatment have been discussed with the patient and family. After consideration of risks, benefits and other options for treatment, the patient has consented to  Procedure(s): ESOPHAGOGASTRODUODENOSCOPY (EGD) (N/A) as a surgical intervention.  The patient's history has been reviewed, patient examined, no change in status, stable for surgery.  I have reviewed the patient's chart and labs.  Questions were answered to the patient's satisfaction.     Freddy Jaksch

## 2021-01-14 NOTE — Anesthesia Preprocedure Evaluation (Addendum)
Anesthesia Evaluation  Patient identified by MRN, date of birth, ID band Patient awake    Reviewed: Allergy & Precautions, NPO status , [name] Chart, lab work & pertinent test results  Airway Mallampati: I  TM Distance: >3 FB Neck ROM: Full    Dental  (+) Chipped,    Pulmonary neg pulmonary ROS, Current Smoker and Patient abstained from smoking.,    Pulmonary exam normal breath sounds clear to auscultation       Cardiovascular negative cardio ROS Normal cardiovascular exam Rhythm:Regular Rate:Normal     Neuro/Psych negative neurological ROS  negative psych ROS   GI/Hepatic Neg liver ROS, GERD  ,  Endo/Other  negative endocrine ROS  Renal/GU negative Renal ROS  negative genitourinary   Musculoskeletal negative musculoskeletal ROS (+)   Abdominal   Peds  Hematology negative hematology ROS (+)   Anesthesia Other Findings EGD for intussusception, 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  Reproductive/Obstetrics                            Anesthesia Physical Anesthesia Plan  ASA: 2  Anesthesia Plan: MAC   Post-op Pain Management:    Induction: Intravenous  PONV Risk Score and Plan: Propofol infusion and Treatment may vary due to age or medical condition  Airway Management Planned: Natural Airway  Additional Equipment:   Intra-op Plan:   Post-operative Plan:   Informed Consent: I have reviewed the patients History and Physical, chart, labs and discussed the procedure including the risks, benefits and alternatives for the proposed anesthesia with the patient or authorized representative who has indicated his/her understanding and acceptance.     Dental advisory given  Plan Discussed with: CRNA  Anesthesia Plan Comments:         Anesthesia Quick Evaluation

## 2021-01-14 NOTE — Progress Notes (Signed)
Patient was given discharge instructions, and all questions were answered. Patient was stable for discharge and was walked to the main exit. 

## 2021-01-14 NOTE — Plan of Care (Signed)

## 2021-01-14 NOTE — Op Note (Signed)
[facility] Patient Name: Patricia Mcmahon Procedure Date: 01/14/2021 MRN: [id number] Attending MD: Willis Modena , MD Date of Birth: 1985-12-30 CSN: [id number] Age: 35 Admit Type: Inpatient Procedure:                Upper GI endoscopy Indications:              Generalized abdominal pain, Abnormal CT of the GI                            tract Providers:                Willis Modena, MD, Janae Sauce. Steele Berg, RN, Rozetta Nunnery, Technician Referring MD:             [facility] (Dr. Darnell Level) Medicines:                Monitored Anesthesia Care Complications:            No immediate complications. Estimated Blood Loss:     Estimated blood loss: none. Procedure:                Pre-Anesthesia Assessment:                           - Prior to the procedure, a History and Physical                            was performed, and patient medications and                            allergies were reviewed. The patient's tolerance of                            previous anesthesia was also reviewed. The risks                            and benefits of the procedure and the sedation                            options and risks were discussed with the patient.                            All questions were answered, and informed consent                            was obtained. Prior Anticoagulants: The patient has                            taken no previous anticoagulant or antiplatelet                            agents. ASA Grade Assessment: II - A patient with  mild systemic disease. After reviewing the risks                            and benefits, the patient was deemed in                            satisfactory condition to undergo the procedure.                           After obtaining informed consent, the endoscope was                            passed under direct vision. Throughout the                             procedure, the patient's blood pressure, pulse, and                            oxygen saturations were monitored continuously. The                            [id number]-[id number] ([id number]) Olympus endoscope was introduced                            through the mouth, and advanced to the fourth part                            of duodenum. The upper GI endoscopy was                            accomplished without difficulty. The patient                            tolerated the procedure well. Scope In: Scope Out: Findings:      The examined esophagus was normal.      Expected sleeve gastrectomy anatomy without ulcer or other abnormality,       retroflexion not performed.      The exam of the stomach was otherwise normal.      The first portion of the duodenum, second portion of the duodenum, third       portion of the duodenum and fourth portion of the duodenum were normal. Impression:               - Normal esophagus.                           - Normal-appearing sleeve gastrectomy anatomy                           - Normal first portion of the duodenum, second                            portion of the duodenum, third portion of the  duodenum and fourth portion of the duodenum.                           - No lead-point for intussusception seen. Moderate Sedation:      Not Applicable - Patient had care per Anesthesia. Recommendation:           - Return patient to hospital ward for ongoing care.                           - Soft diet today.                           - Continue present medications.                           [facility] will sign-off; happy to see for                            outpatient follow-up with [place] as needed-; please                            call with any questions; thank you for the                            consultation. Procedure Code(s):        --- Professional ---                           [phone], Esophagogastroduodenoscopy, flexible,                             transoral; diagnostic, including collection of                            specimen(s) by brushing or washing, when performed                            (separate procedure) Diagnosis Code(s):        --- Professional ---                           R10.84, Generalized abdominal pain                           R93.3, Abnormal findings on diagnostic imaging of                            other parts of digestive tract CPT copyright 2019 [employer]. All rights reserved. The codes documented in this report are preliminary and upon coder review may  be revised to meet current compliance requirements. Willis Modena, MD 01/14/2021 3:14:39 PM This report has been signed electronically. Number of Addenda: 0

## 2021-01-14 NOTE — Transfer of Care (Signed)
Immediate Anesthesia Transfer of Care Note  Patient: KATELAND LEISINGER  Procedure(s) Performed: ESOPHAGOGASTRODUODENOSCOPY (EGD)  Patient Location: PACU  Anesthesia Type:MAC  Level of Consciousness: awake, alert  and oriented  Airway & Oxygen Therapy: Patient Spontanous Breathing and Patient connected to face mask oxygen  Post-op Assessment: Report given to RN, Post -op Vital signs reviewed and stable and Patient moving all extremities X 4  Post vital signs: Reviewed and stable  Last Vitals:  Vitals Value Taken Time  BP 92/67   Temp    Pulse 67   Resp 14   SpO2 100     Last Pain:  Vitals:   01/14/21 1324  TempSrc: Oral  PainSc: 4       Patients Stated Pain Goal: 2 (99/83/38 2505)  Complications: No notable events documented.

## 2021-01-14 NOTE — Anesthesia Procedure Notes (Signed)
Procedure Name: MAC Date/Time: 01/14/2021 2:47 PM Performed by: Niel Hummer, CRNA Pre-anesthesia Checklist: Patient identified, Emergency Drugs available, Suction available and Patient being monitored Oxygen Delivery Method: Simple face mask

## 2021-01-17 ENCOUNTER — Encounter (HOSPITAL_COMMUNITY): Payer: Self-pay | Admitting: Gastroenterology

## 2021-01-17 NOTE — Anesthesia Postprocedure Evaluation (Addendum)
Anesthesia Post Note  Patient: Patricia Mcmahon  Procedure(s) Performed: ESOPHAGOGASTRODUODENOSCOPY (EGD)     Patient location during evaluation: Endoscopy Anesthesia Type: MAC Level of consciousness: awake and alert Pain management: pain level controlled Vital Signs Assessment: post-procedure vital signs reviewed and stable Respiratory status: spontaneous breathing, nonlabored ventilation, respiratory function stable and patient connected to nasal cannula oxygen Cardiovascular status: blood pressure returned to baseline and stable Postop Assessment: no apparent nausea or vomiting Anesthetic complications: no   No notable events documented.  Last Vitals:  Vitals:   01/14/21 1542 01/14/21 1651  BP: (!) 134/96 116/82  Pulse: 65 72  Resp: 15 16  Temp:  36.4 C  SpO2: 100% 100%    Last Pain:  Vitals:   01/14/21 1542  TempSrc:   PainSc: 3                  Ordean Fouts L Jacqueline Delapena

## 2021-05-04 ENCOUNTER — Other Ambulatory Visit: Payer: Self-pay

## 2021-05-04 ENCOUNTER — Emergency Department (HOSPITAL_BASED_OUTPATIENT_CLINIC_OR_DEPARTMENT_OTHER)
Admission: EM | Admit: 2021-05-04 | Discharge: 2021-05-04 | Disposition: A | Discharge status: Home / Self Care | Payer: Medicaid Other | Attending: Emergency Medicine | Admitting: Emergency Medicine

## 2021-05-04 ENCOUNTER — Emergency Department (HOSPITAL_BASED_OUTPATIENT_CLINIC_OR_DEPARTMENT_OTHER): Payer: Medicaid Other

## 2021-05-04 ENCOUNTER — Encounter (HOSPITAL_BASED_OUTPATIENT_CLINIC_OR_DEPARTMENT_OTHER): Payer: Self-pay

## 2021-05-04 DIAGNOSIS — N12 Tubulo-interstitial nephritis, not specified as acute or chronic: Secondary | ICD-10-CM | POA: Diagnosis not present

## 2021-05-04 DIAGNOSIS — E871 Hypo-osmolality and hyponatremia: Secondary | ICD-10-CM | POA: Diagnosis not present

## 2021-05-04 DIAGNOSIS — R109 Unspecified abdominal pain: Secondary | ICD-10-CM | POA: Diagnosis not present

## 2021-05-04 DIAGNOSIS — R1084 Generalized abdominal pain: Secondary | ICD-10-CM | POA: Diagnosis present

## 2021-05-04 LAB — COMPREHENSIVE METABOLIC PANEL
ALT: 17 U/L (ref 0–44)
AST: 18 U/L (ref 15–41)
Albumin: 3.8 g/dL (ref 3.5–5.0)
Alkaline Phosphatase: 79 U/L (ref 38–126)
Anion gap: 10 (ref 5–15)
BUN: 10 mg/dL (ref 6–20)
CO2: 23 mmol/L (ref 22–32)
Calcium: 9.3 mg/dL (ref 8.9–10.3)
Chloride: 98 mmol/L (ref 98–111)
Creatinine, Ser: 0.79 mg/dL (ref 0.44–1.00)
GFR, Estimated: >60 mL/min (ref >60)
Glucose, Bld: 119 mg/dL — ABNORMAL HIGH (ref 70–99)
Potassium: 4 mmol/L (ref 3.5–5.1)
Sodium: 131 mmol/L — ABNORMAL LOW (ref 135–145)
Total Bilirubin: 0.9 mg/dL (ref 0.3–1.2)
Total Protein: 7.8 g/dL (ref 6.5–8.1)

## 2021-05-04 LAB — CBC WITH DIFFERENTIAL/PLATELET
Abs Immature Granulocytes: 0.03 10*3/uL (ref 0.00–0.07)
Basophils Absolute: 0 10*3/uL (ref 0.0–0.1)
Basophils Relative: 0 %
Eosinophils Absolute: 0 10*3/uL (ref 0.0–0.5)
Eosinophils Relative: 0 %
HCT: 44.5 % (ref 36.0–46.0)
Hemoglobin: 15.4 g/dL — ABNORMAL HIGH (ref 12.0–15.0)
Immature Granulocytes: 0 %
Lymphocytes Relative: 3 %
Lymphs Abs: 0.4 10*3/uL — ABNORMAL LOW (ref 0.7–4.0)
MCH: 32.4 pg (ref 26.0–34.0)
MCHC: 34.6 g/dL (ref 30.0–36.0)
MCV: 93.5 fL (ref 80.0–100.0)
Monocytes Absolute: 1.2 10*3/uL — ABNORMAL HIGH (ref 0.1–1.0)
Monocytes Relative: 10 %
Neutro Abs: 10.3 10*3/uL — ABNORMAL HIGH (ref 1.7–7.7)
Neutrophils Relative %: 87 %
Platelets: 148 10*3/uL — ABNORMAL LOW (ref 150–400)
RBC: 4.76 MIL/uL (ref 3.87–5.11)
RDW: 12.6 % (ref 11.5–15.5)
WBC: 11.9 10*3/uL — ABNORMAL HIGH (ref 4.0–10.5)
nRBC: 0 % (ref 0.0–0.2)

## 2021-05-04 LAB — URINALYSIS, ROUTINE W REFLEX MICROSCOPIC
Glucose, UA: NEGATIVE mg/dL
Hgb urine dipstick: NEGATIVE
Ketones, ur: NEGATIVE mg/dL
Nitrite: POSITIVE — AB
Protein, ur: 100 mg/dL — AB
Specific Gravity, Urine: 1.02 (ref 1.005–1.030)
pH: 6.5 (ref 5.0–8.0)

## 2021-05-04 LAB — PREGNANCY, URINE: Preg Test, Ur: NEGATIVE

## 2021-05-04 LAB — URINALYSIS, MICROSCOPIC (REFLEX): WBC, UA: >50 WBC/hpf (ref 0–5)

## 2021-05-04 LAB — LIPASE, BLOOD: Lipase: 20 U/L (ref 11–51)

## 2021-05-04 LAB — LACTIC ACID, PLASMA: Lactic Acid, Venous: 1 mmol/L (ref 0.5–1.9)

## 2021-05-04 MED ORDER — SODIUM CHLORIDE 0.9 % IV BOLUS
1000.0000 mL | Freq: Once | INTRAVENOUS | Status: AC
  Administered 2021-05-04: 1000 mL via INTRAVENOUS

## 2021-05-04 MED ORDER — CEPHALEXIN 500 MG PO CAPS
500.0000 mg | ORAL_CAPSULE | Freq: Three times a day (TID) | ORAL | 0 refills | Status: DC
  Filled 2021-05-04: qty 42, 14d supply, fill #0

## 2021-05-04 MED ORDER — ONDANSETRON HCL 4 MG/2ML IJ SOLN
4.0000 mg | Freq: Once | INTRAMUSCULAR | Status: AC
  Administered 2021-05-04: 4 mg via INTRAVENOUS
  Filled 2021-05-04: qty 2

## 2021-05-04 MED ORDER — IOHEXOL 300 MG/ML  SOLN
100.0000 mL | Freq: Once | INTRAMUSCULAR | Status: AC | PRN
  Administered 2021-05-04: 100 mL via INTRAVENOUS

## 2021-05-04 MED ORDER — ACETAMINOPHEN 325 MG PO TABS
650.0000 mg | ORAL_TABLET | Freq: Once | ORAL | Status: AC
  Administered 2021-05-04: 650 mg via ORAL
  Filled 2021-05-04: qty 2

## 2021-05-04 MED ORDER — SODIUM CHLORIDE 0.9 % IV SOLN
1.0000 g | Freq: Once | INTRAVENOUS | Status: AC
  Administered 2021-05-04: 1 g via INTRAVENOUS
  Filled 2021-05-04: qty 10

## 2021-05-04 MED ORDER — HYDROMORPHONE HCL 1 MG/ML IJ SOLN: 1.0000 mg | Freq: Once | INTRAMUSCULAR | Status: DC

## 2021-05-04 MED ORDER — KETOROLAC TROMETHAMINE 15 MG/ML IJ SOLN
15.0000 mg | Freq: Once | INTRAMUSCULAR | Status: AC
  Administered 2021-05-04: 15 mg via INTRAVENOUS
  Filled 2021-05-04: qty 1

## 2021-05-04 NOTE — ED Notes (Signed)
Pt ambulated to RR with steady gait to collect urine sample. NAD.

## 2021-05-04 NOTE — Discharge Instructions (Addendum)
Please take Tylenol for pain and fever.  Please take antibiotics 3 times daily.  Please drink plenty of fluids.  Please return to the emergency department for worsening symptoms like we discussed.

## 2021-05-04 NOTE — ED Triage Notes (Signed)
Pt arrives with c/o pain to left side states that she feels like she may have a UTI or bladder infection. Reports history of same. Also reports some fevers and discomfort with urination.

## 2021-05-04 NOTE — ED Provider Notes (Signed)
[facility] EMERGENCY DEPARTMENT Provider Note   CSN: [id number] Arrival date & time: 05/04/21  1411     History Chief Complaint  Patient presents with   Flank Pain    Patricia Mcmahon is a 36 y.o. female with history of gastrectomy, intussusception, and kidney infections who presents to the emergency department today with a 4-day history of dysuria, dark colored urine, and urinary frequency. She does have a history of UTIs and states this feels similar.  She also reports associated left-sided flank pain and diffuse abdominal pain.  Patient also reports associated subjective fever, chills, nausea. She denies vomiting, diarrhea, chest pain, shortness of breath. Tylenol improves the fever.    Flank Pain      Home Medications Prior to Admission medications   Medication Sig Start Date End Date Taking? Authorizing Provider  cephALEXin (KEFLEX) 500 MG capsule Take 1 capsule (500 mg total) by mouth 3 (three) times daily. 05/04/21  Yes Raul Del, Marka Treloar M, PA-C  acetaminophen (TYLENOL) 325 MG tablet Take 650 mg by mouth every 6 (six) hours as needed (pain).    [provider]  ibuprofen (ADVIL) 200 MG tablet Take 800 mg by mouth 3 (three) times daily as needed (pain).    [provider]  levonorgestrel (MIRENA) 20 MCG/24HR IUD 1 each by Intrauterine route once. Implanted 1st part of the year 2020    [provider]      Allergies    Morphine and related    Review of Systems   Review of Systems  Genitourinary:  Positive for flank pain.  All other systems reviewed and are negative.  Physical Exam Updated Vital Signs BP 97/65 (BP Location: Right Arm) Comment: sleeping on left side   Pulse 94    Temp 99.9 F (37.7 C) (Oral)    Resp 16    Ht 5\' 9"  (1.753 m)    Wt 70.3 kg    SpO2 98%    BMI 22.89 kg/m  Physical Exam Vitals and nursing note reviewed.  Constitutional:      General: She is not in acute distress.    Appearance: Normal appearance.  HENT:      Head: Normocephalic and atraumatic.  Eyes:     General:        Right eye: No discharge.        Left eye: No discharge.  Cardiovascular:     Comments: Regular rate and rhythm.  S1/S2 are distinct without any evidence of murmur, rubs, or gallops.  Radial pulses are 2+ bilaterally.  Dorsalis pedis pulses are 2+ bilaterally.  No evidence of pedal edema. Pulmonary:     Comments: Clear to auscultation bilaterally.  Normal effort.  No respiratory distress.  No evidence of wheezes, rales, or rhonchi heard throughout. Abdominal:     General: Abdomen is flat. Bowel sounds are normal. There is no distension.     Tenderness: There is generalized abdominal tenderness. There is left CVA tenderness. There is no guarding or rebound.  Musculoskeletal:        General: Normal range of motion.     Cervical back: Neck supple.  Skin:    General: Skin is warm and dry.     Findings: No rash.  Neurological:     General: No focal deficit present.     Mental Status: She is alert.  Psychiatric:        Mood and Affect: Mood normal.        Behavior: Behavior normal.  ED Results / Procedures / Treatments   Labs (all labs ordered are listed, but only abnormal results are displayed) Labs Reviewed  COMPREHENSIVE METABOLIC PANEL - Abnormal; Notable for the following components:      Result Value   Sodium 131 (*)    Glucose, Bld 119 (*)    All other components within normal limits  CBC WITH DIFFERENTIAL/PLATELET - Abnormal; Notable for the following components:   WBC 11.9 (*)    Hemoglobin 15.4 (*)    Platelets 148 (*)    Neutro Abs 10.3 (*)    Lymphs Abs 0.4 (*)    Monocytes Absolute 1.2 (*)    All other components within normal limits  URINALYSIS, ROUTINE W REFLEX MICROSCOPIC - Abnormal; Notable for the following components:   APPearance CLOUDY (*)    Bilirubin Urine SMALL (*)    Protein, ur 100 (*)    Nitrite POSITIVE (*)    Leukocytes,Ua SMALL (*)    All other components within normal limits   URINALYSIS, MICROSCOPIC (REFLEX) - Abnormal; Notable for the following components:   Bacteria, UA MANY (*)    All other components within normal limits  URINE CULTURE  CULTURE, BLOOD (ROUTINE X 2)  CULTURE, BLOOD (ROUTINE X 2)  LIPASE, BLOOD  PREGNANCY, URINE  LACTIC ACID, PLASMA    EKG None  Radiology CT ABDOMEN PELVIS W CONTRAST  Result Date: 05/04/2021 CLINICAL DATA:  36 y/o female. c/o pain to left side states that she feels like she may have a UTI or bladder infection. Reports history of same. Also reports some fevers and discomfort with urination. Also has h/o SBO. 100 cc Omnipaque 300^116mL OMNIPAQUE IOHEXOL 300 MG/ML SOLNAbdominal pain, acute, nonlocalized left flank pain and diffuse abdominal pain EXAM: CT ABDOMEN AND PELVIS WITH CONTRAST TECHNIQUE: Multidetector CT imaging of the abdomen and pelvis was performed using the standard protocol following bolus administration of intravenous contrast. RADIATION DOSE REDUCTION: This exam was performed according to the departmental dose-optimization program which includes automated exposure control, adjustment of the mA and/or kV according to patient size and/or use of iterative reconstruction technique. CONTRAST:  147mL OMNIPAQUE IOHEXOL 300 MG/ML  SOLN COMPARISON:  CT 01/11/2021 FINDINGS: Lower chest: Lung bases are clear. Hepatobiliary: No focal hepatic lesion. Gallstones within nondistended gallbladder. No biliary duct dilatation. Common bile duct is normal. Pancreas: Pancreas is normal. No ductal dilatation. No pancreatic inflammation. Spleen: Normal spleen Adrenals/urinary tract: Adrenal glands normal. Several foci of hypoperfusion within the LEFT kidney involving the upper pole and lower pole. Findings are most suggestive of multifocal pyelonephritis. LEFT ureter and bladder are normal. RIGHT kidney normal. Stomach/Bowel: Post gastric bypass anatomy. Gastric sleeve anatomy. Duodenum appears normal. There is a proximal small bowel - small  bowel intussusception within the RIGHT upper quadrant with the intussusception measures approximately 7 cm (image 33/6). Intussusception on comparison exam from 01/10/2021 within the LEFT upper quadrant with similar length. No small bowel obstruction. Distal small bowel normal. Terminal ileum normal The colon and rectosigmoid colon are normal. Vascular/Lymphatic: Abdominal aorta is normal caliber. No periportal or retroperitoneal adenopathy. No pelvic adenopathy. Reproductive: IUD expected location the uterus. No adnexal abnormality. Other: Trace amount of free fluid the pelvis. Musculoskeletal: No aggressive osseous lesion. IMPRESSION: 1. Multiple hypoenhancing foci within the LEFT renal cortex is most consistent with MULTIFOCAL PYELONEPHRITIS. 2. Proximal small bowel intussusception in the central and RIGHT upper quadrant. This is recurrent from a small bowel intussusception in the LEFT upper quadrant on CT 01/10/2021 which resolved on 1  day CT follow-up. 3. Post bariatric gastric sleeve anatomy Electronically Signed   By: Suzy Bouchard M.D.   On: 05/04/2021 18:13    Procedures Procedures   Medications Ordered in ED Medications  ketorolac (TORADOL) 15 MG/ML injection 15 mg (has no administration in time range)  sodium chloride 0.9 % bolus 1,000 mL ( Intravenous Stopped 05/04/21 1656)  ondansetron (ZOFRAN) injection 4 mg (4 mg Intravenous Given 05/04/21 1554)  acetaminophen (TYLENOL) tablet 650 mg (650 mg Oral Given 05/04/21 1711)  cefTRIAXone (ROCEPHIN) 1 g in sodium chloride 0.9 % 100 mL IVPB (1 g Intravenous New Bag/Given 05/04/21 1712)  iohexol (OMNIPAQUE) 300 MG/ML solution 100 mL (100 mLs Intravenous Contrast Given 05/04/21 1743)    ED Course/ Medical Decision Making/ A&P                           Medical Decision Making Amount and/or Complexity of Data Reviewed Labs: ordered. Radiology: ordered.  Risk OTC drugs. Prescription drug management.   RENDA BERNSTEIN is a 36 y.o. female with  significant morbidities affecting her care including gastric bypass and intussusception who presents to the emergency department today for further evaluation of urinary tract infection symptoms.  Old records were reviewed.  Differential diagnosis includes pyelonephritis versus cystitis.  There is also concern for intra-abdominal pathology.  Also considering kidney stone.  I will get some basic labs, urine, and scan of her belly given her complicated abdominal history.  We will also give her Zofran for nausea, and a liter of fluids as she does appear dry by physical exam.  We will plan to reassess.  CBC did reveal leukocytosis.  CMP was without any signs of AKI.  Mild hyponatremia.  Pregnancy negative.  Initial lactic was negative.  UA was positive for infection.  Lipase is normal.  I personally reviewed the CT images which did show heterogenicity of the left kidney consistent with pyelonephritis given the symptoms.  I do agree with the radiologist or potation.  Critical interventions include Tylenol for fever, fluids, and ketorolac for pain.  I discussed the results and findings at the bedside with the patient.  She expressed full understanding.  Patient wishes to go home with antibiotics.  I feel this is an amenable plan at this time. Vitals have been otherwise normal apart from a brief fever.   We discussed at length with strict return precautions.  Given the clinical scenario, I do believe she is safe for outpatient follow-up.  Social determinants of health include tobacco use. I will plan to discharge the patient with Keflex TID.  Final Clinical Impression(s) / ED Diagnoses Final diagnoses:  Pyelonephritis    Rx / DC Orders ED Discharge Orders          Ordered    cephALEXin (KEFLEX) 500 MG capsule  3 times daily        05/04/21 1912              Hendricks Limes, PA-C 05/04/21 1916    Patricia Freeze, MD 05/04/21 2104

## 2021-05-04 NOTE — ED Notes (Signed)
Pt offered to collect urine sample, unable to void at this time.

## 2021-05-04 NOTE — ED Notes (Signed)
Patient transported to CT 

## 2021-05-05 ENCOUNTER — Other Ambulatory Visit (HOSPITAL_BASED_OUTPATIENT_CLINIC_OR_DEPARTMENT_OTHER): Payer: Self-pay

## 2021-05-07 LAB — URINE CULTURE: Culture: >=100000 — AB

## 2021-05-08 ENCOUNTER — Telehealth (HOSPITAL_BASED_OUTPATIENT_CLINIC_OR_DEPARTMENT_OTHER): Payer: Self-pay | Admitting: *Deleted

## 2021-05-08 NOTE — Telephone Encounter (Signed)
Post ED Visit - Positive Culture Follow-up  Culture report reviewed by antimicrobial stewardship pharmacist: [facility] Team []  Elenor Quinones, Pharm.D. []  Heide Guile, Pharm.D., BCPS AQ-ID []  Parks Neptune, Pharm.D., BCPS []  Alycia Rossetti, Pharm.D., BCPS []  [place], [place].D., BCPS, AAHIVP []  Legrand Como, Pharm.D., BCPS, AAHIVP []  Salome Arnt, PharmD, BCPS []  Johnnette Gourd, PharmD, BCPS []  Hughes Better, PharmD, BCPS []  Leeroy Cha, PharmD []  Laqueta Linden, PharmD, BCPS [x]  Myra Rude, PharmD  [facility] Team []  Leodis Sias, PharmD []  Lindell Spar, PharmD []  Royetta Asal, PharmD []  Graylin Shiver, Rph []  Rema Fendt) Glennon Mac, PharmD []  Arlyn Dunning, PharmD []  Netta Cedars, PharmD []  Dia Sitter, PharmD []  Leone Haven, PharmD []  Gretta Arab, PharmD []  Theodis Shove, PharmD []  Peggyann Juba, PharmD []  Reuel Boom, PharmD   Positive urine culture Treated with Cephalexin, organism sensitive to the same and no further patient follow-up is required at this time.  Rosie Fate 05/08/2021, 1:33 PM

## 2021-05-09 LAB — CULTURE, BLOOD (ROUTINE X 2)
Culture: NO GROWTH
Culture: NO GROWTH
Special Requests: ADEQUATE
Special Requests: ADEQUATE

## 2021-05-30 ENCOUNTER — Encounter: Payer: Self-pay | Admitting: Family Medicine

## 2021-05-30 ENCOUNTER — Ambulatory Visit: Payer: Medicaid Other | Admitting: Family Medicine

## 2021-05-30 ENCOUNTER — Other Ambulatory Visit (HOSPITAL_COMMUNITY)
Admission: RE | Admit: 2021-05-30 | Discharge: 2021-05-30 | Disposition: A | Discharge status: Home / Self Care | Payer: Medicaid Other | Source: Ambulatory Visit | Attending: Family Medicine | Admitting: Family Medicine

## 2021-05-30 VITALS — BP 120/69 | HR 83 | Ht 69.0 in | Wt 156.0 lb

## 2021-05-30 DIAGNOSIS — N898 Other specified noninflammatory disorders of vagina: Secondary | ICD-10-CM

## 2021-05-30 DIAGNOSIS — R300 Dysuria: Secondary | ICD-10-CM

## 2021-05-30 DIAGNOSIS — F319 Bipolar disorder, unspecified: Secondary | ICD-10-CM | POA: Diagnosis not present

## 2021-05-30 DIAGNOSIS — Z114 Encounter for screening for human immunodeficiency virus [HIV]: Secondary | ICD-10-CM

## 2021-05-30 DIAGNOSIS — F419 Anxiety disorder, unspecified: Secondary | ICD-10-CM | POA: Diagnosis not present

## 2021-05-30 DIAGNOSIS — N3000 Acute cystitis without hematuria: Secondary | ICD-10-CM

## 2021-05-30 DIAGNOSIS — Z113 Encounter for screening for infections with a predominantly sexual mode of transmission: Secondary | ICD-10-CM

## 2021-05-30 DIAGNOSIS — B3731 Acute candidiasis of vulva and vagina: Secondary | ICD-10-CM

## 2021-05-30 DIAGNOSIS — Z1159 Encounter for screening for other viral diseases: Secondary | ICD-10-CM

## 2021-05-30 DIAGNOSIS — B9689 Other specified bacterial agents as the cause of diseases classified elsewhere: Secondary | ICD-10-CM

## 2021-05-30 DIAGNOSIS — N760 Acute vaginitis: Secondary | ICD-10-CM

## 2021-05-30 LAB — POC URINALSYSI DIPSTICK (AUTOMATED)
Bilirubin, UA: NEGATIVE
Blood, UA: NEGATIVE
Glucose, UA: NEGATIVE
Ketones, UA: NEGATIVE
Nitrite, UA: NEGATIVE
Protein, UA: POSITIVE — AB
Spec Grav, UA: 1.01 (ref 1.010–1.025)
Urobilinogen, UA: 2 E.U./dL — AB
pH, UA: 7.5 (ref 5.0–8.0)

## 2021-05-30 NOTE — Progress Notes (Signed)
______________________________________________________________________  HPI Patricia Mcmahon is a 36 y.o. female presenting to [facility] at [facility] today to establish care.   Patient Care Team: Terrilyn Saver, NP as PCP - General (Family Medicine) Nicola Girt, DO as PCP - Internal Medicine  Health Maintenance  Topic Date Due   Hepatitis C Screening: USPSTF Recommendation to screen - Ages 43-79 yo.  Never done   Pap Smear  Never done   COVID-19 Vaccine (1) 06/15/2021*   Tetanus Vaccine  03/31/2028   HIV Screening  Completed   HPV Vaccine  Aged Out   Flu Shot  Discontinued  *Topic was postponed. The date shown is not the original due date.     Concerns today: Urinary/Vaginal symptoms: Patient has had over a week of vaginal burning, itching, thick white discharge, dysuria, urinary urgency/frequency/hesitancy. She denies hematuria, fevers, abdominal/back pain, nausea, vomiting, diarrhea, vaginal rash/lesions. She is sexually active with one female partner and does not use protection. She would like to add STD screening today.  Reports history of Bipolar I and anxiety. States she was diagnosed as a teenager. She is not currently on medication, but would like psych referral to restart. No acute problems today. Denies suicidal/homicidal ideation.     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 GAD7 Today: No flowsheet data found. ______________________________________________________________________ PMH Past Medical History:  Diagnosis Date   GERD (gastroesophageal reflux disease)    no current med.   Left knee pain 12/16/2012    ROS All review of systems negative except what is listed in the HPI  PHYSICAL EXAM Physical Exam Vitals reviewed.  Constitutional:      Appearance: Normal appearance. She is normal weight.  HENT:     Head: Normocephalic and  atraumatic.  Cardiovascular:     Rate and Rhythm: Normal rate and regular rhythm.  Pulmonary:     Effort: Pulmonary effort is normal.     Breath sounds: Normal breath sounds.  Abdominal:     General: Abdomen is flat. Bowel sounds are normal. There is no distension.     Palpations: Abdomen is soft. There is no mass.     Tenderness: There is no abdominal tenderness. There is no right CVA tenderness, left CVA tenderness, guarding or rebound.     Hernia: No hernia is present.  Skin:    General: Skin is warm and dry.  Neurological:     General: No focal deficit present.     Mental Status: She is alert and oriented to person, place, and time. Mental status is at baseline.  Psychiatric:        Mood and Affect: Mood normal.        Behavior: Behavior normal.        Thought Content: Thought content normal.        Judgment: Judgment normal.   ______________________________________________________________________ ASSESSMENT AND PLAN  1. Dysuria UA with small leuks, protein. Will send for culture. Discussed adequate hydration and supportive measures. Will update with results and plan.  - POCT Urinalysis Dipstick (Automated) - Urine Culture  2. Vaginal itching 3. Vaginal discharge Sending swab for testing. Will update patient with results. Discussed safe sex.  - Cervicovaginal ancillary only( )  4. Screen for STD (sexually transmitted disease) - RPR - HIV Antibody (routine testing w rflx)  5. Encounter for screening for HIV - HIV Antibody (routine testing  w rflx)  6. Encounter for hepatitis C screening test for low risk patient - Hepatitis C antibody  7. Bipolar 1 disorder ([place]) 8. Anxiety No acute concerns today. Referral placed for medication and counseling. Aware of emergent resources.  - Ambulatory referral to [facility] care Education provided today during visit and on AVS for patient to review at home.  Diet and Exercise recommendations  provided.  Current diagnoses and recommendations discussed. HM recommendations reviewed with recommendations.  - Pap in 2020 was normal - will request records.     Outpatient Encounter Medications as of 05/30/2021  Medication Sig   acetaminophen (TYLENOL) 325 MG tablet Take 650 mg by mouth every 6 (six) hours as needed (pain).   ibuprofen (ADVIL) 200 MG tablet Take 800 mg by mouth 3 (three) times daily as needed (pain).   levonorgestrel (MIRENA) 20 MCG/24HR IUD 1 each by Intrauterine route once. Implanted 1st part of the year 2020   [DISCONTINUED] cephALEXin (KEFLEX) 500 MG capsule Take 1 capsule (500 mg total) by mouth 3 (three) times daily.   No facility-administered encounter medications on file as of 05/30/2021.    Return if symptoms worsen or fail to improve, for schedule physical within the next year.    Purcell Nails Olevia Bowens, DNP, FNP-C

## 2021-05-30 NOTE — Patient Instructions (Signed)
Thank you for choosing  Primary Care at [facility] for your Primary Care needs. I am excited for the opportunity to partner with you to meet your health care goals. It was a pleasure meeting you today!  Recommendations from today's visit: Please return record request form for [place] to get your most recent pap smear results   Information on diet, exercise, and health maintenance recommendations are listed below. This is information to help you be sure you are on track for optimal health and monitoring.   Please look over this and let us know if you have any questions or if you have completed any of the health maintenance outside of [facility] so that we can be sure your records are up to date.  ___________________________________________________________  MyChart:  For all urgent or time sensitive needs we ask that you please call the office to avoid delays. Our number is [phone]. MyChart is not constantly monitored and due to the large volume of messages a day, replies may take up to 72 business hours.  MyChart Policy: MyChart allows for you to see your visit notes, after visit summary, provider recommendations, lab and tests results, make an appointment, request refills, and contact your provider or the office for non-urgent questions or concerns. Providers are seeing patients during normal business hours and do not have built in time to review MyChart messages.  We ask that you allow a minimum of 3 business days for responses to [employer]. For this reason, please do not send urgent requests through MyChart. Please call the office at [phone]. New and ongoing conditions may require a visit. We have virtual and in-person visits available for your convenience.  Complex MyChart concerns may require a visit. Your provider may request you schedule a virtual or in-person visit to ensure we are providing the best care possible. MyChart messages sent after 11:00 AM on  Friday will not be received by the provider until Monday morning.    Lab and Test Results: You will receive your lab and test results on MyChart as soon as they are completed and results have been sent by the lab or testing facility. Due to this service, you will receive your results BEFORE your provider.  I review lab and test results each morning prior to seeing patients. Some results require collaboration with other providers to ensure you are receiving the most appropriate care. For this reason, we ask that you please allow a minimum of 3-5 business days from the time that ALL results have been received for your provider to receive and review lab and test results and contact you about these.  Most lab and test result comments from the provider will be sent through MyChart. Your provider may recommend changes to the plan of care, follow-up visits, repeat testing, ask questions, or request an office visit to discuss these results. You may reply directly to this message or call the office to provide information for the provider or set up an appointment. In some instances, you will be called with test results and recommendations. Please let us know if this is preferred and we will make note of this in your chart to provide this for you.    If you have not heard a response to your lab or test results in 5 business days from all results returning to MyChart, please call the office to let us know. We ask that you please avoid calling prior to this time unless there is an emergent concern.  Due to high call volumes, this can delay the resulting process.  After Hours: For all non-emergency after hours needs, please call the office at [phone] and select the option to reach the on-call  service. On-call services are shared between multiple [facility] offices and therefore it will not be possible to speak directly with your provider. On-call providers may provide medical advice and recommendations, but are  unable to provide refills for maintenance medications.  For all emergency or urgent medical needs after normal business hours, we recommend that you seek care at the closest Urgent Care or Emergency Department to ensure appropriate treatment in a timely manner.  [facility] has a 24 hour emergency room located on the ground floor for your convenience.   Urgent Concerns During the Business Day Providers are seeing patients from 8AM to 5PM with a busy schedule and are most often not able to respond to non-urgent calls until the end of the day or the next business day. If you should have URGENT concerns during the day, please call and speak to the nurse or schedule a same day appointment so that we can address your concern without delay.   Thank you, again, for choosing me as your health care partner. I appreciate your trust and look forward to learning more about you.   Lollie Marrow Reola Calkins, DNP, FNP-C  ___________________________________________________________  Health Maintenance Recommendations Screening Testing Mammogram Every 1-2 years based on history and risk factors Starting at age 63 Pap Smear Ages 21-39 every 3 years Ages 76-65 every 5 years with HPV testing More frequent testing may be required based on results and history Colon Cancer Screening Every 1-10 years based on test performed, risk factors, and history Starting at age 26 Bone Density Screening Every 2-10 years based on history Starting at age 28 for women Recommendations for men differ based on medication usage, history, and risk factors AAA Screening One time ultrasound Men 27-50 years old who have ever smoked Lung Cancer Screening Low Dose Lung CT every 12 months Age 63-80 years with a 20 pack-year smoking history who still smoke or who have quit within the last 15 years  Screening Labs Routine  Labs: Complete Blood Count (CBC), Complete Metabolic Panel (CMP), Cholesterol (Lipid  Panel) Every 6-12 months based on history and medications May be recommended more frequently based on current conditions or previous results Hemoglobin A1c Lab Every 3-12 months based on history and previous results Starting at age 33 or earlier with diagnosis of diabetes, high cholesterol, BMI >26, and/or risk factors Frequent monitoring for patients with diabetes to ensure blood sugar control Thyroid Panel (TSH w/ T3 & T4) Every 6 months based on history, symptoms, and risk factors May be repeated more often if on medication HIV One time testing for all patients 18 and older May be repeated more frequently for patients with increased risk factors or exposure Hepatitis C One time testing for all patients 55 and older May be repeated more frequently for patients with increased risk factors or exposure Gonorrhea, Chlamydia Every 12 months for all sexually active persons 13-24 years Additional monitoring may be recommended for those who are considered high risk or who have symptoms PSA Men 81-68 years old with risk factors Additional screening may be recommended from age 55-69 based on risk factors, symptoms, and history  Vaccine Recommendations Tetanus Booster All adults every 10 years Flu Vaccine All patients 6 months and older every year COVID Vaccine All patients 12 years and older  Initial dosing with booster May recommend additional booster based on age and health history HPV Vaccine 2 doses all patients age 36-26 Dosing may be considered for patients over 26 Shingles Vaccine (Shingrix) 2 doses all adults 50 years and older Pneumonia (Pneumovax 23) All adults 65 years and older May recommend earlier dosing based on health history Pneumonia (Prevnar 26) All adults 65 years and older Dosed 1 year after Pneumovax 23 Pneumonia (Prevnar 20) All adults 65 years and older (adults 19-64 with certain conditions or risk factors) 1 dose  For those who have no received Prevnar 13  vaccine previously   Additional Screening, Testing, and Vaccinations may be recommended on an individualized basis based on family history, health history, risk factors, and/or exposure.  __________________________________________________________  Diet Recommendations for All Patients  I recommend that all patients maintain a diet low in saturated fats, carbohydrates, and cholesterol. While this can be challenging at first, it is not impossible and small changes can make big differences.  Things to try: Decreasing the amount of soda, sweet tea, and/or juice to one or less per day and replace with water While water is always the first choice, if you do not like water you may consider adding a water additive without sugar to improve the taste other sugar free drinks Replace potatoes with a brightly colored vegetable at dinner Use healthy oils, such as canola oil or olive oil, instead of butter or hard margarine Limit your bread intake to two pieces or less a day Replace regular pasta with low carb pasta options Bake, broil, or grill foods instead of frying Monitor portion sizes  Eat smaller, more frequent meals throughout the day instead of large meals  An important thing to remember is, if you love foods that are not great for your health, you don't have to give them up completely. Instead, allow these foods to be a reward when you have done well. Allowing yourself to still have special treats every once in a while is a nice way to tell yourself thank you for working hard to keep yourself healthy.   Also remember that every day is a new day. If you have a bad day and "fall off the wagon", you can still climb right back up and keep moving along on your journey!  We have resources available to help you!  Some websites that may be helpful include: www.http://www.wall-moore.info/  Www.VeryWellFit.com _____________________________________________________________  Activity Recommendations for All  Patients  I recommend that all adults get at least 20 minutes of moderate physical activity that elevates your heart rate at least 5 days out of the week.  Some examples include: Walking or jogging at a pace that allows you to carry on a conversation Cycling (stationary bike or outdoors) Water aerobics Yoga Weight lifting Dancing If physical limitations prevent you from putting stress on your joints, exercise in a pool or seated in a chair are excellent options.  Do determine your MAXIMUM heart rate for activity: YOUR AGE - 220 = MAX HeartRate   Remember! Do not push yourself too hard.  Start slowly and build up your pace, speed, weight, time in exercise, etc.  Allow your body to rest between exercise and get good sleep. You will need more water than normal when you are exerting yourself. Do not wait until you are thirsty to drink. Drink with a purpose of getting in at least 8, 8 ounce glasses of water a day plus more depending on how much you exercise and sweat.  If you begin to develop dizziness, chest pain, abdominal pain, jaw pain, shortness of breath, headache, vision changes, lightheadedness, or other concerning symptoms, stop the activity and allow your body to rest. If your symptoms are severe, seek emergency evaluation immediately. If your symptoms are concerning, but not severe, please let us know so that we can recommend further evaluation.

## 2021-05-30 NOTE — Addendum Note (Signed)
Addended by: Hyman Hopes B on: 05/30/2021 05:24 PM   Modules accepted: Orders

## 2021-06-01 LAB — CERVICOVAGINAL ANCILLARY ONLY
Bacterial Vaginitis (gardnerella): POSITIVE — AB
Candida Glabrata: NEGATIVE
Candida Vaginitis: POSITIVE — AB
Chlamydia: NEGATIVE
Comment: NEGATIVE
Comment: NEGATIVE
Comment: NEGATIVE
Comment: NEGATIVE
Comment: NEGATIVE
Comment: NORMAL
Neisseria Gonorrhea: NEGATIVE
Trichomonas: NEGATIVE

## 2021-06-01 LAB — HEPATITIS C ANTIBODY
Hepatitis C Ab: NONREACTIVE
SIGNAL TO CUT-OFF: 0.07 (ref <1.00)

## 2021-06-01 LAB — HIV ANTIBODY (ROUTINE TESTING W REFLEX): HIV 1&2 Ab, 4th Generation: NONREACTIVE

## 2021-06-01 LAB — RPR: RPR Ser Ql: NONREACTIVE

## 2021-06-01 LAB — URINE CULTURE
MICRO NUMBER:: 13000627
SPECIMEN QUALITY:: ADEQUATE

## 2021-06-02 ENCOUNTER — Other Ambulatory Visit (HOSPITAL_BASED_OUTPATIENT_CLINIC_OR_DEPARTMENT_OTHER): Payer: Self-pay

## 2021-06-02 MED ORDER — FLUCONAZOLE 150 MG PO TABS
150.0000 mg | ORAL_TABLET | Freq: Once | ORAL | 1 refills | Status: AC
Start: 2021-06-02 — End: 2021-06-04
  Filled 2021-06-02: qty 1, 1d supply, fill #0

## 2021-06-02 MED ORDER — METRONIDAZOLE 500 MG PO TABS
500.0000 mg | ORAL_TABLET | Freq: Two times a day (BID) | ORAL | 0 refills | Status: AC
  Filled 2021-06-02: qty 14, 7d supply, fill #0

## 2021-06-02 MED ORDER — NITROFURANTOIN MONOHYD MACRO 100 MG PO CAPS
100.0000 mg | ORAL_CAPSULE | Freq: Two times a day (BID) | ORAL | 0 refills | Status: AC
  Filled 2021-06-02: qty 10, 5d supply, fill #0

## 2021-06-02 NOTE — Addendum Note (Signed)
Addended by: Hyman Hopes B on: 06/02/2021 02:52 PM   Modules accepted: Orders

## 2021-07-11 ENCOUNTER — Emergency Department (HOSPITAL_BASED_OUTPATIENT_CLINIC_OR_DEPARTMENT_OTHER)
Admission: EM | Admit: 2021-07-11 | Discharge: 2021-07-11 | Disposition: A | Discharge status: Home / Self Care | Payer: Medicaid Other | Attending: Emergency Medicine | Admitting: Emergency Medicine

## 2021-07-11 ENCOUNTER — Other Ambulatory Visit: Payer: Self-pay

## 2021-07-11 ENCOUNTER — Encounter (HOSPITAL_BASED_OUTPATIENT_CLINIC_OR_DEPARTMENT_OTHER): Payer: Self-pay | Admitting: Emergency Medicine

## 2021-07-11 ENCOUNTER — Other Ambulatory Visit (HOSPITAL_BASED_OUTPATIENT_CLINIC_OR_DEPARTMENT_OTHER): Payer: Self-pay

## 2021-07-11 DIAGNOSIS — R1011 Right upper quadrant pain: Secondary | ICD-10-CM | POA: Diagnosis not present

## 2021-07-11 DIAGNOSIS — R1031 Right lower quadrant pain: Secondary | ICD-10-CM | POA: Diagnosis present

## 2021-07-11 DIAGNOSIS — R109 Unspecified abdominal pain: Secondary | ICD-10-CM

## 2021-07-11 DIAGNOSIS — N390 Urinary tract infection, site not specified: Secondary | ICD-10-CM | POA: Insufficient documentation

## 2021-07-11 LAB — CBC
HCT: 42 % (ref 36.0–46.0)
Hemoglobin: 14.4 g/dL (ref 12.0–15.0)
MCH: 32.6 pg (ref 26.0–34.0)
MCHC: 34.3 g/dL (ref 30.0–36.0)
MCV: 95 fL (ref 80.0–100.0)
Platelets: 150 10*3/uL (ref 150–400)
RBC: 4.42 MIL/uL (ref 3.87–5.11)
RDW: 12.7 % (ref 11.5–15.5)
WBC: 7.3 10*3/uL (ref 4.0–10.5)
nRBC: 0 % (ref 0.0–0.2)

## 2021-07-11 LAB — URINALYSIS, ROUTINE W REFLEX MICROSCOPIC
Bilirubin Urine: NEGATIVE
Glucose, UA: NEGATIVE mg/dL
Ketones, ur: NEGATIVE mg/dL
Nitrite: NEGATIVE
Protein, ur: 100 mg/dL — AB
Specific Gravity, Urine: 1.025 (ref 1.005–1.030)
pH: 6 (ref 5.0–8.0)

## 2021-07-11 LAB — BASIC METABOLIC PANEL
Anion gap: 6 (ref 5–15)
BUN: 11 mg/dL (ref 6–20)
CO2: 27 mmol/L (ref 22–32)
Calcium: 10.4 mg/dL — ABNORMAL HIGH (ref 8.9–10.3)
Chloride: 102 mmol/L (ref 98–111)
Creatinine, Ser: 0.79 mg/dL (ref 0.44–1.00)
GFR, Estimated: >60 mL/min (ref >60)
Glucose, Bld: 99 mg/dL (ref 70–99)
Potassium: 4.3 mmol/L (ref 3.5–5.1)
Sodium: 135 mmol/L (ref 135–145)

## 2021-07-11 LAB — PREGNANCY, URINE: Preg Test, Ur: NEGATIVE

## 2021-07-11 LAB — URINALYSIS, MICROSCOPIC (REFLEX): WBC, UA: >50 WBC/hpf (ref 0–5)

## 2021-07-11 MED ORDER — ONDANSETRON HCL 4 MG PO TABS
4.0000 mg | ORAL_TABLET | Freq: Every day | ORAL | 1 refills | Status: DC | PRN
  Filled 2021-07-11: qty 20, 20d supply, fill #0

## 2021-07-11 MED ORDER — ONDANSETRON 4 MG PO TBDP
4.0000 mg | ORAL_TABLET | Freq: Once | ORAL | Status: AC
  Administered 2021-07-11: 4 mg via ORAL
  Filled 2021-07-11: qty 1

## 2021-07-11 MED ORDER — ACETAMINOPHEN 325 MG PO TABS
650.0000 mg | ORAL_TABLET | Freq: Once | ORAL | Status: AC
  Administered 2021-07-11: 650 mg via ORAL
  Filled 2021-07-11: qty 2

## 2021-07-11 MED ORDER — SODIUM CHLORIDE 0.9 % IV BOLUS
1000.0000 mL | Freq: Once | INTRAVENOUS | Status: AC
Start: 2021-07-11 — End: 2021-07-11
  Administered 2021-07-11: 1000 mL via INTRAVENOUS

## 2021-07-11 MED ORDER — CEPHALEXIN 500 MG PO CAPS
500.0000 mg | ORAL_CAPSULE | Freq: Four times a day (QID) | ORAL | 0 refills | Status: AC
  Filled 2021-07-11: qty 28, 7d supply, fill #0

## 2021-07-11 MED ORDER — SODIUM CHLORIDE 0.9 % IV SOLN
1.0000 g | Freq: Once | INTRAVENOUS | Status: AC
  Administered 2021-07-11: 1 g via INTRAVENOUS
  Filled 2021-07-11: qty 10

## 2021-07-11 NOTE — ED Triage Notes (Addendum)
Pt reports bilateral flank pain that started Saturday morning. Pt reports night sweats, body aches, and chills. Pt also reporting nausea and vomiting.  ?

## 2021-07-11 NOTE — Discharge Instructions (Signed)
Return if any problems.  See your Physicain for recheck in 2-3 days  

## 2021-07-11 NOTE — ED Provider Notes (Signed)
?[place] EMERGENCY DEPARTMENT ?Provider Note ? ? ?CSN: [id number] ?Arrival date & time: 07/11/21  1032 ? ?  ? ?History ? ?Chief Complaint  ?Patient presents with  ? Flank Pain  ? ? ?Patricia Mcmahon is a 36 y.o. female. ? ?Pt complains of pain in her both sides of her back.   ? ?The history is provided by the patient. No language interpreter was used.  ?Flank Pain ?This is a new problem. The problem occurs constantly. The problem has not changed since onset.Pertinent negatives include no abdominal pain. Nothing aggravates the symptoms. Nothing relieves the symptoms. She has tried nothing for the symptoms. The treatment provided no relief.  ? ?  ? ?Home Medications ?Prior to Admission medications   ?Medication Sig Start Date End Date Taking? Authorizing Provider  ?cephALEXin (KEFLEX) 500 MG capsule Take 1 capsule (500 mg total) by mouth 4 (four) times daily for 7 days. 07/11/21 07/18/21 Yes Fransico Meadow, PA-C  ?ondansetron (ZOFRAN) 4 MG tablet Take 1 tablet (4 mg total) by mouth daily as needed for nausea or vomiting. 07/11/21 07/11/22 Yes Fransico Meadow, PA-C  ?acetaminophen (TYLENOL) 325 MG tablet Take 650 mg by mouth every 6 (six) hours as needed (pain).    [provider]  ?ibuprofen (ADVIL) 200 MG tablet Take 800 mg by mouth 3 (three) times daily as needed (pain).    [provider]  ?levonorgestrel (MIRENA) 20 MCG/24HR IUD 1 each by Intrauterine route once. Implanted 1st part of the year 2020    [provider]  ?   ? ?Allergies    ?Morphine and related   ? ?Review of Systems   ?Review of Systems  ?Gastrointestinal:  Negative for abdominal pain.  ?Genitourinary:  Positive for flank pain.  ?All other systems reviewed and are negative. ? ?Physical Exam ?Updated Vital Signs ?BP 110/74   Pulse 98   Temp 97.9 ?F (36.6 ?C) (Oral)   Resp 16   SpO2 100%  ?Physical Exam ?Vitals reviewed.  ?Constitutional:   ?   Appearance: Normal appearance.  ?Cardiovascular:  ?   Rate and Rhythm:  Normal rate and regular rhythm.  ?   Pulses: Normal pulses.  ?Pulmonary:  ?   Effort: Pulmonary effort is normal.  ?   Breath sounds: Normal breath sounds.  ?Abdominal:  ?   General: Abdomen is flat.  ?Musculoskeletal:     ?   General: Normal range of motion.  ?   Cervical back: Normal range of motion.  ?Skin: ?   General: Skin is warm.  ?Neurological:  ?   General: No focal deficit present.  ?   Mental Status: She is alert.  ?Psychiatric:     ?   Mood and Affect: Mood normal.  ? ? ?ED Results / Procedures / Treatments   ?Labs ?(all labs ordered are listed, but only abnormal results are displayed) ?Labs Reviewed  ?URINALYSIS, ROUTINE W REFLEX MICROSCOPIC - Abnormal; Notable for the following components:  ?    Result Value  ? Color, Urine AMBER (*)   ? APPearance HAZY (*)   ? Hgb urine dipstick TRACE (*)   ? Protein, ur 100 (*)   ? Leukocytes,Ua TRACE (*)   ? All other components within normal limits  ?BASIC METABOLIC PANEL - Abnormal; Notable for the following components:  ? Calcium 10.4 (*)   ? All other components within normal limits  ?URINALYSIS, MICROSCOPIC (REFLEX) - Abnormal; Notable for the following components:  ? Bacteria,  UA RARE (*)   ? Non Squamous Epithelial PRESENT (*)   ? All other components within normal limits  ?PREGNANCY, URINE  ?CBC  ? ? ?EKG ?None ? ?Radiology ?No results found. ? ?Procedures ?Procedures  ? ? ?Medications Ordered in ED ?Medications  ?sodium chloride 0.9 % bolus 1,000 mL (0 mLs Intravenous Stopped 07/11/21 1409)  ?cefTRIAXone (ROCEPHIN) 1 g in sodium chloride 0.9 % 100 mL IVPB (0 g Intravenous Stopped 07/11/21 1409)  ?ondansetron (ZOFRAN-ODT) disintegrating tablet 4 mg (4 mg Oral Given 07/11/21 1419)  ?acetaminophen (TYLENOL) tablet 650 mg (650 mg Oral Given 07/11/21 1419)  ? ? ?ED Course/ Medical Decision Making/ A&P ?  ?                        ?Medical Decision Making ?Pt reports she has had frequent uti's in the past  ? ?Problems Addressed: ?Urinary tract infection without  hematuria, site unspecified: acute illness or injury ? ?Amount and/or Complexity of Data Reviewed ?Labs: ordered. Decision-making details documented in ED Course. ?   Details: Ua shows greater than 50 wbc's ? ?Risk ?OTC drugs. ?Prescription drug management. ?Risk Details: Pt given Iv fluids and Rocephin 1 gram IV.  Pt given rx for keflex and zofran odt.  Pt advised to return if any problems.  ? ? ?MDM:  Ua  ? ? ? ? ? ? ? ?Final Clinical Impression(s) / ED Diagnoses ?Final diagnoses:  ?Urinary tract infection without hematuria, site unspecified  ?Right flank pain  ? ? ?Rx / DC Orders ?ED Discharge Orders   ? ?      Ordered  ?  cephALEXin (KEFLEX) 500 MG capsule  4 times daily       ? 07/11/21 1351  ?  ondansetron (ZOFRAN) 4 MG tablet  Daily PRN       ? 07/11/21 1412  ? ?  ?  ? ?  ? ?An After Visit Summary was printed and given to the patient.  ?  ?Fransico Meadow, PA-C ?07/11/21 1622 ? ?  ?Dorie Rank, MD ?07/11/21 2046 ? ?

## 2021-07-12 ENCOUNTER — Telehealth: Payer: Self-pay

## 2021-07-12 NOTE — Telephone Encounter (Signed)
Transition Care Management Follow-up Telephone Call ?Date of discharge and from where: 07/11/2021[facility] ?How have you been since you were released from the hospital? Pt stated she is doing fine.  ?Any questions or concerns? No ? ?Items Reviewed: ?Did the pt receive and understand the discharge instructions provided? Yes  ?Medications obtained and verified? Yes  ?Other? No  ?Any new allergies since your discharge? No  ?Dietary orders reviewed? No ?Do you have support at home? Yes  ? ?Home Care and Equipment/Supplies: ?Were home health services ordered? not applicable ?If so, what is the name of the agency? N/A  ?Has the agency set up a time to come to the patient's home? not applicable ?Were any new equipment or medical supplies ordered?  No ?What is the name of the medical supply agency? N/A ?Were you able to get the supplies/equipment? not applicable ?Do you have any questions related to the use of the equipment or supplies? No ? ?Functional Questionnaire: (I = Independent and D = Dependent) ?ADLs: I ? ?Bathing/Dressing- I ? ?Meal Prep- I ? ?Eating- I ? ?Maintaining continence- I ? ?Transferring/Ambulation- I ? ?Managing Meds- I ? ?Follow up appointments reviewed: ? ?PCP Hospital f/u appt confirmed? No   ?Specialist Hospital f/u appt confirmed? No   ?Are transportation arrangements needed? No  ?If their condition worsens, is the pt aware to call PCP or go to the Emergency Dept.? Yes ?Was the patient provided with contact information for the PCP's office or ED? Yes ?Was to pt encouraged to call back with questions or concerns? Yes  ?

## 2021-09-01 ENCOUNTER — Ambulatory Visit (HOSPITAL_COMMUNITY): Payer: Medicaid Other | Admitting: Licensed Clinical Social Worker

## 2021-09-06 ENCOUNTER — Ambulatory Visit (HOSPITAL_COMMUNITY): Payer: Medicaid Other | Admitting: Physician Assistant

## 2021-11-10 ENCOUNTER — Ambulatory Visit: Payer: Medicaid Other | Admitting: Family Medicine

## 2021-12-09 ENCOUNTER — Ambulatory Visit
Admission: EM | Admit: 2021-12-09 | Discharge: 2021-12-09 | Disposition: A | Discharge status: Home / Self Care | Payer: Medicaid Other | Attending: Emergency Medicine | Admitting: Emergency Medicine

## 2021-12-09 DIAGNOSIS — R300 Dysuria: Secondary | ICD-10-CM | POA: Diagnosis not present

## 2021-12-09 DIAGNOSIS — N3090 Cystitis, unspecified without hematuria: Secondary | ICD-10-CM | POA: Diagnosis not present

## 2021-12-09 LAB — POCT URINALYSIS DIP (MANUAL ENTRY)
Bilirubin, UA: NEGATIVE
Glucose, UA: NEGATIVE mg/dL
Ketones, POC UA: NEGATIVE mg/dL
Nitrite, UA: NEGATIVE
Protein Ur, POC: 100 mg/dL — AB
Spec Grav, UA: 1.02 (ref 1.010–1.025)
Urobilinogen, UA: 1 E.U./dL
pH, UA: 5.5 (ref 5.0–8.0)

## 2021-12-09 MED ORDER — SULFAMETHOXAZOLE-TRIMETHOPRIM 800-160 MG PO TABS: 1.0000 | ORAL_TABLET | Freq: Two times a day (BID) | ORAL | 0 refills | Status: DC

## 2021-12-09 MED ORDER — SULFAMETHOXAZOLE-TRIMETHOPRIM 800-160 MG PO TABS
1.0000 | ORAL_TABLET | Freq: Two times a day (BID) | ORAL | 0 refills | Status: DC
  Filled 2021-12-09: qty 10, 5d supply, fill #0

## 2021-12-09 NOTE — ED Triage Notes (Signed)
Pt c/o left lower back pain, chills, nausea, dizziness, fever, & urinary urgency.  Started: 3 days ago   Home interventions: tylenol

## 2021-12-09 NOTE — ED Provider Notes (Signed)
[facility]    CSN: [id number] Arrival date & time: 12/09/21  1617    HISTORY   Chief Complaint  Patient presents with   Urinary Urgency   Back Pain   HPI Patricia Mcmahon is a pleasant, 36 y.o. female who presents to urgent care today. Patient complains of increased frequency of urination.  Patient states she is also noticed that she has left lower back pain, nausea, dizziness and chills.  Patient states her symptoms began 3 days ago.  Patient states she has been taking Tylenol without meaningful relief of her symptoms.  Patient has absolutely normal vital signs on arrival today and appears otherwise well.    Past Medical History:  Diagnosis Date   GERD (gastroesophageal reflux disease)    no current med.   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   Past Surgical History:  Procedure Laterality Date   CESAREAN SECTION     ESOPHAGOGASTRODUODENOSCOPY N/A 01/14/2021   Procedure: ESOPHAGOGASTRODUODENOSCOPY (EGD);  Surgeon: Willis Modena, MD;  Location: [facility];  Service: Endoscopy;  Laterality: N/A;   GASTRECTOMY     sleeve   KNEE ARTHROSCOPY  04/02/2012   Procedure: ARTHROSCOPY KNEE;  Surgeon: Velna Ochs, MD;  Location: [facility];  Service: Orthopedics;  Laterality: Left;  left knee arthroscopy chondroplasty   KNEE ARTHROSCOPY WITH LATERAL RELEASE Left 01/07/2013   Procedure: LEFT KNEE ARTHROSCOPY, CHONDROPLASTY AND  LATERAL RELEASE;  Surgeon: Velna Ochs, MD;  Location: [facility];  Service: Orthopedics;  Laterality: Left;   OB History   No obstetric history on file.    Home Medications    Prior to Admission medications   Medication Sig Start Date End Date Taking? Authorizing Provider  acetaminophen (TYLENOL) 325 MG tablet Take 650 mg by mouth every 6 (six) hours as needed (pain).    [provider]  ibuprofen (ADVIL) 200  MG tablet Take 800 mg by mouth 3 (three) times daily as needed (pain).    [provider]  levonorgestrel (MIRENA) 20 MCG/24HR IUD 1 each by Intrauterine route once. Implanted 1st part of the year 2020    [provider]  ondansetron (ZOFRAN) 4 MG tablet Take 1 tablet (4 mg total) by mouth daily as needed for nausea or vomiting. 07/11/21 07/11/22  Elson Areas, PA-C    Family History Family History  Problem Relation Age of Onset   Alcohol abuse Mother    Cancer Mother    Learning disabilities Father    Alcohol abuse Father    Heart disease Maternal Grandmother    Diabetes Maternal Grandfather    Social History Social History   Tobacco Use   Smoking status: Every Day    Packs/day: 1.00    Years: 16.00    Total pack years: 16.00    Types: Cigarettes   Smokeless tobacco: Never  Vaping Use   Vaping Use: Never used  Substance Use Topics   Alcohol use: Yes    Comment: socially   Drug use: No   Allergies   Morphine and related  Review of Systems Review of Systems Pertinent findings revealed after performing a 14 point review of systems has been noted in the history of present illness.  Physical Exam Triage Vital Signs ED Triage Vitals  Enc Vitals Group     BP 02/11/21 0827 (!) 147/82     Pulse Rate 02/11/21 0827  72     Resp 02/11/21 0827 18     Temp 02/11/21 0827 98.3 F (36.8 C)     Temp Source 02/11/21 0827 Oral     SpO2 02/11/21 0827 98 %     Weight --      Height --      Head Circumference --      Peak Flow --      Pain Score 02/11/21 0826 5     Pain Loc --      Pain Edu? --      Excl. in GC? --   No data found.  Updated Vital Signs BP 106/71 (BP Location: Right Arm)   Pulse 85   Temp 98.3 F (36.8 C) (Oral)   Resp 18   SpO2 98%   Physical Exam Vitals and nursing note reviewed.  Constitutional:      General: She is not in acute distress.    Appearance: Normal appearance. She is not ill-appearing.  HENT:     Head: Normocephalic  and atraumatic.  Eyes:     General: Lids are normal.        Right eye: No discharge.        Left eye: No discharge.     Extraocular Movements: Extraocular movements intact.     Conjunctiva/sclera: Conjunctivae normal.     Right eye: Right conjunctiva is not injected.     Left eye: Left conjunctiva is not injected.  Neck:     Trachea: Trachea and phonation normal.  Cardiovascular:     Rate and Rhythm: Normal rate and regular rhythm.     Pulses: Normal pulses.     Heart sounds: Normal heart sounds. No murmur heard.    No friction rub. No gallop.  Pulmonary:     Effort: Pulmonary effort is normal. No accessory muscle usage, prolonged expiration or respiratory distress.     Breath sounds: Normal breath sounds. No stridor, decreased air movement or transmitted upper airway sounds. No decreased breath sounds, wheezing, rhonchi or rales.  Chest:     Chest wall: No tenderness.  Abdominal:     General: Abdomen is flat. Bowel sounds are normal. There is no distension.     Palpations: Abdomen is soft.     Tenderness: There is abdominal tenderness in the suprapubic area. There is no right CVA tenderness or left CVA tenderness.     Hernia: No hernia is present.  Musculoskeletal:        General: Normal range of motion.     Cervical back: Normal range of motion and neck supple. Normal range of motion.  Lymphadenopathy:     Cervical: No cervical adenopathy.  Skin:    General: Skin is warm and dry.     Findings: No erythema or rash.  Neurological:     General: No focal deficit present.     Mental Status: She is alert and oriented to person, place, and time.  Psychiatric:        Mood and Affect: Mood normal.        Behavior: Behavior normal.     Visual Acuity Right Eye Distance:   Left Eye Distance:   Bilateral Distance:    Right Eye Near:   Left Eye Near:    Bilateral Near:     UC Couse / Diagnostics / Procedures:     Radiology No results found.  Procedures Procedures  (including critical care time) EKG  Pending results:  Labs Reviewed  POCT URINALYSIS DIP (  MANUAL ENTRY) - Abnormal; Notable for the following components:      Result Value   Color, UA orange (*)    Clarity, UA cloudy (*)    Blood, UA moderate (*)    Protein Ur, POC =100 (*)    Leukocytes, UA Small (1+) (*)    All other components within normal limits  URINE CULTURE    Medications Ordered in UC: Medications - No data to display  UC Diagnoses / Final Clinical Impressions(s)   I have reviewed the triage vital signs and the nursing notes.  Pertinent labs & imaging results that were available during my care of the patient were reviewed by me and considered in my medical decision making (see chart for details).    Final diagnoses:  Dysuria  Cystitis   Patient was advised to begin antibiotics now due to findings on urine dip. Patient was advised to begin antibiotics today due to having active symptoms of urinary tract infection.                    Patient was advised to take all doses exactly as prescribed.  Patient also advised of risks of worsening infection with incomplete antibiotic therapy. Patient advised that they will be contacted with results and that adjustments to treatment will be provided as indicated based on the results.   Patient was advised of possibility that urine culture results may be negative if sample provided was obtained late in the day causing urine to be more diluted.  Patient was advised that if antibiotics were effective after the first 24 to 36 hours, despite negative urine culture result, it is recommended that they complete the full course as prescribed.   Return precautions advised.  ED Prescriptions     Medication Sig Dispense Auth. Provider   sulfamethoxazole-trimethoprim (BACTRIM DS) 800-160 MG tablet  (Status: Discontinued) Take 1 tablet by mouth 2 (two) times daily for 5 days. 10 tablet Theadora Rama Scales, PA-C   sulfamethoxazole-trimethoprim  (BACTRIM DS) 800-160 MG tablet Take 1 tablet by mouth 2 (two) times daily for 5 days. 10 tablet Theadora Rama Scales, PA-C      PDMP not reviewed this encounter.  Disposition Upon Discharge:  Condition: stable for discharge home  Patient presented with concern for an acute illness with associated systemic symptoms and significant discomfort requiring urgent management. In my opinion, this is a condition that a prudent lay person (someone who possesses an average knowledge of health and medicine) may potentially expect to result in complications if not addressed urgently such as respiratory distress, impairment of bodily function or dysfunction of bodily organs.   As such, the patient has been evaluated and assessed, work-up was performed and treatment was provided in alignment with urgent care protocols and evidence based medicine.  Patient/parent/caregiver has been advised that the patient may require follow up for further testing and/or treatment if the symptoms continue in spite of treatment, as clinically indicated and appropriate.  Routine symptom specific, illness specific and/or disease specific instructions were discussed with the patient and/or caregiver at length.  Prevention strategies for avoiding STD exposure were also discussed.  The patient will follow up with their current PCP if and as advised. If the patient does not currently have a PCP we will assist them in obtaining one.   The patient may need specialty follow up if the symptoms continue, in spite of conservative treatment and management, for further workup, evaluation, consultation and treatment as clinically indicated and appropriate.  Patient/parent/caregiver verbalized understanding and agreement of plan as discussed.  All questions were addressed during visit.  Please see discharge instructions below for further details of plan.  Discharge Instructions:   Discharge Instructions      The urinalysis that we  performed in the clinic today was abnormal.  Urine culture will be performed per our protocol.  The result of the urine culture will be available in the next 3 to 5 days and will be posted to your MyChart account.  If there is an abnormal finding, you will be contacted by phone and advised of further treatment recommendations, if any.   You were advised to begin antibiotics today because your urinalysis is abnormal and you are having active symptoms of an acute lower urinary tract infection also known as cystitis.  It is very important that you take all doses exactly as prescribed.  Incomplete antibiotic therapy can cause worsening urinary tract infection that can become aggressive, escape from urinary tract into your bloodstream causing sepsis which will require hospitalization.   Please pick up and begin taking your prescription for Bactrim DS (trimethoprim sulfamethoxazole) as soon as possible.  Please take all doses exactly as prescribed.  You can take this medication with or without food.  This medication is safe to take with your other medications.   If you receive a phone call advising you that your urine culture is negative but you begin to feel better after taking antibiotics for 24 hours, please feel free to complete the full course of antibiotics as they were likely needed and the urine culture result was false.  If your culture is negative and you do not feel any better, please return for repeat evaluation of other possible causes of your symptoms.   Please consider abstaining from sexual intercourse while you are being treated for urinary tract infection.   If you have not had complete resolution of your symptoms after completing treatment as prescribed, please return to urgent care for repeat evaluation or follow-up with your primary care provider.   Thank you for visiting urgent care today.  I appreciate the opportunity to participate in your care.       This office note has been  dictated using Teaching laboratory technician.  Unfortunately, this method of dictation can sometimes lead to typographical or grammatical errors.  I apologize for your inconvenience in advance if this occurs.  Please do not hesitate to reach out to me if clarification is needed.       Theadora Rama Scales, PA-C 12/09/21 1800

## 2021-12-09 NOTE — Discharge Instructions (Signed)
The urinalysis that we performed in the clinic today was abnormal.  Urine culture will be performed per our protocol.  The result of the urine culture will be available in the next 3 to 5 days and will be posted to your MyChart account.  If there is an abnormal finding, you will be contacted by phone and advised of further treatment recommendations, if any.   You were advised to begin antibiotics today because your urinalysis is abnormal and you are having active symptoms of an acute lower urinary tract infection also known as cystitis.  It is very important that you take all doses exactly as prescribed.  Incomplete antibiotic therapy can cause worsening urinary tract infection that can become aggressive, escape from urinary tract into your bloodstream causing sepsis which will require hospitalization.   Please pick up and begin taking your prescription for Bactrim DS (trimethoprim sulfamethoxazole) as soon as possible.  Please take all doses exactly as prescribed.  You can take this medication with or without food.  This medication is safe to take with your other medications.   If you receive a phone call advising you that your urine culture is negative but you begin to feel better after taking antibiotics for 24 hours, please feel free to complete the full course of antibiotics as they were likely needed and the urine culture result was false.  If your culture is negative and you do not feel any better, please return for repeat evaluation of other possible causes of your symptoms.   Please consider abstaining from sexual intercourse while you are being treated for urinary tract infection.   If you have not had complete resolution of your symptoms after completing treatment as prescribed, please return to urgent care for repeat evaluation or follow-up with your primary care provider.   Thank you for visiting urgent care today.  I appreciate the opportunity to participate in your care.

## 2021-12-11 ENCOUNTER — Emergency Department (HOSPITAL_BASED_OUTPATIENT_CLINIC_OR_DEPARTMENT_OTHER)
Admission: EM | Admit: 2021-12-11 | Discharge: 2021-12-11 | Disposition: A | Discharge status: Home / Self Care | Payer: Medicaid Other | Attending: Emergency Medicine | Admitting: Emergency Medicine

## 2021-12-11 ENCOUNTER — Other Ambulatory Visit: Payer: Self-pay

## 2021-12-11 ENCOUNTER — Encounter (HOSPITAL_BASED_OUTPATIENT_CLINIC_OR_DEPARTMENT_OTHER): Payer: Self-pay | Admitting: Emergency Medicine

## 2021-12-11 DIAGNOSIS — R3915 Urgency of urination: Secondary | ICD-10-CM | POA: Diagnosis present

## 2021-12-11 DIAGNOSIS — R5381 Other malaise: Secondary | ICD-10-CM | POA: Insufficient documentation

## 2021-12-11 DIAGNOSIS — F17210 Nicotine dependence, cigarettes, uncomplicated: Secondary | ICD-10-CM | POA: Insufficient documentation

## 2021-12-11 DIAGNOSIS — R112 Nausea with vomiting, unspecified: Secondary | ICD-10-CM

## 2021-12-11 DIAGNOSIS — N390 Urinary tract infection, site not specified: Secondary | ICD-10-CM | POA: Diagnosis not present

## 2021-12-11 LAB — URINALYSIS, ROUTINE W REFLEX MICROSCOPIC
Glucose, UA: NEGATIVE mg/dL
Ketones, ur: NEGATIVE mg/dL
Nitrite: POSITIVE — AB
Protein, ur: 100 mg/dL — AB
Specific Gravity, Urine: >=1.03 (ref 1.005–1.030)
pH: 5.5 (ref 5.0–8.0)

## 2021-12-11 LAB — URINE CULTURE: Culture: >=100000 — AB

## 2021-12-11 LAB — URINALYSIS, MICROSCOPIC (REFLEX): WBC, UA: >50 WBC/hpf (ref 0–5)

## 2021-12-11 MED ORDER — ONDANSETRON 4 MG PO TBDP
4.0000 mg | ORAL_TABLET | Freq: Once | ORAL | Status: AC | PRN
  Administered 2021-12-11: 4 mg via ORAL
  Filled 2021-12-11: qty 1

## 2021-12-11 MED ORDER — CEPHALEXIN 500 MG PO CAPS: 500.0000 mg | ORAL_CAPSULE | Freq: Two times a day (BID) | ORAL | 0 refills | Status: DC

## 2021-12-11 MED ORDER — ONDANSETRON 8 MG PO TBDP
8.0000 mg | ORAL_TABLET | Freq: Three times a day (TID) | ORAL | 0 refills | Status: DC | PRN
Start: 2021-12-11 — End: 2022-06-26

## 2021-12-11 MED ORDER — CEPHALEXIN 250 MG PO CAPS
1000.0000 mg | ORAL_CAPSULE | Freq: Once | ORAL | Status: AC
Start: 2021-12-11 — End: 2021-12-11
  Administered 2021-12-11: 1000 mg via ORAL
  Filled 2021-12-11: qty 4

## 2021-12-11 NOTE — ED Provider Notes (Signed)
[facility] Provider Note: Lowella Dell, MD, FACEP  CSN: [id number] MRN: [id number] ARRIVAL: 12/11/21 at 0158 ROOM: MH12/[facility]12   CHIEF COMPLAINT  Recurrent UTI   HISTORY OF PRESENT ILLNESS  12/11/21 3:49 AM Patricia Mcmahon is a 36 y.o. female who was seen in the [place] urgent care on 12/09/2021 for urinary urgency and back pain.  Urine dipstick was positive for blood and leukocytes and she was diagnosed with a urinary tract infection.  Urine culture grew out E. coli but antibiotic susceptibility is pending.  She was placed on trimethoprim/sulfamethoxazole and has had 3 doses.  She has had significant improvement in her dysuria and abdominal discomfort but is still having general malaise, fevers, nausea and vomiting.  The nausea and vomiting began yesterday and she has not kept much down since then.  She was given Zofran ODT in triage with significant improvement in her nausea.   Past Medical History:  Diagnosis Date   GERD (gastroesophageal reflux disease)    no current med.   Left knee pain 12/16/2012    Past Surgical History:  Procedure Laterality Date   CESAREAN SECTION     ESOPHAGOGASTRODUODENOSCOPY N/A 01/14/2021   Procedure: ESOPHAGOGASTRODUODENOSCOPY (EGD);  Surgeon: Willis Modena, MD;  Location: [facility];  Service: Endoscopy;  Laterality: N/A;   GASTRECTOMY     sleeve   KNEE ARTHROSCOPY  04/02/2012   Procedure: ARTHROSCOPY KNEE;  Surgeon: Velna Ochs, MD;  Location: [facility];  Service: Orthopedics;  Laterality: Left;  left knee arthroscopy chondroplasty   KNEE ARTHROSCOPY WITH LATERAL RELEASE Left 01/07/2013   Procedure: LEFT KNEE ARTHROSCOPY, CHONDROPLASTY AND  LATERAL RELEASE;  Surgeon: Velna Ochs, MD;  Location: [facility];  Service: Orthopedics;  Laterality: Left;    Family History  Problem Relation Age of Onset   Alcohol abuse Mother    Cancer Mother    Learning disabilities Father    Alcohol abuse  Father    Heart disease Maternal Grandmother    Diabetes Maternal Grandfather     Social History   Tobacco Use   Smoking status: Every Day    Packs/day: 1.00    Years: 16.00    Total pack years: 16.00    Types: Cigarettes   Smokeless tobacco: Never  Vaping Use   Vaping Use: Never used  Substance Use Topics   Alcohol use: Yes    Comment: socially   Drug use: No    Prior to Admission medications   Medication Sig Start Date End Date Taking? Authorizing Provider  cephALEXin (KEFLEX) 500 MG capsule Take 1 capsule (500 mg total) by mouth 2 (two) times daily. 12/11/21  Yes Chuong Casebeer, MD  ondansetron (ZOFRAN-ODT) 8 MG disintegrating tablet Take 1 tablet (8 mg total) by mouth every 8 (eight) hours as needed for nausea or vomiting. 8mg  ODT q4 hours prn nausea 12/11/21  Yes Joshlyn Beadle, MD  levonorgestrel (MIRENA) 20 MCG/24HR IUD 1 each by Intrauterine route once. Implanted 1st part of the year 2020    [provider]    Allergies Morphine and related   REVIEW OF SYSTEMS  Negative except as noted here or in the History of Present Illness.   PHYSICAL EXAMINATION  Initial Vital Signs Blood pressure 95/75, pulse 87, temperature 98.2 F (36.8 C), temperature source Oral, resp. rate 18, height 5' 8.5" (1.74 m), weight 70.3 kg, SpO2 99 %.  Examination General: Well-developed, well-nourished female in no acute distress; appearance consistent with age  of record HENT: normocephalic; atraumatic Eyes: Normal appearance Neck: supple Heart: regular rate and rhythm Lungs: clear to auscultation bilaterally Abdomen: soft; nondistended; nontender; bowel sounds present Extremities: No deformity; full range of motion; pulses normal Neurologic: Awake, alert and oriented; motor function intact in all extremities and symmetric; no facial droop Skin: Warm and dry Psychiatric: Normal mood and affect   RESULTS  Summary of this visit's results, reviewed and interpreted by myself:    EKG Interpretation  Date/Time:    Ventricular Rate:    PR Interval:    QRS Duration:   QT Interval:    QTC Calculation:   R Axis:     Text Interpretation:         Laboratory Studies: Results for orders placed or performed during the hospital encounter of 12/11/21 (from the past 24 hour(s))  Urinalysis, Routine w reflex microscopic Urine, Clean Catch     Status: Abnormal   Collection Time: 12/11/21  4:15 AM  Result Value Ref Range   Color, Urine YELLOW YELLOW   APPearance TURBID (A) CLEAR   Specific Gravity, Urine >=1.030 1.005 - 1.030   pH 5.5 5.0 - 8.0   Glucose, UA NEGATIVE NEGATIVE mg/dL   Hgb urine dipstick LARGE (A) NEGATIVE   Bilirubin Urine SMALL (A) NEGATIVE   Ketones, ur NEGATIVE NEGATIVE mg/dL   Protein, ur 742 (A) NEGATIVE mg/dL   Nitrite POSITIVE (A) NEGATIVE   Leukocytes,Ua SMALL (A) NEGATIVE  Urinalysis, Microscopic (reflex)     Status: Abnormal   Collection Time: 12/11/21  4:15 AM  Result Value Ref Range   RBC / HPF 0-5 0 - 5 RBC/hpf   WBC, UA >50 0 - 5 WBC/hpf   Bacteria, UA MANY (A) NONE SEEN   Squamous Epithelial / LPF 21-50 0 - 5   WBC Clumps PRESENT    Mucus PRESENT    Imaging Studies: No results found.  ED COURSE and MDM  Nursing notes, initial and subsequent vitals signs, including pulse oximetry, reviewed and interpreted by myself.  Vitals:   12/11/21 0218 12/11/21 0219 12/11/21 0220  BP:   95/75  Pulse:   87  Resp:   18  Temp:   98.2 F (36.8 C)  TempSrc:   Oral  SpO2: 99%  99%  Weight:  70.3 kg   Height:  5' 8.5" (1.74 m)    Medications  cephALEXin (KEFLEX) capsule 1,000 mg (has no administration in time range)  ondansetron (ZOFRAN-ODT) disintegrating tablet 4 mg (4 mg Oral Given 12/11/21 0227)   Patient declines IV fluids.   4:37 AM [name] urinalysis is still showing changes consistent with an active urinary tract infection.  This could represent a resistant organism or could indicate the Bactrim has not yet made a  significant impact.  As susceptibilities are still pending we will go ahead and switch her to Keflex and when susceptibilities are resulted we will reassess.  She had a good response to Zofran and we will provide a prescription for Zofran.   PROCEDURES  Procedures   ED DIAGNOSES     ICD-10-CM   1. Lower urinary tract infectious disease  N39.0     2. Nausea and vomiting in adult  R11.2          Paula Libra, MD 12/11/21 [phone]

## 2021-12-11 NOTE — ED Triage Notes (Signed)
Seen at [facility] yesterday. Had 3 doses of abx and states "its not working." Urine cx noted to be positive for E. Coli.

## 2021-12-12 ENCOUNTER — Other Ambulatory Visit (HOSPITAL_BASED_OUTPATIENT_CLINIC_OR_DEPARTMENT_OTHER): Payer: Self-pay

## 2021-12-13 LAB — URINE CULTURE: Culture: >=100000 — AB

## 2021-12-14 ENCOUNTER — Telehealth: Payer: Self-pay

## 2021-12-14 NOTE — Telephone Encounter (Signed)
Post ED Visit - Positive Culture Follow-up  Culture report reviewed by antimicrobial stewardship pharmacist: [facility] [x]  , Pharm.D. []  Estill Batten, Pharm.D., BCPS AQ-ID []  , Pharm.D., BCPS []  Celedonio Miyamoto, Pharm.D., BCPS []  [place], Garvin Fila.D., BCPS, AAHIVP []  , Pharm.D., BCPS, AAHIVP []  Georgina Pillion, PharmD, BCPS []  , PharmD, BCPS []  [place], PharmD, BCPS []  [address], PharmD []  , PharmD, BCPS []  Estella Husk, PharmD  Pharmacy Team []  Lysle Pearl, PharmD []  , PharmD []  Phillips Climes, PharmD []  , Rph []  Agapito Games) , PharmD []  Verlan Friends, PharmD []  , PharmD []  Mervyn Gay, PharmD []  , PharmD []  Vinnie Level, PharmD []  [facility], PharmD []  , PharmD []  Len Childs, PharmD   Positive urine culture Treated with Cephalexin, organism sensitive to the same and no further patient follow-up is required at this time.  12/14/2021, 11:32 AM

## 2022-01-09 ENCOUNTER — Other Ambulatory Visit (HOSPITAL_BASED_OUTPATIENT_CLINIC_OR_DEPARTMENT_OTHER): Payer: Self-pay

## 2022-01-09 ENCOUNTER — Emergency Department (HOSPITAL_BASED_OUTPATIENT_CLINIC_OR_DEPARTMENT_OTHER)
Admission: EM | Admit: 2022-01-09 | Discharge: 2022-01-09 | Disposition: A | Discharge status: Home / Self Care | Payer: Medicaid Other | Attending: Emergency Medicine | Admitting: Emergency Medicine

## 2022-01-09 ENCOUNTER — Other Ambulatory Visit: Payer: Self-pay

## 2022-01-09 ENCOUNTER — Encounter (HOSPITAL_BASED_OUTPATIENT_CLINIC_OR_DEPARTMENT_OTHER): Payer: Self-pay

## 2022-01-09 ENCOUNTER — Emergency Department (HOSPITAL_BASED_OUTPATIENT_CLINIC_OR_DEPARTMENT_OTHER): Payer: Medicaid Other

## 2022-01-09 DIAGNOSIS — N3001 Acute cystitis with hematuria: Secondary | ICD-10-CM | POA: Diagnosis not present

## 2022-01-09 DIAGNOSIS — N12 Tubulo-interstitial nephritis, not specified as acute or chronic: Secondary | ICD-10-CM | POA: Diagnosis not present

## 2022-01-09 DIAGNOSIS — R300 Dysuria: Secondary | ICD-10-CM | POA: Diagnosis present

## 2022-01-09 LAB — COMPREHENSIVE METABOLIC PANEL
ALT: 17 U/L (ref 0–44)
AST: 30 U/L (ref 15–41)
Albumin: 4 g/dL (ref 3.5–5.0)
Alkaline Phosphatase: 60 U/L (ref 38–126)
Anion gap: 8 (ref 5–15)
BUN: 8 mg/dL (ref 6–20)
CO2: 26 mmol/L (ref 22–32)
Calcium: 9.5 mg/dL (ref 8.9–10.3)
Chloride: 103 mmol/L (ref 98–111)
Creatinine, Ser: 0.86 mg/dL (ref 0.44–1.00)
GFR, Estimated: >60 mL/min (ref >60)
Glucose, Bld: 107 mg/dL — ABNORMAL HIGH (ref 70–99)
Potassium: 3.8 mmol/L (ref 3.5–5.1)
Sodium: 137 mmol/L (ref 135–145)
Total Bilirubin: 1.2 mg/dL (ref 0.3–1.2)
Total Protein: 7.1 g/dL (ref 6.5–8.1)

## 2022-01-09 LAB — URINALYSIS, ROUTINE W REFLEX MICROSCOPIC
Glucose, UA: NEGATIVE mg/dL
Ketones, ur: NEGATIVE mg/dL
Nitrite: POSITIVE — AB
Protein, ur: 100 mg/dL — AB
Specific Gravity, Urine: 1.025 (ref 1.005–1.030)
pH: 6 (ref 5.0–8.0)

## 2022-01-09 LAB — CBC WITH DIFFERENTIAL/PLATELET
Abs Immature Granulocytes: 0.04 10*3/uL (ref 0.00–0.07)
Basophils Absolute: 0 10*3/uL (ref 0.0–0.1)
Basophils Relative: 0 %
Eosinophils Absolute: 0 10*3/uL (ref 0.0–0.5)
Eosinophils Relative: 0 %
HCT: 42.3 % (ref 36.0–46.0)
Hemoglobin: 14.3 g/dL (ref 12.0–15.0)
Immature Granulocytes: 1 %
Lymphocytes Relative: 6 %
Lymphs Abs: 0.5 10*3/uL — ABNORMAL LOW (ref 0.7–4.0)
MCH: 32.6 pg (ref 26.0–34.0)
MCHC: 33.8 g/dL (ref 30.0–36.0)
MCV: 96.6 fL (ref 80.0–100.0)
Monocytes Absolute: 0.8 10*3/uL (ref 0.1–1.0)
Monocytes Relative: 9 %
Neutro Abs: 7 10*3/uL (ref 1.7–7.7)
Neutrophils Relative %: 84 %
Platelets: 167 10*3/uL (ref 150–400)
RBC: 4.38 MIL/uL (ref 3.87–5.11)
RDW: 13.6 % (ref 11.5–15.5)
WBC: 8.4 10*3/uL (ref 4.0–10.5)
nRBC: 0 % (ref 0.0–0.2)

## 2022-01-09 LAB — URINALYSIS, MICROSCOPIC (REFLEX): WBC, UA: >50 WBC/hpf (ref 0–5)

## 2022-01-09 LAB — LIPASE, BLOOD: Lipase: 23 U/L (ref 11–51)

## 2022-01-09 LAB — PREGNANCY, URINE: Preg Test, Ur: NEGATIVE

## 2022-01-09 MED ORDER — CIPROFLOXACIN HCL 500 MG PO TABS
500.0000 mg | ORAL_TABLET | Freq: Two times a day (BID) | ORAL | 0 refills | Status: DC
  Filled 2022-01-09: qty 10, 5d supply, fill #0

## 2022-01-09 MED ORDER — SODIUM CHLORIDE 0.9 % IV SOLN
1.0000 g | Freq: Once | INTRAVENOUS | Status: AC
  Administered 2022-01-09: 1 g via INTRAVENOUS
  Filled 2022-01-09: qty 10

## 2022-01-09 MED ORDER — METRONIDAZOLE 500 MG PO TABS
500.0000 mg | ORAL_TABLET | Freq: Two times a day (BID) | ORAL | 0 refills | Status: AC
  Filled 2022-01-09: qty 10, 5d supply, fill #0

## 2022-01-09 MED ORDER — SODIUM CHLORIDE 0.9 % IV SOLN: INTRAVENOUS | Status: DC | PRN

## 2022-01-09 MED ORDER — ONDANSETRON HCL 4 MG PO TABS
4.0000 mg | ORAL_TABLET | Freq: Three times a day (TID) | ORAL | 0 refills | Status: DC | PRN
  Filled 2022-01-09: qty 12, 4d supply, fill #0

## 2022-01-09 NOTE — ED Provider Notes (Signed)
[facility] EMERGENCY DEPARTMENT Provider Note   CSN: [id number] Arrival date & time: 01/09/22  6301     History  Chief Complaint  Patient presents with   Dysuria    Patricia MCELHANEY is a 36 y.o. female with a PMHx of UTI, who presents to the ED complaining of dysuria onset 6 days.  Has associated left flank pain, urinary frequency, urgency, suprapubic abdominal pain, vomiting (resolved 2 days ago).  No meds tried prior to arrival.  Has a history of frequent UTIs with this being her fourth 1 this year.  Denies hematuria, vaginal bleeding/discharge, nausea.  Patient is not seen a urologist for his symptoms.     The history is provided by the patient. No language interpreter was used.       Home Medications Prior to Admission medications   Medication Sig Start Date End Date Taking? Authorizing Provider  ciprofloxacin (CIPRO) 500 MG tablet Take 1 tablet (500 mg total) by mouth every 12 (twelve) hours. 01/09/22  Yes Jerald Hennington A, PA-C  metroNIDAZOLE (FLAGYL) 500 MG tablet Take 1 tablet (500 mg total) by mouth 2 (two) times daily for 5 days. 01/09/22 01/14/22 Yes Lennie Dunnigan A, PA-C  ondansetron (ZOFRAN) 4 MG tablet Take 1 tablet (4 mg total) by mouth every 8 (eight) hours as needed for nausea or vomiting. 01/09/22  Yes Escher Harr A, PA-C  cephALEXin (KEFLEX) 500 MG capsule Take 1 capsule (500 mg total) by mouth 2 (two) times daily. 12/11/21   Molpus, John, MD  levonorgestrel (MIRENA) 20 MCG/24HR IUD 1 each by Intrauterine route once. Implanted 1st part of the year 2020    [provider]  ondansetron (ZOFRAN-ODT) 8 MG disintegrating tablet Take 1 tablet (8 mg total) by mouth every 8 (eight) hours as needed for nausea or vomiting. 8mg  ODT q4 hours prn nausea 12/11/21   Molpus, John, MD      Allergies    Morphine and related    Review of Systems   Review of Systems  Gastrointestinal:  Positive for abdominal pain (suprapubic) and vomiting (resolved). Negative for  nausea.  Genitourinary:  Positive for dysuria, flank pain (left), frequency and urgency. Negative for hematuria, vaginal bleeding and vaginal discharge.  All other systems reviewed and are negative.   Physical Exam Updated Vital Signs BP 118/80   Pulse 92   Temp 98.1 F (36.7 C)   Resp 18   Ht 5' 8.5" (1.74 m)   Wt 70.3 kg   SpO2 98%   BMI 23.22 kg/m  Physical Exam Vitals and nursing note reviewed.  Constitutional:      General: She is not in acute distress.    Appearance: She is not diaphoretic.  HENT:     Head: Normocephalic and atraumatic.     Mouth/Throat:     Pharynx: No oropharyngeal exudate.  Eyes:     General: No scleral icterus.    Conjunctiva/sclera: Conjunctivae normal.  Cardiovascular:     Rate and Rhythm: Normal rate and regular rhythm.     Pulses: Normal pulses.     Heart sounds: Normal heart sounds.  Pulmonary:     Effort: Pulmonary effort is normal. No respiratory distress.     Breath sounds: Normal breath sounds. No wheezing.  Abdominal:     General: Bowel sounds are normal.     Palpations: Abdomen is soft. There is no mass.     Tenderness: There is abdominal tenderness in the suprapubic area. There is left CVA tenderness.  There is no right CVA tenderness, guarding or rebound.     Comments: Suprapubic abdominal tenderness to palpation.  Left CVA tenderness to palpation noted on exam.  Musculoskeletal:        General: Normal range of motion.     Cervical back: Normal range of motion and neck supple.  Skin:    General: Skin is warm and dry.  Neurological:     Mental Status: She is alert.  Psychiatric:        Behavior: Behavior normal.     ED Results / Procedures / Treatments   Labs (all labs ordered are listed, but only abnormal results are displayed) Labs Reviewed  URINALYSIS, ROUTINE W REFLEX MICROSCOPIC - Abnormal; Notable for the following components:      Result Value   Color, Urine AMBER (*)    APPearance HAZY (*)    Hgb urine dipstick  TRACE (*)    Bilirubin Urine SMALL (*)    Protein, ur 100 (*)    Nitrite POSITIVE (*)    Leukocytes,Ua MODERATE (*)    All other components within normal limits  URINALYSIS, MICROSCOPIC (REFLEX) - Abnormal; Notable for the following components:   Bacteria, UA FEW (*)    All other components within normal limits  CBC WITH DIFFERENTIAL/PLATELET - Abnormal; Notable for the following components:   Lymphs Abs 0.5 (*)    All other components within normal limits  COMPREHENSIVE METABOLIC PANEL - Abnormal; Notable for the following components:   Glucose, Bld 107 (*)    All other components within normal limits  URINE CULTURE  PREGNANCY, URINE  LIPASE, BLOOD    EKG None  Radiology CT Renal Stone Study  Result Date: 01/09/2022 CLINICAL DATA:  Flank pain EXAM: CT ABDOMEN AND PELVIS WITHOUT CONTRAST TECHNIQUE: Multidetector CT imaging of the abdomen and pelvis was performed following the standard protocol without IV contrast. RADIATION DOSE REDUCTION: This exam was performed according to the departmental dose-optimization program which includes automated exposure control, adjustment of the mA and/or kV according to patient size and/or use of iterative reconstruction technique. COMPARISON:  CT chest abdomen and pelvis 05/04/2018 FINDINGS: Lower chest: No acute abnormality. Hepatobiliary: Liver is normal contour. No evidence of perihepatic fluid. There is cholelithiasis without CT findings to suggest cholecystitis. Pancreas: Unremarkable. No pancreatic ductal dilatation or surrounding inflammatory changes. Spleen: Normal in size without focal abnormality. Adrenals/Urinary Tract: The left kidney is asymmetrically enlarged in size. 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 There is asymmetric thickening of the ascending and proximal transverse colon (series 5, images 45-53). The appendix is normal in  appearance. There is a small amount of free fluid in the pelvis in the perirectal space (series 2, image 64). Vascular/Lymphatic: No significant vascular findings are present. No enlarged abdominal or pelvic lymph nodes. Reproductive: There is an IUD in place. Other: No abdominal wall hernia or abnormality. No abdominopelvic ascites. Musculoskeletal: No acute or significant osseous findings. IMPRESSION: 1. No evidence of hydronephrosis or nephrolithiasis. Assessment for pyelonephritis is limited in the absence of IV contrast, but there is slight interval increase in the amount of perinephric fluid adjacent to the left kidney compared to prior exam dated 05/04/2021. 2. Nonspecific bowel wall thickening of the ascending and proximal transverse colon, which could be seen in the setting of nonspecific colitis, which could include infectious, ischemic, or inflammatory etiologies. Electronically Signed   By: Lorenza Cambridge M.D.   On:  01/09/2022 12:16    Procedures Procedures    Medications Ordered in ED Medications  cefTRIAXone (ROCEPHIN) 1 g in sodium chloride 0.9 % 100 mL IVPB (has no administration in time range)    ED Course/ Medical Decision Making/ A&P Clinical Course as of 01/09/22 1333  Mon Jan 09, 2022  1301 Patient reevaluated and patient tolerating p.o. intake well without difficulty.  Discussed with patient treatment plan consisted of IV antibiotics here in the emergency department.  Patient agreeable at this time.  Discussed with patient we will treat for colitis and pyelonephritis in the outpatient setting.  Patient will be provided with information for urologist.  Patient appreciated at this time.  Patient appears safe for discharge at this time. [SB]    Clinical Course User Index [SB] Christain Niznik A, PA-C                           Medical Decision Making Amount and/or Complexity of Data Reviewed Labs: ordered. Radiology: ordered.  Risk Prescription drug management.    Pt  presents with concerns for dysuria onset 6 days.  Has associated frequency, suprapubic abdominal pain.  Has been evaluated previously for UTI several times this year.  No urologist at this time.  Patient afebrile.  On exam patient with suprapubic abdominal tenderness to palpation.  Left CVA tenderness to palpation noted.  No acute cardiovascular respiratory exam findings.  Differential diagnosis includes acute cystitis, pyelonephritis, nephrolithiasis.   Labs:  I ordered, and personally interpreted labs.  The pertinent results include:   Lipase unremarkable CBC without leukocytosis CMP unremarkable Urinalysis with concerns for UTI at this time  Imaging: I ordered imaging studies including CT renal I independently visualized and interpreted imaging which showed:  1. No evidence of hydronephrosis or nephrolithiasis. Assessment for  pyelonephritis is limited in the absence of IV contrast, but there  is slight interval increase in the amount of perinephric fluid  adjacent to the left kidney compared to prior exam dated 05/04/2021.  2. Nonspecific bowel wall thickening of the ascending and proximal  transverse colon, which could be seen in the setting of nonspecific  colitis, which could include infectious, ischemic, or inflammatory  etiologies.   I agree with the radiologist interpretation  Medications:  I ordered medication including IV Rocephin for antibiotic prophylaxis   Disposition: Patient presenting suspicious for pyelonephritis as well as acute cystitis with hematuria.  Doubt hydronephrosis or nephrolithiasis at this time.  Also suspicious for colitis, doubt concerns at this time for pancreatitis, diverticulitis, appendicitis at this time. After consideration of the diagnostic results and the patients response to treatment, I feel that the patient would benefit from Discharge home.  Patient will be discharged with a prescription for Cipro, Flagyl, Zofran.  Also provided with  information for urologist for follow-up regarding today's ED visit.  Supportive care measures and strict return precautions discussed with patient at bedside. Pt acknowledges and verbalizes understanding. Pt appears safe for discharge. Follow up as indicated in discharge paperwork.    This chart was dictated using voice recognition software, Dragon. Despite the best efforts of this provider to proofread and correct errors, errors may still occur which can change documentation meaning.   Final Clinical Impression(s) / ED Diagnoses Final diagnoses:  Pyelonephritis  Acute cystitis with hematuria    Rx / DC Orders ED Discharge Orders          Ordered    ciprofloxacin (CIPRO) 500 MG tablet  Every 12 hours        01/09/22 1327    metroNIDAZOLE (FLAGYL) 500 MG tablet  2 times daily        01/09/22 1327    ondansetron (ZOFRAN) 4 MG tablet  Every 8 hours PRN        01/09/22 1328              Cornell Bourbon A, PA-C 01/09/22 1850    Tegeler, Canary Brim, MD 01/10/22 [phone]

## 2022-01-09 NOTE — ED Triage Notes (Signed)
C/o left lower back pain, sore throat, burning with urination, chills since last Tuesday. States has these symptoms when she gets UTI. Has had many frequent UTI's recently. Vomiting 2 days ago

## 2022-01-09 NOTE — Discharge Instructions (Addendum)
It was a pleasure taking care of you today!  Your labs today were overall unremarkable.  Your urine showed concerns for UTI today.  The CT scan today showed concerns for pyelonephritis as well as mild colitis.  You will be sent antibiotics over (Cipro, Flagyl) to your pharmacy for this.  Take as directed.  You also be sent a prescription for Zofran to take as needed for nausea or vomiting.  Ensure to maintain fluid intake.  Follow-up with your primary care provider as needed.  Attached will be information for the on-call urologist, call and set up a follow-up appointment regarding today's ED visit.  Return to the emergency department if you are experiencing increasing/worsening symptoms.

## 2022-01-11 LAB — URINE CULTURE: Culture: 60000 — AB

## 2022-01-12 ENCOUNTER — Telehealth (HOSPITAL_BASED_OUTPATIENT_CLINIC_OR_DEPARTMENT_OTHER): Payer: Self-pay | Admitting: *Deleted

## 2022-01-12 NOTE — Telephone Encounter (Signed)
Post ED Visit - Positive Culture Follow-up  Culture report reviewed by antimicrobial stewardship pharmacist: [place] Team []  Elenor Quinones, Pharm.D. []  Heide Guile, Pharm.D., BCPS AQ-ID []  Parks Neptune, Pharm.D., BCPS []  Alycia Rossetti, Pharm.D., BCPS []  [place], [place].D., BCPS, AAHIVP []  Legrand Como, Pharm.D., BCPS, AAHIVP []  Salome Arnt, PharmD, BCPS []  Johnnette Gourd, PharmD, BCPS []  Hughes Better, PharmD, BCPS []  Leeroy Cha, PharmD []  Laqueta Linden, PharmD, BCPS []  Albertina Parr, PharmD  [place] Team []  Leodis Sias, PharmD []  Lindell Spar, PharmD []  Royetta Asal, PharmD []  Graylin Shiver, Rph []  Rema Fendt) Glennon Mac, PharmD []  Arlyn Dunning, PharmD []  Netta Cedars, PharmD []  Dia Sitter, PharmD []  Leone Haven, PharmD []  Gretta Arab, PharmD []  Theodis Shove, PharmD []  Peggyann Juba, PharmD []  Reuel Boom, PharmD   Positive urine culture Treated with Ciprofloxacin HCL, organism sensitive to the same and no further patient follow-up is required at this time. Bertis Ruddy, PharmD  Harlon Flor Talley 01/12/2022, 7:36 AM

## 2022-03-29 DIAGNOSIS — Z0000 Encounter for general adult medical examination without abnormal findings: Secondary | ICD-10-CM | POA: Diagnosis not present

## 2022-04-11 ENCOUNTER — Telehealth: Payer: Self-pay | Admitting: Family Medicine

## 2022-04-11 DIAGNOSIS — F319 Bipolar disorder, unspecified: Secondary | ICD-10-CM

## 2022-04-11 NOTE — Telephone Encounter (Signed)
Patient states she got a referral a year ago for [employer], but when she called they told her the referral needs to be placed again. She would like to get back on bipolar medication. Please advise.

## 2022-04-14 NOTE — Telephone Encounter (Signed)
Pt stated they have not received referral. She is worried because she only has a couple days left of bipolar meds. Advised pt a message would be sent to pcp to resubmit referral and gave the # and address of [facility] just in case.

## 2022-04-14 NOTE — Telephone Encounter (Signed)
Pt aware.

## 2022-04-18 NOTE — Telephone Encounter (Signed)
Pt has a pending appointment for tomorrow. She asks for refill on Vraylar to be sent to [facility] [address].

## 2022-04-19 ENCOUNTER — Encounter: Payer: Self-pay | Admitting: Family Medicine

## 2022-04-19 ENCOUNTER — Ambulatory Visit: Payer: Medicaid Other | Admitting: Family Medicine

## 2022-04-19 VITALS — BP 104/79 | HR 83 | Temp 97.9°F | Resp 16 | Ht 69.0 in | Wt 159.4 lb

## 2022-04-19 DIAGNOSIS — Z5181 Encounter for therapeutic drug level monitoring: Secondary | ICD-10-CM | POA: Diagnosis not present

## 2022-04-19 DIAGNOSIS — F319 Bipolar disorder, unspecified: Secondary | ICD-10-CM | POA: Diagnosis not present

## 2022-04-19 MED ORDER — CARIPRAZINE HCL 1.5 MG PO CAPS: 1.5000 mg | ORAL_CAPSULE | Freq: Every day | ORAL | 0 refills | Status: DC

## 2022-04-19 NOTE — Progress Notes (Signed)
Established Patient Office Visit  Subjective   Patient ID: Patricia Mcmahon, female    DOB: April 14, 1986  Age: 37 y.o. MRN: [id number]  Chief Complaint  Patient presents with   Medication Refill    Patient here to discuss her bipolar/depression.    She had been at a private practice, but provider left so she has been trying to get in with [facility] but first available appointment isn't until end of February. She was diagnosed with bipolar, anxiety, and PTSD as a teenager. Reports that most recently she was doing well on Vraylar 3 mg daily. She lost insurance for awhile and stopped taking meds, but reports she was fine for awhile until the past few months. She has felt more depressive symptoms. Reports that it is hard for her to get up out of bed in the mornings. She has lost 2 jobs because of this. She has been feeling very down, but denies an SI/HI. She would really like to restart her Vraylar. She had previously message me requesting a referral, but she cannot be seen for another 6 weeks or so. She is hoping to start a new job next week and really wants to be in a better mental state. She reports she tolerated the Vraylar well and did not have any adverse effects.       ROS All review of systems negative except what is listed in the HPI    Objective:     BP 104/79   Pulse 83   Temp 97.9 F (36.6 C)   Resp 16   Ht 5' 9" (1.753 m)   Wt 159 lb 6.4 oz (72.3 kg)   SpO2 96%   BMI 23.54 kg/m    Physical Exam Vitals reviewed.  Constitutional:      Appearance: Normal appearance.  Cardiovascular:     Rate and Rhythm: Normal rate and regular rhythm.     Pulses: Normal pulses.     Heart sounds: Normal heart sounds.  Pulmonary:     Effort: Pulmonary effort is normal.     Breath sounds: Normal breath sounds.  Skin:    General: Skin is warm and dry.  Neurological:     Mental Status: She is alert and oriented to person, place, and time.  Psychiatric:        Mood and  Affect: Mood normal.        Behavior: Behavior normal.        Thought Content: Thought content normal.        Judgment: Judgment normal.      No results found for any visits on 04/19/22.    The ASCVD Risk score (Arnett DK, et al., 2019) failed to calculate for the following reasons:   The 2019 ASCVD risk score is only valid for ages 70 to 87    Assessment & Plan:   Problem List Items Addressed This Visit   None Visit Diagnoses     Bipolar depression ([place])    -  Primary   Relevant Medications   cariprazine (VRAYLAR) 1.5 MG capsule   Other Relevant Orders   CBC w/Diff   Comp Met (CMET)   Lipid panel   Medication monitoring encounter       Relevant Medications   cariprazine (VRAYLAR) 1.5 MG capsule   Other Relevant Orders   CBC w/Diff   Comp Met (CMET)   Lipid panel       We had a thorough conversation regarding safety and medications.  Restarting Vraylar at lowest dose, with the agreement to follow-up closely until you can get in with Behavioral Health and let them take over.  Stop the medication and seek emergent help if you develop any thoughts of hurting yourself or others.  Review the medication info guide attached.  Baseline labs today        Return in about 3 weeks (around 05/10/2022) for mood f/u.     B , NP  

## 2022-04-19 NOTE — Patient Instructions (Signed)
We had a thorough conversation regarding safety and medications.  Restarting Vraylar at lowest dose, with the agreement to follow-up closely until you can get in with [facility] and let them take over.  Stop the medication and seek emergent help if you develop any thoughts of hurting yourself or others.  Review the medication info guide attached.  Baseline labs today   [place] [facility] - Urgent care services - Outpatient services: Individual Therapy Partial Hospitalization Program (PHP) Substance Abuse Intensive Outpatient Program [facility]) Specialized Intensive Adult Group Therapy Medication Management Peer Living Room  Phone: [phone]  Address: [place], [place] [address]  Hours: Open 24/7, No appointment required.

## 2022-04-20 LAB — CBC WITH DIFFERENTIAL/PLATELET
Basophils Absolute: 0 10*3/uL (ref 0.0–0.1)
Basophils Relative: 0.7 % (ref 0.0–3.0)
Eosinophils Absolute: 0.1 10*3/uL (ref 0.0–0.7)
Eosinophils Relative: 2.2 % (ref 0.0–5.0)
HCT: 44.3 % (ref 36.0–46.0)
Hemoglobin: 14.9 g/dL (ref 12.0–15.0)
Lymphocytes Relative: 20.2 % (ref 12.0–46.0)
Lymphs Abs: 1.2 10*3/uL (ref 0.7–4.0)
MCHC: 33.7 g/dL (ref 30.0–36.0)
MCV: 95.3 fl (ref 78.0–100.0)
Monocytes Absolute: 0.5 10*3/uL (ref 0.1–1.0)
Monocytes Relative: 8 % (ref 3.0–12.0)
Neutro Abs: 4 10*3/uL (ref 1.4–7.7)
Neutrophils Relative %: 68.9 % (ref 43.0–77.0)
Platelets: 190 10*3/uL (ref 150.0–400.0)
RBC: 4.65 Mil/uL (ref 3.87–5.11)
RDW: 12.8 % (ref 11.5–15.5)
WBC: 5.8 10*3/uL (ref 4.0–10.5)

## 2022-04-20 LAB — LIPID PANEL
Cholesterol: 131 mg/dL (ref 0–200)
HDL: 72 mg/dL (ref >39.00)
LDL Cholesterol: 45 mg/dL (ref 0–99)
NonHDL: 58.76
Total CHOL/HDL Ratio: 2
Triglycerides: 71 mg/dL (ref 0.0–149.0)
VLDL: 14.2 mg/dL (ref 0.0–40.0)

## 2022-04-20 LAB — COMPREHENSIVE METABOLIC PANEL
ALT: 12 U/L (ref 0–35)
AST: 16 U/L (ref 0–37)
Albumin: 4.4 g/dL (ref 3.5–5.2)
Alkaline Phosphatase: 54 U/L (ref 39–117)
BUN: 14 mg/dL (ref 6–23)
CO2: 27 mEq/L (ref 19–32)
Calcium: 10.1 mg/dL (ref 8.4–10.5)
Chloride: 105 mEq/L (ref 96–112)
Creatinine, Ser: 0.79 mg/dL (ref 0.40–1.20)
GFR: 94.79 mL/min (ref >60.00)
Glucose, Bld: 88 mg/dL (ref 70–99)
Potassium: 4.4 mEq/L (ref 3.5–5.1)
Sodium: 138 mEq/L (ref 135–145)
Total Bilirubin: 0.6 mg/dL (ref 0.2–1.2)
Total Protein: 6.9 g/dL (ref 6.0–8.3)

## 2022-04-21 ENCOUNTER — Telehealth: Payer: Self-pay | Admitting: Family Medicine

## 2022-04-21 NOTE — Telephone Encounter (Signed)
PA initiated via Covermymeds; KEY: [id number]. PA approved.   PA Case: [id number], Status: Approved, Coverage Starts on: 04/21/2022 12:00:00 AM, Coverage Ends on: 04/21/2023 12:00:00 AM.

## 2022-04-21 NOTE — Telephone Encounter (Signed)
Patient called to advise that cariprazine (VRAYLAR) 1.5 MG capsule  requires prior auth. Pharmacy was supposed to send [place] something. Patient is curious about how long it takes from the time we submit to get a response. Please call to advise.

## 2022-05-02 DIAGNOSIS — R109 Unspecified abdominal pain: Secondary | ICD-10-CM | POA: Insufficient documentation

## 2022-05-02 DIAGNOSIS — K561 Intussusception: Secondary | ICD-10-CM | POA: Insufficient documentation

## 2022-05-04 ENCOUNTER — Other Ambulatory Visit: Payer: Self-pay | Admitting: Surgery

## 2022-05-04 DIAGNOSIS — R109 Unspecified abdominal pain: Secondary | ICD-10-CM

## 2022-05-04 DIAGNOSIS — K561 Intussusception: Secondary | ICD-10-CM

## 2022-05-08 ENCOUNTER — Encounter: Payer: Self-pay | Admitting: Family Medicine

## 2022-05-08 DIAGNOSIS — J329 Chronic sinusitis, unspecified: Secondary | ICD-10-CM

## 2022-05-09 ENCOUNTER — Telehealth (INDEPENDENT_AMBULATORY_CARE_PROVIDER_SITE_OTHER): Payer: Medicaid Other | Admitting: Family Medicine

## 2022-05-09 ENCOUNTER — Encounter: Payer: Self-pay | Admitting: Family Medicine

## 2022-05-09 DIAGNOSIS — F319 Bipolar disorder, unspecified: Secondary | ICD-10-CM

## 2022-05-09 DIAGNOSIS — Z5181 Encounter for therapeutic drug level monitoring: Secondary | ICD-10-CM

## 2022-05-09 MED ORDER — CARIPRAZINE HCL 1.5 MG PO CAPS: 1.5000 mg | ORAL_CAPSULE | Freq: Every day | ORAL | 0 refills | Status: DC

## 2022-05-09 NOTE — Progress Notes (Signed)
Virtual Video Visit via MyChart Note  I connected with  Patricia Mcmahon on 05/09/22 at 11:00 AM EST by the video enabled telemedicine application for MyChart, and verified that I am speaking with the correct person using two identifiers.   I introduced myself as a Designer, jewellery with the practice. We discussed the limitations of evaluation and management by telemedicine and the availability of in person appointments. The patient expressed understanding and agreed to proceed.  Participating parties in this visit include: The patient and the nurse practitioner listed.  The patient is: At home I am: In the office - [facility]  Subjective:    CC: mood f/u, medication check   HPI: Patricia Mcmahon is a 37 y.o. year old female presenting today via [place] today for mood f/u.  Mood follow-up: - Diagnosis: Bipolar depression - Treatment: Vraylar 1.5 mg daily (started 3 weeks ago) - Medication side effects: none - SI/HI: none - Update: reports she is feeling good. She is making some progress, feeling like mood is getting more stable. She was able to get an appointment scheduled with [facility] on 06/05/21        05/09/2022   11:05 AM 04/19/2022    3:53 PM  PHQ9 SCORE ONLY  PHQ-9 Total Score 4 20      05/09/2022   11:06 AM 04/19/2022    3:54 PM  GAD 7 : Generalized Anxiety Score  Nervous, Anxious, on Edge 0 2  Control/stop worrying 0 3  Worry too much - different things 0 3  Trouble relaxing 0 3  Restless 0 3  Easily annoyed or irritable 0 2  Afraid - awful might happen 0 0  Total GAD 7 Score 0 16  Anxiety Difficulty Somewhat difficult Extremely difficult        Past medical history, Surgical history, Family history not pertinant except as noted below, Social history, Allergies, and medications have been entered into the medical record, reviewed, and corrections made.   Review of Systems:  All review of systems negative except what is listed in the  HPI   Objective:    General:  Speaking clearly in complete sentences. Absent shortness of breath noted.   Alert and oriented x3.   Normal judgment.  Absent acute distress.   Impression and Recommendations:    1. Bipolar depression ([place]) - cariprazine (VRAYLAR) 1.5 MG capsule; Take 1 capsule (1.5 mg total) by mouth daily.  Dispense: 30 capsule; Refill: 0  2. Medication monitoring encounter - cariprazine (VRAYLAR) 1.5 MG capsule; Take 1 capsule (1.5 mg total) by mouth daily.  Dispense: 30 capsule; Refill: 0  Patricia Mcmahon is doing significantly better since starting the Vraylar and has not had any adverse effects. She is scheduled to see [place] in a few weeks so they can take over medication management. No SI/HI. One more refill provided to give her enough to last until [facility] appointment.  Patient aware of signs/symptoms requiring further/urgent evaluation.    Follow-up if symptoms worsen or fail to improve.    I discussed the assessment and treatment plan with the patient. The patient was provided an opportunity to ask questions and all were answered. The patient agreed with the plan and demonstrated an understanding of the instructions.   The patient was advised to call back or seek an in-person evaluation if the symptoms worsen or if the condition fails to improve as anticipated.    Terrilyn Saver, NP

## 2022-05-10 ENCOUNTER — Telehealth: Payer: Medicaid Other | Admitting: Family Medicine

## 2022-05-22 ENCOUNTER — Telehealth: Payer: Self-pay | Admitting: Family Medicine

## 2022-05-22 NOTE — Telephone Encounter (Signed)
Patient called to advise that she has a UTI and has them frequently. Patient was advised she would probably need to come in to see [employer] [employer] but patient advised that she just started a new job and missed a day last week for jury duty so didn't want to miss more time if she could help it. Patient said she gets them frequently and was seen in ED a few months back for it and wanted to know if she would be willing to call in something this time. Please send mychart message or call to advise. Patient uses Insurance claims handler on [address]

## 2022-05-22 NOTE — Telephone Encounter (Signed)
Pt scheduled for tmr at 7:30. Orders still needing to be placed. Shaakira aware.

## 2022-05-23 ENCOUNTER — Encounter: Payer: Self-pay | Admitting: Family Medicine

## 2022-05-23 ENCOUNTER — Other Ambulatory Visit: Payer: Medicaid Other

## 2022-05-23 ENCOUNTER — Other Ambulatory Visit: Payer: Self-pay

## 2022-05-23 DIAGNOSIS — R300 Dysuria: Secondary | ICD-10-CM

## 2022-05-23 DIAGNOSIS — R350 Frequency of micturition: Secondary | ICD-10-CM | POA: Diagnosis not present

## 2022-05-24 LAB — URINE CULTURE
MICRO NUMBER:: 14525853
SPECIMEN QUALITY:: ADEQUATE

## 2022-06-01 ENCOUNTER — Encounter: Payer: Medicaid Other | Admitting: Family Medicine

## 2022-06-02 ENCOUNTER — Other Ambulatory Visit: Payer: Medicaid Other

## 2022-06-05 ENCOUNTER — Ambulatory Visit (HOSPITAL_COMMUNITY): Payer: Medicaid Other | Admitting: Psychiatry

## 2022-06-05 ENCOUNTER — Encounter (HOSPITAL_COMMUNITY): Payer: Self-pay

## 2022-06-05 ENCOUNTER — Ambulatory Visit (INDEPENDENT_AMBULATORY_CARE_PROVIDER_SITE_OTHER): Payer: Medicaid Other | Admitting: Clinical

## 2022-06-05 DIAGNOSIS — F319 Bipolar disorder, unspecified: Secondary | ICD-10-CM | POA: Diagnosis not present

## 2022-06-06 ENCOUNTER — Other Ambulatory Visit: Payer: Medicaid Other
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
## 2022-06-15 ENCOUNTER — Other Ambulatory Visit: Payer: Self-pay | Admitting: Family Medicine

## 2022-06-15 DIAGNOSIS — Z5181 Encounter for therapeutic drug level monitoring: Secondary | ICD-10-CM

## 2022-06-15 DIAGNOSIS — F319 Bipolar disorder, unspecified: Secondary | ICD-10-CM

## 2022-06-16 ENCOUNTER — Ambulatory Visit
Admission: RE | Admit: 2022-06-16 | Discharge: 2022-06-16 | Disposition: A | Discharge status: Home / Self Care | Payer: Medicaid Other | Source: Ambulatory Visit | Attending: Surgery | Admitting: Surgery

## 2022-06-16 ENCOUNTER — Other Ambulatory Visit: Payer: Medicaid Other

## 2022-06-16 DIAGNOSIS — Z9884 Bariatric surgery status: Secondary | ICD-10-CM | POA: Diagnosis not present

## 2022-06-16 DIAGNOSIS — K561 Intussusception: Secondary | ICD-10-CM

## 2022-06-16 DIAGNOSIS — R109 Unspecified abdominal pain: Secondary | ICD-10-CM

## 2022-06-16 DIAGNOSIS — Z903 Acquired absence of stomach [part of]: Secondary | ICD-10-CM | POA: Diagnosis not present

## 2022-06-16 DIAGNOSIS — K8020 Calculus of gallbladder without cholecystitis without obstruction: Secondary | ICD-10-CM | POA: Diagnosis not present

## 2022-06-16 DIAGNOSIS — I862 Pelvic varices: Secondary | ICD-10-CM | POA: Diagnosis not present

## 2022-06-16 MED ORDER — IOPAMIDOL (ISOVUE-300) INJECTION 61%
100.0000 mL | Freq: Once | INTRAVENOUS | Status: AC | PRN
  Administered 2022-06-16: 100 mL via INTRAVENOUS

## 2022-06-23 ENCOUNTER — Ambulatory Visit (HOSPITAL_COMMUNITY): Payer: Medicaid Other | Admitting: Psychiatry

## 2022-06-26 ENCOUNTER — Ambulatory Visit (INDEPENDENT_AMBULATORY_CARE_PROVIDER_SITE_OTHER): Payer: Medicaid Other | Admitting: Family Medicine

## 2022-06-26 ENCOUNTER — Other Ambulatory Visit (HOSPITAL_BASED_OUTPATIENT_CLINIC_OR_DEPARTMENT_OTHER): Payer: Self-pay

## 2022-06-26 ENCOUNTER — Telehealth: Payer: Self-pay | Admitting: Surgery

## 2022-06-26 ENCOUNTER — Encounter: Payer: Self-pay | Admitting: Family Medicine

## 2022-06-26 ENCOUNTER — Telehealth (HOSPITAL_COMMUNITY): Payer: Self-pay | Admitting: Psychiatry

## 2022-06-26 VITALS — BP 117/75 | HR 88 | Resp 18 | Ht 69.0 in | Wt 165.6 lb

## 2022-06-26 DIAGNOSIS — Z5181 Encounter for therapeutic drug level monitoring: Secondary | ICD-10-CM | POA: Diagnosis not present

## 2022-06-26 DIAGNOSIS — J0191 Acute recurrent sinusitis, unspecified: Secondary | ICD-10-CM | POA: Insufficient documentation

## 2022-06-26 DIAGNOSIS — Z0001 Encounter for general adult medical examination with abnormal findings: Secondary | ICD-10-CM

## 2022-06-26 DIAGNOSIS — F319 Bipolar disorder, unspecified: Secondary | ICD-10-CM

## 2022-06-26 DIAGNOSIS — R350 Frequency of micturition: Secondary | ICD-10-CM | POA: Diagnosis not present

## 2022-06-26 LAB — POC URINALSYSI DIPSTICK (AUTOMATED)
Glucose, UA: NEGATIVE
Nitrite, UA: NEGATIVE
Protein, UA: POSITIVE — AB
Spec Grav, UA: >=1.03 — AB (ref 1.010–1.025)
Urobilinogen, UA: 0.2 E.U./dL
pH, UA: 6 (ref 5.0–8.0)

## 2022-06-26 MED ORDER — CARIPRAZINE HCL 1.5 MG PO CAPS
1.5000 mg | ORAL_CAPSULE | Freq: Every day | ORAL | 0 refills | Status: DC
  Filled 2022-06-26: qty 30, 30d supply, fill #0

## 2022-06-26 MED ORDER — PHENAZOPYRIDINE HCL 200 MG PO TABS
200.0000 mg | ORAL_TABLET | Freq: Three times a day (TID) | ORAL | 0 refills | Status: DC | PRN
  Filled 2022-06-26: qty 10, 4d supply, fill #0

## 2022-06-26 NOTE — Telephone Encounter (Signed)
     Telephone call to patient with results of CT enterography.  No sign of intussusception or cause for intussusception (leading edge).  No recommendation for surgical intervention at this time.  If symptoms recur, the best option is to come to ER for immediate CT scan and try to catch the intussusception while it is occurring.  If patient would like to follow up with one of our bariatric surgeons, I would have her contact the office for an appointment.  Armandina Gemma, MD [facility] [place] [facility] Office: [phone]

## 2022-06-26 NOTE — Progress Notes (Signed)
Complete physical exam  Patient: Patricia Mcmahon   DOB: Apr 19, 1985   37 y.o. Female  MRN: [place]:[id number]  Subjective:    Chief Complaint  Patient presents with   Annual Exam    Possible UTI     Patricia Mcmahon is a 37 y.o. female who presents today for a complete physical exam. She reports consuming a general diet. The patient does not participate in regular exercise at present. She generally feels fairly well. She reports sleeping fairly well. She does have additional problems to discuss today.   States that her [facility] appointment was changed last minute to in-person and she wasn't able to take off work. She has tried calling back has been unable to get anyone to speak to her. She is asking for one more refill   UTI symptoms - she has a had a few days of urgency, frequency, suprapubic discomfort. No hematuria, fevers, chills, vaginal symptoms.    Most recent fall risk assessment:     06/26/2022    2:58 PM  [place] in the past year? 0  Number falls in past yr: 0  Injury with Fall? 0  Risk for fall due to : No Fall Risks  Follow up Falls evaluation completed     Most recent depression screenings:    06/26/2022    3:29 PM 06/05/2022    2:13 PM  PHQ 2/9 Scores  PHQ - 2 Score 4   PHQ- 9 Score 18   Exception Documentation       Information is confidential and restricted. Go to Review Flowsheets to unlock data.    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   Left knee pain 12/16/2012   Allergies  Allergen Reactions   Morphine And Related Other (See Comments)    Behavior changes - becomes hostile/violent      Patient Care Team: Terrilyn Saver, NP as PCP - General (Family Medicine) Nicola Girt, DO as PCP - Internal Medicine   Outpatient Medications Prior to Visit   Medication Sig   levonorgestrel (MIRENA) 20 MCG/24HR IUD 1 each by Intrauterine route once. Implanted 1st part of the year 2020   [DISCONTINUED] VRAYLAR 1.5 MG capsule Take 1 capsule by mouth once daily   [DISCONTINUED] cephALEXin (KEFLEX) 500 MG capsule Take 1 capsule (500 mg total) by mouth 2 (two) times daily.   [DISCONTINUED] ciprofloxacin (CIPRO) 500 MG tablet Take 1 tablet (500 mg total) by mouth every 12 (twelve) hours.   [DISCONTINUED] ondansetron (ZOFRAN) 4 MG tablet Take 1 tablet (4 mg total) by mouth every 8 (eight) hours as needed for nausea or vomiting.   [DISCONTINUED] ondansetron (ZOFRAN-ODT) 8 MG disintegrating tablet Take 1 tablet (8 mg total) by mouth every 8 (eight) hours as needed for nausea or vomiting. '8mg'$  ODT q4 hours prn nausea   No facility-administered medications prior to visit.    ROS All review of systems negative except what is listed in the HPI        Objective:     BP 117/75 (BP Location: Right Arm, Patient Position: Sitting, Cuff Size: Normal)   Pulse 88   Resp 18   Ht '5\' 9"'$  (1.753 m)   Wt 165 lb 9.6 oz (75.1 kg)   SpO2 100%  BMI 24.45 kg/m    Physical Exam Vitals reviewed.  Constitutional:      General: She is not in acute distress.    Appearance: Normal appearance. She is not ill-appearing.  HENT:     Head: Normocephalic and atraumatic.     Right Ear: Tympanic membrane normal.     Left Ear: Tympanic membrane normal.     Nose: Nose normal.     Mouth/Throat:     Mouth: Mucous membranes are moist.     Pharynx: Oropharynx is clear.  Eyes:     Extraocular Movements: Extraocular movements intact.     Conjunctiva/sclera: Conjunctivae normal.     Pupils: Pupils are equal, round, and reactive to light.  Neck:     Vascular: No carotid bruit.  Cardiovascular:     Rate and Rhythm: Normal rate and regular rhythm.     Pulses: Normal pulses.     Heart sounds: Normal heart sounds.  Pulmonary:     Effort: Pulmonary effort is normal.      Breath sounds: Normal breath sounds.  Abdominal:     General: Abdomen is flat. Bowel sounds are normal. There is no distension.     Palpations: Abdomen is soft. There is no mass.     Tenderness: There is no abdominal tenderness. There is no right CVA tenderness, left CVA tenderness, guarding or rebound.  Genitourinary:    Comments: Deferred exam Musculoskeletal:        General: Normal range of motion.     Cervical back: Normal range of motion and neck supple. No tenderness.     Right lower leg: No edema.     Left lower leg: No edema.  Lymphadenopathy:     Cervical: No cervical adenopathy.  Skin:    General: Skin is warm and dry.     Capillary Refill: Capillary refill takes less than 2 seconds.  Neurological:     General: No focal deficit present.     Mental Status: She is alert and oriented to person, place, and time. Mental status is at baseline.  Psychiatric:        Mood and Affect: Mood normal.        Behavior: Behavior normal.        Thought Content: Thought content normal.        Judgment: Judgment normal.      Results for orders placed or performed in visit on 06/26/22  POCT Urinalysis Dipstick (Automated)  Result Value Ref Range   Color, UA Yellow    Clarity, UA Cloudy    Glucose, UA Negative Negative   Bilirubin, UA Large    Ketones, UA Trace    Spec Grav, UA >=1.030 (A) 1.010 - 1.025   Blood, UA Trace    pH, UA 6.0 5.0 - 8.0   Protein, UA Positive (A) Negative   Urobilinogen, UA 0.2 0.2 or 1.0 E.U./dL   Nitrite, UA Negative    Leukocytes, UA Moderate (2+) (A) Negative       Assessment & Plan:    Routine Health Maintenance and Physical Exam  Immunization History  Administered Date(s) Administered   Tdap 03/31/2018    Health Maintenance  Topic Date Due   COVID-19 Vaccine (1) Never done   PAP SMEAR-Modifier  Never done   INFLUENZA VACCINE  07/16/2022 (Originally 11/15/2021)   DTaP/Tdap/Td (2 - Td or Tdap) 03/31/2028   Hepatitis C Screening  Completed    HIV Screening  Completed   HPV VACCINES  Aged Out  Discussed health benefits of physical activity, and encouraged her to engage in regular exercise appropriate for her age and condition.  Problem List Items Addressed This Visit   None Visit Diagnoses     Encounter for routine adult health examination with abnormal findings    -  Primary   Bipolar depression ([place])     One refill provided. She needs to establish with [facility] for Bipolar management.  No SI/HI. Discussed safety.    Relevant Medications   cariprazine (VRAYLAR) 1.5 MG capsule   Medication monitoring encounter       Relevant Medications   cariprazine (VRAYLAR) 1.5 MG capsule   Urinary frequency     UA with moderate leuks, bili, ketones, protein, trace blood. Sending for culture and microscopic eval. Adding pyridium. ABX pending culture or if symptoms worsen, can start empirically. Likely need to recheck urine in a few weeks, but we will see what microscopy shows.  Discussed adequate hydration and diet.  Patient aware of signs/symptoms requiring further/urgent evaluation.    Relevant Medications   phenazopyridine (PYRIDIUM) 200 MG tablet   Other Relevant Orders   Urine Microscopic Only   Urine Culture      Return in about 1 year (around 06/26/2023) for physical.     Terrilyn Saver, NP

## 2022-06-26 NOTE — Patient Instructions (Signed)
Urine sample with some leukocytes. Will send for culture and give you some pyridium. If symptoms significantly worse before culture results, please let me know and we can start empiric antibiotics. Increase water intake. Avoid bladder irritants.

## 2022-06-27 LAB — URINE CULTURE
MICRO NUMBER:: 14675265
SPECIMEN QUALITY:: ADEQUATE

## 2022-06-27 LAB — URINALYSIS, MICROSCOPIC ONLY

## 2022-07-04 ENCOUNTER — Encounter: Payer: Self-pay | Admitting: Family Medicine

## 2022-07-04 DIAGNOSIS — B351 Tinea unguium: Secondary | ICD-10-CM

## 2022-07-13 ENCOUNTER — Ambulatory Visit: Payer: Medicaid Other | Admitting: Podiatry

## 2022-07-27 ENCOUNTER — Ambulatory Visit: Payer: Medicaid Other | Admitting: Podiatry

## 2022-07-28 ENCOUNTER — Encounter (HOSPITAL_COMMUNITY): Payer: Self-pay | Admitting: Psychiatry

## 2022-07-28 ENCOUNTER — Ambulatory Visit (INDEPENDENT_AMBULATORY_CARE_PROVIDER_SITE_OTHER): Payer: Medicaid Other | Admitting: Psychiatry

## 2022-07-28 VITALS — BP 112/85 | HR 106

## 2022-07-28 DIAGNOSIS — F3162 Bipolar disorder, current episode mixed, moderate: Secondary | ICD-10-CM | POA: Diagnosis not present

## 2022-07-28 DIAGNOSIS — J0191 Acute recurrent sinusitis, unspecified: Secondary | ICD-10-CM | POA: Diagnosis not present

## 2022-07-28 DIAGNOSIS — N938 Other specified abnormal uterine and vaginal bleeding: Secondary | ICD-10-CM | POA: Diagnosis not present

## 2022-07-28 MED ORDER — HYDROXYZINE HCL 25 MG PO TABS: 25.0000 mg | ORAL_TABLET | Freq: Three times a day (TID) | ORAL | 1 refills | Status: DC | PRN

## 2022-07-28 MED ORDER — LAMOTRIGINE 25 MG PO TABS: ORAL_TABLET | ORAL | 0 refills | Status: DC
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
## 2022-08-10 ENCOUNTER — Ambulatory Visit: Payer: Medicaid Other | Admitting: Podiatry

## 2022-08-10 DIAGNOSIS — J301 Allergic rhinitis due to pollen: Secondary | ICD-10-CM | POA: Diagnosis not present

## 2022-08-17 ENCOUNTER — Ambulatory Visit (HOSPITAL_COMMUNITY): Payer: Medicaid Other | Admitting: Clinical

## 2022-08-24 ENCOUNTER — Encounter (HOSPITAL_COMMUNITY): Payer: Self-pay | Admitting: Psychiatry

## 2022-08-24 ENCOUNTER — Ambulatory Visit (INDEPENDENT_AMBULATORY_CARE_PROVIDER_SITE_OTHER): Payer: Medicaid Other | Admitting: Psychiatry

## 2022-08-24 DIAGNOSIS — F3162 Bipolar disorder, current episode mixed, moderate: Secondary | ICD-10-CM | POA: Diagnosis not present

## 2022-08-24 MED ORDER — LAMOTRIGINE 25 MG PO TABS
75.0000 mg | ORAL_TABLET | Freq: Every day | ORAL | 0 refills | Status: DC
Start: 2022-09-19 — End: 2022-08-24

## 2022-08-24 MED ORDER — LAMOTRIGINE 25 MG PO TABS
75.0000 mg | ORAL_TABLET | Freq: Every day | ORAL | 0 refills | Status: DC
Start: 2022-09-19 — End: 2022-11-02
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
## 2022-10-04 ENCOUNTER — Ambulatory Visit: Payer: Medicaid Other | Admitting: Family Medicine

## 2022-10-04 ENCOUNTER — Encounter: Payer: Self-pay | Admitting: Family Medicine

## 2022-10-04 ENCOUNTER — Other Ambulatory Visit (HOSPITAL_BASED_OUTPATIENT_CLINIC_OR_DEPARTMENT_OTHER): Payer: Self-pay

## 2022-10-04 VITALS — BP 108/65 | HR 83 | Temp 97.3°F | Ht 69.0 in | Wt 161.0 lb

## 2022-10-04 DIAGNOSIS — R350 Frequency of micturition: Secondary | ICD-10-CM | POA: Diagnosis not present

## 2022-10-04 DIAGNOSIS — R3129 Other microscopic hematuria: Secondary | ICD-10-CM

## 2022-10-04 DIAGNOSIS — R300 Dysuria: Secondary | ICD-10-CM

## 2022-10-04 LAB — POC URINALSYSI DIPSTICK (AUTOMATED)
Bilirubin, UA: NEGATIVE
Glucose, UA: NEGATIVE
Ketones, UA: NEGATIVE
Nitrite, UA: NEGATIVE
Protein, UA: POSITIVE — AB
Spec Grav, UA: 1.02 (ref 1.010–1.025)
Urobilinogen, UA: 0.2 E.U./dL
pH, UA: 6 (ref 5.0–8.0)

## 2022-10-04 MED ORDER — CEPHALEXIN 500 MG PO CAPS
500.0000 mg | ORAL_CAPSULE | Freq: Two times a day (BID) | ORAL | 0 refills | Status: AC
Start: 2022-10-04 — End: 2022-10-11
  Filled 2022-10-04: qty 14, 7d supply, fill #0

## 2022-10-04 MED ORDER — PHENAZOPYRIDINE HCL 200 MG PO TABS
200.0000 mg | ORAL_TABLET | Freq: Three times a day (TID) | ORAL | 0 refills | Status: DC | PRN
Start: 2022-10-04 — End: 2022-11-23
  Filled 2022-10-04: qty 10, 4d supply, fill #0

## 2022-10-04 NOTE — Patient Instructions (Signed)
Increase fluid intake to a minimum of 8, 8 ounce glasses of water per day to help flush the kidneys and bladder.   The medication Pyridium may help with symptoms of bladder spasms, abdominal pain, and burning. This medication will turn your urine bright orange. This is expected and nothing to be concerned about. I have prescribed this for you in addition to the antibiotic.   If you begin to have worsening symptoms, low back pain, fevers, chills, nausea, vomiting, diarrhea, or decreased urine, please let us know.  Please finish all of your antibiotic even if you begin to feel better before you have completed the full course. It takes the full dose to be completely effective against the bacteria present. Stopping the antibiotic early can lead to the bacteria becoming resistant to the antibiotic and puts you at risk of the antibiotic not working for you in the future.  If we have to change the antibiotics based on the culture, we will let you know.

## 2022-10-04 NOTE — Progress Notes (Signed)
Acute Office Visit  Subjective:     Patient ID: Patricia Mcmahon, female    DOB: [birth year], 37 y.o.   MRN: [id number]  Chief Complaint  Patient presents with   Dysuria     Patient is in today for UTI symptoms.   Discussed the use of AI scribe software for clinical note transcription with the patient, who gave verbal consent to proceed.  History of Present Illness   The patient presents with urinary symptoms that started a couple of days ago. They describe a constant urge to urinate and a burning sensation during urination. They deny hematuria but report feeling feverish. They have a history of resistant urinary tract infections (UTIs) that have required treatment in the emergency department. The last resistant UTI was treated with Keflex in August after an initial treatment with Bactrim at an urgent care center was ineffective. They deny any recent antibiotic use or allergies to antibiotics. They also deny any associated abdominal pain, flank pain, or low back pain. They have no associated vaginal symptoms.          All review of systems negative except what is listed in the HPI      Objective:    BP 108/65   Pulse 83   Temp (!) 97.3 F (36.3 C) (Oral)   Ht 5\' 9"  (1.753 m)   Wt 161 lb (73 kg)   SpO2 99%   BMI 23.78 kg/m    Physical Exam Vitals reviewed.  Constitutional:      Appearance: Normal appearance.  Cardiovascular:     Rate and Rhythm: Normal rate and regular rhythm.     Pulses: Normal pulses.     Heart sounds: Normal heart sounds.  Pulmonary:     Effort: Pulmonary effort is normal.     Breath sounds: Normal breath sounds.  Abdominal:     General: There is no distension.     Palpations: There is no mass.     Tenderness: There is no abdominal tenderness. There is no right CVA tenderness, left CVA tenderness or guarding.     Hernia: No hernia is present.  Skin:    General: Skin is warm and dry.  Neurological:     Mental Status: She is alert and  oriented to person, place, and time.  Psychiatric:        Mood and Affect: Mood normal.        Behavior: Behavior normal.        Thought Content: Thought content normal.        Judgment: Judgment normal.         Results for orders placed or performed in visit on 10/04/22  POCT Urinalysis Dipstick (Automated)  Result Value Ref Range   Color, UA yellow    Clarity, UA clear    Glucose, UA Negative Negative   Bilirubin, UA negative    Ketones, UA negative    Spec Grav, UA 1.020 1.010 - 1.025   Blood, UA moderate    pH, UA 6.0 5.0 - 8.0   Protein, UA Positive (A) Negative   Urobilinogen, UA 0.2 0.2 or 1.0 E.U./dL   Nitrite, UA negative    Leukocytes, UA Large (3+) (A) Negative        Assessment & Plan:   Problem List Items Addressed This Visit   None Visit Diagnoses     Dysuria    -  Primary   Relevant Medications   cephALEXin (KEFLEX) 500 MG capsule   phenazopyridine (  PYRIDIUM) 200 MG tablet   Other Relevant Orders   POCT Urinalysis Dipstick (Automated) (Completed)   Urine Culture   Urine Microscopic   Frequency of urination       Relevant Medications   cephALEXin (KEFLEX) 500 MG capsule   phenazopyridine (PYRIDIUM) 200 MG tablet   Other Relevant Orders   POCT Urinalysis Dipstick (Automated) (Completed)   Urine Culture   Urine Microscopic         Urinary Tract Infection (UTI): Dysuria and urinary frequency for a couple of days. No flank pain, abdominal pain, or vaginal symptoms. Moderate leukocyte esterase on urinalysis. History of resistant UTI treated with Keflex. -Start Keflex. -Send urine culture to lab. -Return in two weeks after symptoms resolve and antibiotics are finished to ensure resolution of hematuria. -Provide Pyridium for symptom relief. -Advise to drink plenty of water.        Meds ordered this encounter  Medications   cephALEXin (KEFLEX) 500 MG capsule    Sig: Take 1 capsule (500 mg total) by mouth 2 (two) times daily for 7 days.     Dispense:  14 capsule    Refill:  0    Order Specific Question:   Supervising Provider    Answer:   Danise Edge A [[id number]]   phenazopyridine (PYRIDIUM) 200 MG tablet    Sig: Take 1 tablet (200 mg total) by mouth 3 (three) times daily as needed for pain.    Dispense:  10 tablet    Refill:  0    Order Specific Question:   Supervising Provider    Answer:   Danise Edge A [[id number]]    Return for repeat UA in 3-4 weeks to monitor hematuria.  Clayborne Dana, NP

## 2022-10-05 LAB — URINALYSIS, MICROSCOPIC ONLY

## 2022-10-05 NOTE — Addendum Note (Signed)
Addended by: Hyman Hopes B on: 10/05/2022 11:56 AM   Modules accepted: Orders

## 2022-10-06 LAB — URINE CULTURE
MICRO NUMBER:: 15102082
SPECIMEN QUALITY:: ADEQUATE

## 2022-10-15 ENCOUNTER — Encounter: Payer: Self-pay | Admitting: Family Medicine

## 2022-10-15 DIAGNOSIS — R300 Dysuria: Secondary | ICD-10-CM

## 2022-10-16 ENCOUNTER — Other Ambulatory Visit (HOSPITAL_BASED_OUTPATIENT_CLINIC_OR_DEPARTMENT_OTHER): Payer: Self-pay

## 2022-10-16 MED ORDER — NITROFURANTOIN MONOHYD MACRO 100 MG PO CAPS
100.0000 mg | ORAL_CAPSULE | Freq: Two times a day (BID) | ORAL | 0 refills | Status: AC
Start: 2022-10-16 — End: 2022-10-21
  Filled 2022-10-16: qty 10, 5d supply, fill #0

## 2022-10-16 MED ORDER — NITROFURANTOIN MONOHYD MACRO 100 MG PO CAPS
100.0000 mg | ORAL_CAPSULE | Freq: Two times a day (BID) | ORAL | 0 refills | Status: DC
Start: 2022-10-16 — End: 2022-10-16

## 2022-10-16 NOTE — Addendum Note (Signed)
Addended by: Hyman Hopes B on: 10/16/2022 02:46 PM   Modules accepted: Orders

## 2022-11-02 ENCOUNTER — Ambulatory Visit (INDEPENDENT_AMBULATORY_CARE_PROVIDER_SITE_OTHER): Payer: Medicaid Other | Admitting: Student

## 2022-11-02 ENCOUNTER — Encounter (HOSPITAL_COMMUNITY): Payer: Self-pay | Admitting: Student

## 2022-11-02 DIAGNOSIS — F17200 Nicotine dependence, unspecified, uncomplicated: Secondary | ICD-10-CM | POA: Diagnosis not present

## 2022-11-02 DIAGNOSIS — F3162 Bipolar disorder, current episode mixed, moderate: Secondary | ICD-10-CM

## 2022-11-02 DIAGNOSIS — F411 Generalized anxiety disorder: Secondary | ICD-10-CM | POA: Diagnosis not present

## 2022-11-02 DIAGNOSIS — Z903 Acquired absence of stomach [part of]: Secondary | ICD-10-CM | POA: Insufficient documentation

## 2022-11-02 HISTORY — DX: Acquired absence of stomach (part of): Z90.3

## 2022-11-02 MED ORDER — GABAPENTIN 100 MG PO CAPS
100.0000 mg | ORAL_CAPSULE | Freq: Two times a day (BID) | ORAL | 0 refills | Status: DC
Start: 2022-11-02 — End: 2022-12-14

## 2022-11-02 MED ORDER — LAMOTRIGINE 100 MG PO TABS
100.0000 mg | ORAL_TABLET | Freq: Every day | ORAL | 0 refills | Status: DC
Start: 2022-11-02 — End: 2022-12-14

## 2022-11-02 NOTE — Patient Instructions (Signed)
See PCP for fatigue & nutrition labs: TSH, FT4, B12, folate, Vit D
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
## 2022-11-03 ENCOUNTER — Other Ambulatory Visit: Payer: Self-pay | Admitting: Family Medicine

## 2022-11-03 DIAGNOSIS — R5383 Other fatigue: Secondary | ICD-10-CM

## 2022-11-03 NOTE — Progress Notes (Signed)
Ordering labs per message from Dr. Princess Bruins:  Hello! I'm your patient's psychiatrist and am wondering if she could stop by your office to get some labs to rule out thyroid disease and vitamin deficiencies as causes for her fatigue and low mood. I was thinking about TSH, FT4, B12, folate, vitamin D. Also just to keep you updated, for her i've increased her lamictal to 100 mg daily for bipolar 1 disorder and started her on gabapentin 100 mg BID for anxiety and mood stability

## 2022-11-16 ENCOUNTER — Ambulatory Visit: Payer: Medicaid Other | Admitting: Family Medicine

## 2022-11-23 ENCOUNTER — Telehealth: Payer: Medicaid Other | Admitting: Family Medicine

## 2022-11-23 VITALS — Ht 69.0 in | Wt 161.0 lb

## 2022-11-24 ENCOUNTER — Other Ambulatory Visit: Payer: Medicaid Other

## 2022-11-24 NOTE — Progress Notes (Signed)
Appointment cancelled. Needed lab appointment only.

## 2022-11-28 ENCOUNTER — Other Ambulatory Visit: Payer: Medicaid Other

## 2022-11-30 ENCOUNTER — Other Ambulatory Visit (INDEPENDENT_AMBULATORY_CARE_PROVIDER_SITE_OTHER): Payer: Medicaid Other

## 2022-11-30 ENCOUNTER — Other Ambulatory Visit: Payer: Self-pay | Admitting: Family Medicine

## 2022-11-30 DIAGNOSIS — R5383 Other fatigue: Secondary | ICD-10-CM

## 2022-11-30 DIAGNOSIS — E538 Deficiency of other specified B group vitamins: Secondary | ICD-10-CM

## 2022-11-30 DIAGNOSIS — R3129 Other microscopic hematuria: Secondary | ICD-10-CM

## 2022-11-30 DIAGNOSIS — E559 Vitamin D deficiency, unspecified: Secondary | ICD-10-CM

## 2022-11-30 LAB — URINALYSIS, ROUTINE W REFLEX MICROSCOPIC
Bilirubin Urine: NEGATIVE
Hgb urine dipstick: NEGATIVE
Ketones, ur: NEGATIVE
Leukocytes,Ua: NEGATIVE
Nitrite: NEGATIVE
RBC / HPF: NONE SEEN (ref 0–?)
Specific Gravity, Urine: 1.02 (ref 1.000–1.030)
Total Protein, Urine: NEGATIVE
Urine Glucose: NEGATIVE
Urobilinogen, UA: 0.2 (ref 0.0–1.0)
pH: 7 (ref 5.0–8.0)

## 2022-12-01 DIAGNOSIS — E538 Deficiency of other specified B group vitamins: Secondary | ICD-10-CM | POA: Insufficient documentation

## 2022-12-01 DIAGNOSIS — E559 Vitamin D deficiency, unspecified: Secondary | ICD-10-CM | POA: Insufficient documentation

## 2022-12-01 LAB — B12 AND FOLATE PANEL
Folate: 12.1 ng/mL (ref >5.9)
Vitamin B-12: 204 pg/mL — ABNORMAL LOW (ref 211–911)

## 2022-12-01 LAB — TSH: TSH: 1.29 u[IU]/mL (ref 0.35–5.50)

## 2022-12-01 LAB — VITAMIN D 25 HYDROXY (VIT D DEFICIENCY, FRACTURES): VITD: 7.68 ng/mL — ABNORMAL LOW (ref 30.00–100.00)

## 2022-12-01 LAB — T4, FREE: Free T4: 0.94 ng/dL (ref 0.60–1.60)

## 2022-12-01 MED ORDER — VITAMIN D (ERGOCALCIFEROL) 1.25 MG (50000 UNIT) PO CAPS
50000.0000 [IU] | ORAL_CAPSULE | ORAL | 0 refills | Status: DC
Start: 2022-12-01 — End: 2023-05-17

## 2022-12-01 NOTE — Progress Notes (Signed)
Urine does not have blood any longer! Vitamin D is quite low - adding on a few extra labs to further investigate. Start high-dose weekly supplement, then switch to 1,000 units over-the-counter daily.  B12 is mildly low - start taking over-the-counter B12 1,000 mcg daily.  Follow-up in 3 months

## 2022-12-01 NOTE — Addendum Note (Signed)
Addended by: Hyman Hopes B on: 12/01/2022 12:33 PM   Modules accepted: Orders

## 2022-12-14 ENCOUNTER — Telehealth (INDEPENDENT_AMBULATORY_CARE_PROVIDER_SITE_OTHER): Payer: Medicaid Other | Admitting: Student

## 2022-12-14 DIAGNOSIS — F411 Generalized anxiety disorder: Secondary | ICD-10-CM | POA: Diagnosis not present

## 2022-12-14 DIAGNOSIS — F3162 Bipolar disorder, current episode mixed, moderate: Secondary | ICD-10-CM

## 2022-12-14 MED ORDER — GABAPENTIN 100 MG PO CAPS: 100.0000 mg | ORAL_CAPSULE | Freq: Two times a day (BID) | ORAL | 1 refills | Status: DC

## 2022-12-14 MED ORDER — LAMOTRIGINE 150 MG PO TABS: 150.0000 mg | ORAL_TABLET | Freq: Every day | ORAL | 1 refills | Status: DC

## 2022-12-14 MED ORDER — LAMOTRIGINE 100 MG PO TABS
100.0000 mg | ORAL_TABLET | Freq: Every day | ORAL | 1 refills | Status: DC
Start: 2022-12-14 — End: 2022-12-14
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
## 2022-12-17 ENCOUNTER — Encounter (HOSPITAL_COMMUNITY): Payer: Self-pay | Admitting: Student

## 2022-12-21 NOTE — Addendum Note (Signed)
Addended by: Theodoro Kos A on: 12/21/2022 02:48 PM   Modules accepted: Level of Service

## 2023-01-11 ENCOUNTER — Encounter (HOSPITAL_COMMUNITY): Payer: Medicaid Other | Admitting: Student

## 2023-01-17 ENCOUNTER — Ambulatory Visit: Payer: Medicaid Other | Admitting: Family Medicine

## 2023-01-18 ENCOUNTER — Ambulatory Visit: Payer: Medicaid Other | Admitting: Family Medicine

## 2023-01-18 ENCOUNTER — Encounter: Payer: Self-pay | Admitting: Family Medicine

## 2023-01-18 VITALS — BP 109/71 | HR 95 | Resp 18 | Ht 69.0 in | Wt 160.4 lb

## 2023-01-18 DIAGNOSIS — E538 Deficiency of other specified B group vitamins: Secondary | ICD-10-CM

## 2023-01-18 DIAGNOSIS — E559 Vitamin D deficiency, unspecified: Secondary | ICD-10-CM | POA: Diagnosis not present

## 2023-01-18 MED ORDER — VITAMIN B-12 1000 MCG PO TABS
1000.0000 ug | ORAL_TABLET | Freq: Every day | ORAL | 1 refills | Status: AC
Start: 2023-01-18 — End: ?

## 2023-01-18 MED ORDER — MULTI-VITAMIN/MINERALS PO TABS: 1.0000 | ORAL_TABLET | Freq: Every day | ORAL | 1 refills | Status: DC

## 2023-01-18 NOTE — Progress Notes (Signed)
   Established Patient Office Visit  Subjective   Patient ID: Patricia Mcmahon, female    DOB: 29-Dec-1985  Age: 37 y.o. MRN: [id number]  Chief Complaint  Patient presents with   Follow-up    Pt would like to discuss getting vitamins through her insurance     HPI  Discussed the use of AI scribe software for clinical note transcription with the patient, who gave verbal consent to proceed.  History of Present Illness   The patient, on a regimen of high-dose weekly Vitamin D and daily B12 and multivitamins, reports a noticeable, albeit not significant, improvement in energy levels. She has been compliant with the prescribed regimen, taking all medications as directed. She also inquires about the possibility of obtaining the B12 and multivitamin through prescription, as it would be more cost-effective for her. The patient is due to transition from weekly to daily Vitamin D intake and has one weekly pill left.              ROS All review of systems negative except what is listed in the HPI    Objective:     BP 109/71 (BP Location: Right Arm, Patient Position: Sitting, Cuff Size: Normal)   Pulse 95   Resp 18   Ht 5\' 9"  (1.753 m)   Wt 160 lb 6.4 oz (72.8 kg)   SpO2 99%   BMI 23.69 kg/m    Physical Exam Vitals reviewed.  Constitutional:      Appearance: Normal appearance.  Cardiovascular:     Rate and Rhythm: Normal rate and regular rhythm.     Heart sounds: Normal heart sounds.  Pulmonary:     Effort: Pulmonary effort is normal.     Breath sounds: Normal breath sounds.  Skin:    General: Skin is warm and dry.  Neurological:     Mental Status: She is alert and oriented to person, place, and time.  Psychiatric:        Mood and Affect: Mood normal.        Behavior: Behavior normal.        Thought Content: Thought content normal.        Judgment: Judgment normal.      No results found for any visits on 01/18/23.    The ASCVD Risk score (Arnett DK, et al., 2019)  failed to calculate for the following reasons:   The 2019 ASCVD risk score is only valid for ages 66 to 8    Assessment & Plan:   Problem List Items Addressed This Visit       Active Problems   Vitamin D deficiency - Primary   Relevant Medications   Multiple Vitamins-Minerals (MULTIVITAMIN WITH MINERALS) tablet   Other Relevant Orders   VITAMIN D 25 Hydroxy (Vit-D Deficiency, Fractures)   B12 deficiency   Relevant Medications   cyanocobalamin (VITAMIN B12) 1000 MCG tablet   Multiple Vitamins-Minerals (MULTIVITAMIN WITH MINERALS) tablet   Other Relevant Orders   B12 and Folate Panel       Vitamin D and B12 Deficiency Patient reports improved energy levels after starting supplementation. Currently on high dose Vitamin D once weekly and daily B12 and multivitamin. -Order labs today to recheck Vitamin D and B12 levels. -If levels are still low or low range of normal, consider increasing B12 dosage.    Return in about 3 months (around 04/20/2023) for routine follow-up.    Clayborne Dana, NP

## 2023-01-19 ENCOUNTER — Encounter (HOSPITAL_COMMUNITY): Payer: Medicaid Other | Admitting: Physician Assistant

## 2023-01-19 LAB — B12 AND FOLATE PANEL
Folate: >24.2 ng/mL (ref >5.9)
Vitamin B-12: 845 pg/mL (ref 211–911)

## 2023-01-19 LAB — VITAMIN D 25 HYDROXY (VIT D DEFICIENCY, FRACTURES): VITD: 35.09 ng/mL (ref 30.00–100.00)

## 2023-01-19 MED ORDER — VITAMIN D3 25 MCG (1000 UT) PO CAPS
1000.0000 [IU] | ORAL_CAPSULE | Freq: Every day | ORAL | 3 refills | Status: AC
Start: 2023-01-19 — End: ?

## 2023-01-19 NOTE — Addendum Note (Signed)
Addended by: Hyman Hopes B on: 01/19/2023 11:17 AM   Modules accepted: Orders

## 2023-01-22 ENCOUNTER — Ambulatory Visit: Payer: Medicaid Other | Admitting: Podiatry

## 2023-01-22 ENCOUNTER — Other Ambulatory Visit: Payer: Medicaid Other

## 2023-01-22 ENCOUNTER — Encounter: Payer: Self-pay | Admitting: Podiatry

## 2023-01-22 DIAGNOSIS — M21619 Bunion of unspecified foot: Secondary | ICD-10-CM

## 2023-01-22 DIAGNOSIS — B351 Tinea unguium: Secondary | ICD-10-CM

## 2023-01-23 ENCOUNTER — Other Ambulatory Visit (INDEPENDENT_AMBULATORY_CARE_PROVIDER_SITE_OTHER): Payer: Medicaid Other

## 2023-01-23 DIAGNOSIS — E559 Vitamin D deficiency, unspecified: Secondary | ICD-10-CM | POA: Diagnosis not present

## 2023-01-24 ENCOUNTER — Ambulatory Visit (INDEPENDENT_AMBULATORY_CARE_PROVIDER_SITE_OTHER): Payer: Medicaid Other | Admitting: Physician Assistant

## 2023-01-24 DIAGNOSIS — F411 Generalized anxiety disorder: Secondary | ICD-10-CM | POA: Diagnosis not present

## 2023-01-24 DIAGNOSIS — F3162 Bipolar disorder, current episode mixed, moderate: Secondary | ICD-10-CM

## 2023-01-24 LAB — COMPREHENSIVE METABOLIC PANEL
ALT: 9 U/L (ref 0–35)
AST: 10 U/L (ref 0–37)
Albumin: 3.9 g/dL (ref 3.5–5.2)
Alkaline Phosphatase: 56 U/L (ref 39–117)
BUN: 8 mg/dL (ref 6–23)
CO2: 28 meq/L (ref 19–32)
Calcium: 9.4 mg/dL (ref 8.4–10.5)
Chloride: 106 meq/L (ref 96–112)
Creatinine, Ser: 0.82 mg/dL (ref 0.40–1.20)
GFR: 90.16 mL/min (ref >60.00)
Glucose, Bld: 76 mg/dL (ref 70–99)
Potassium: 3.9 meq/L (ref 3.5–5.1)
Sodium: 141 meq/L (ref 135–145)
Total Bilirubin: 0.6 mg/dL (ref 0.2–1.2)
Total Protein: 5.4 g/dL — ABNORMAL LOW (ref 6.0–8.3)

## 2023-01-24 LAB — PHOSPHORUS: Phosphorus: 2.6 mg/dL (ref 2.3–4.6)

## 2023-01-24 LAB — PARATHYROID HORMONE, INTACT (NO CA): PTH: 65 pg/mL (ref 16–77)

## 2023-01-24 MED ORDER — LAMOTRIGINE 150 MG PO TABS: 150.0000 mg | ORAL_TABLET | Freq: Every day | ORAL | 1 refills | Status: DC

## 2023-01-24 MED ORDER — GABAPENTIN 100 MG PO CAPS: 100.0000 mg | ORAL_CAPSULE | Freq: Two times a day (BID) | ORAL | 1 refills | Status: DC

## 2023-01-24 MED ORDER — BUSPIRONE HCL 7.5 MG PO TABS
7.5000 mg | ORAL_TABLET | Freq: Two times a day (BID) | ORAL | 1 refills | Status: DC
Start: 2023-01-24 — End: 2023-03-07

## 2023-01-24 NOTE — Progress Notes (Signed)
BH MD/PA/NP OP Progress Note  01/25/2023 10:16 AM Patricia Mcmahon  MRN:  [id number]  Chief Complaint:  Chief Complaint  Patient presents with   Follow-up   Medication Management   HPI:   Patricia Mcmahon is a 37 year old female with a past psychiatric history significant for bipolar 1 disorder (mixed, moderate) and generalized anxiety disorder who presents to [facility] for follow-up and medication management.  Patient was last seen by Princess Bruins, MD on 12/14/2022.  Patient is currently being managed on the following psychiatric medications:  Lamictal 150 mg at bedtime Gabapentin 100 mg 2 times daily  Patient reports no issues or concerns regarding her current medication regimen.  Patient denies experiencing any adverse side effects at this time.  Although they endorse depression, they report that their depression has been manageable.  Patient rates her depression a 4 or 5 out of 10 with 10 being most severe.  They endorse depressive episodes 1-2 times per week.  Patient endorses the following depressive symptoms: difficulty getting out of bed, worthlessness, and lack of motivation.  They endorse anxiety that they rates as a 4 or 5 out of 10.  Patient denies any new stressors at this time.  A PHQ-9 screen was performed with the patient scoring a 7.  A GAD-7 screen was also performed with the patient scoring an 11.  Patient is alert and oriented x 4, calm, cooperative, and fully engaged in conversation during the encounter.  Patient endorses neutral mood.  Patient denies suicidal or homicidal ideations.  They further deny auditory or visual hallucinations and do not appear to be responding to internal/external stimuli.  Patient endorses irregular sleep and states that she can receive anywhere from 4 to 5 hours of sleep to 10 to 12 hours.  Patient endorses good appetite and eats on average 2-3 meals per day.  Patient endorses alcohol consumption on occasion  stating that she is a social drinker.  Patient endorses tobacco use and claims on average a pack per day.  Patient denies illicit drug use.  Visit Diagnosis:    ICD-10-CM   1. Bipolar 1 disorder, mixed, moderate (HCC)  F31.62 lamoTRIgine (LAMICTAL) 150 MG tablet    gabapentin (NEURONTIN) 100 MG capsule    2. 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  Head trauma: Denied Seizures: Denied DT: Denied Allergies: Morphine and codeine   Past Medical History:  Past Medical History:  Diagnosis Date   GERD (gastroesophageal reflux disease)    no current med.   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   Left knee pain 12/16/2012   Thrombocytopenia (HCC) 08/16/2011    Past Surgical History:  Procedure Laterality Date   CESAREAN SECTION     ESOPHAGOGASTRODUODENOSCOPY N/A 01/14/2021   Procedure: ESOPHAGOGASTRODUODENOSCOPY (EGD);  Surgeon: Willis Modena, MD;  Location: [facility];  Service: Endoscopy;  Laterality: N/A;   GASTRECTOMY     sleeve   KNEE ARTHROSCOPY  04/02/2012   Procedure: ARTHROSCOPY KNEE;  Surgeon: Velna Ochs, MD;  Location: [facility];  Service: Orthopedics;  Laterality: Left;  left knee arthroscopy chondroplasty   KNEE ARTHROSCOPY WITH LATERAL RELEASE Left 01/07/2013   Procedure: LEFT KNEE ARTHROSCOPY, CHONDROPLASTY AND  LATERAL RELEASE;  Surgeon: Velna Ochs, MD;  Location: [facility];  Service: Orthopedics;  Laterality: Left;    Family Psychiatric History:  Suicide: Paternal aunt completed suicide Homicide: Unsure Psych hospitalization: Denied BiPD: ?Dad SCZ/SCzA: ?Dad Others: Dad EtOH, learning  disabilities  Family History:  Family History  Problem Relation Age of Onset   Alcohol abuse Mother    Cancer Mother    Learning disabilities Father    Alcohol abuse Father    Heart disease Maternal Grandmother    Diabetes Maternal Grandfather     Social History:  Social History   Socioeconomic History   Marital status: Single    Spouse name: Not on file   Number of children: Not on file   Years of education: Not on file   Highest education level: Not on file  Occupational History   Not on file  Tobacco Use   Smoking status: Every Day    Current packs/day: 1.00    Average packs/day: 1 pack/day for 16.0 years (16.0 ttl pk-yrs)    Types: Cigarettes   Smokeless tobacco: Never  Vaping Use   Vaping status: Never Used  Substance and Sexual Activity   Alcohol use: Yes    Comment: socially   Drug use: No   Sexual activity: Not on file  Other Topics Concern   Not on file  Social History Narrative   Not on file   Social Determinants of Health   Financial Resource Strain: Not on file  Food Insecurity: Not on file  Transportation Needs: Not on file  Physical Activity: Not on file  Stress: Not on file  Social Connections: Unknown (08/29/2021)   [facility] from [facility]   Social Network    Social Network: Not on file    Allergies:  Allergies  Allergen Reactions   Morphine And Codeine Other (See Comments)    Behavior changes - becomes hostile/violent    Metabolic Disorder Labs: No results found for: "HGBA1C", "MPG" No results found for: "PROLACTIN" Lab Results  Component Value Date   CHOL 131 04/19/2022   TRIG 71.0 04/19/2022   HDL 72.00 04/19/2022   CHOLHDL 2 04/19/2022   VLDL 14.2 04/19/2022   LDLCALC 45 04/19/2022   Lab Results  Component Value Date   TSH 1.29 11/30/2022    Therapeutic Level Labs: No results found for: "LITHIUM" No results found for: "VALPROATE" No results found for: "CBMZ"  Current Medications: Current  Outpatient Medications  Medication Sig Dispense Refill   busPIRone (BUSPAR) 7.5 MG tablet Take 1 tablet (7.5 mg total) by mouth 2 (two) times daily. 60 tablet 1   Cholecalciferol (VITAMIN D3) 25 MCG (1000 UT) CAPS Take 1 capsule (1,000 Units total) by mouth daily. 90 capsule 3   cyanocobalamin (VITAMIN B12) 1000 MCG tablet Take 1 tablet (1,000 mcg total) by mouth daily. 90 tablet 1   gabapentin (NEURONTIN) 100 MG capsule Take 1 capsule (100 mg total) by mouth 2 (two) times daily. 60 capsule 1   lamoTRIgine (LAMICTAL) 150 MG tablet Take 1 tablet (150 mg total) by mouth at bedtime. 30 tablet 1   levonorgestrel (MIRENA) 20 MCG/24HR IUD 1 each by Intrauterine route once. Implanted 1st part of the year 2020     Multiple Vitamins-Minerals (MULTIVITAMIN WITH MINERALS) tablet Take 1 tablet by mouth daily. 90 tablet 1   Vitamin D, Ergocalciferol, (DRISDOL) 1.25 MG (50000 UNIT) CAPS capsule Take 1 capsule (50,000 Units total) by mouth every 7 (seven) days. 8  capsule 0   No current facility-administered medications for this visit.     Musculoskeletal: Strength & Muscle Tone: within normal limits Gait & Station: normal Patient leans: N/A  Psychiatric Specialty Exam: Review of Systems  Psychiatric/Behavioral:  Positive for sleep disturbance. Negative for decreased concentration, dysphoric mood, hallucinations, self-injury and suicidal ideas. The patient is nervous/anxious. The patient is not hyperactive.     Blood pressure 111/73, pulse 72, temperature (!) 97.5 F (36.4 C), temperature source Oral, height 5' 8.5" (1.74 m), weight 160 lb 6.4 oz (72.8 kg), SpO2 100%.Body mass index is 24.03 kg/m.  General Appearance: Casual  Eye Contact:  Good  Speech:  Clear and Coherent and Normal Rate  Volume:  Normal  Mood:  Anxious and Depressed  Affect:  Appropriate  Thought Process:  Coherent, Goal Directed, and Descriptions of Associations: Intact  Orientation:  Full (Time, Place, and Person)  Thought  Content: WDL   Suicidal Thoughts:  No  Homicidal Thoughts:  No  Memory:  Immediate;   Good Recent;   Good Remote;   Good  Judgement:  Good  Insight:  Good  Psychomotor Activity:  Normal  Concentration:  Concentration: Good and Attention Span: Good  Recall:  Good  Fund of Knowledge: Good  Language: Good  Akathisia:  No  Handed:  Unknown  AIMS (if indicated): not done  Assets:  Communication Skills Desire for Improvement Financial Resources/Insurance Housing Intimacy Leisure Time Physical Health Resilience Social Support Talents/Skills Transportation Vocational/Educational  ADL's:  Intact  Cognition: WNL  Sleep:  Fair   Screenings: GAD-7    Flowsheet Row Clinical Support from 01/24/2023 in [facility] Office Visit from 06/26/2022 in [facility] at [facility] Counselor from 06/05/2022 in [facility] Video Visit from 05/09/2022 in [facility] Primary Care at [facility] Office Visit from 04/19/2022 in [facility] at [facility]  Total GAD-7 Score 11 14 19  0 16      PHQ2-9    Flowsheet Row Clinical Support from 01/24/2023 in [facility] Office Visit from 06/26/2022 in [facility] Primary Care at [facility] Counselor from 06/05/2022 in [facility] Video Visit from 05/09/2022 in [facility] Primary Care at [facility] Office Visit from 04/19/2022 in [facility] Primary Care at [facility]  PHQ-2 Total Score 2 4 6 1 5   PHQ-9 Total Score 7 18 23 4 20       Flowsheet Row Clinical Support from 01/24/2023 in [facility] Counselor from 06/05/2022 in [facility] ED from 01/09/2022 in [facility] at [facility]  C-SSRS RISK CATEGORY No Risk No Risk No Risk         Assessment and Plan:   Patricia Mcmahon is a 37 year old female with a past psychiatric history significant for bipolar 1 disorder (mixed, moderate) and generalized anxiety disorder who presents to [facility] for follow-up and medication management.  Patient presents to the encounter endorsing manageable depression as well as some anxiety.  In addition to the depression and anxiety, patient endorses irregular sleep stating that the hours of sleep she receives often fluctuate.  Patient is requesting a in the management of her anxiety.  Provider recommended buspirone 7.5 mg 2 times daily for the management of her anxiety.  Patient would like to hold  off on medication for the management of her sleep.  Patient was agreeable to recommendation.  [name] medications to be e-prescribed to pharmacy of choice.  Collaboration of Care: Collaboration of Care: Medication Management AEB provider managing patient's psychiatric medications, Primary Care Provider AEB patient being seen by a family medicine provider, and Psychiatrist AEB patient being followed by mental health provider at this facility  Patient/Guardian was advised Release of Information must be obtained prior to any record release in order to collaborate their care with an outside provider. Patient/Guardian was advised if they have not already done so to contact the registration department to sign all necessary forms in order for [place] to release information regarding their care.   Consent: Patient/Guardian gives verbal consent for treatment and assignment of benefits for services provided during this visit. Patient/Guardian expressed understanding and agreed to proceed.   1. Bipolar 1 disorder, mixed, moderate (HCC)  - lamoTRIgine (LAMICTAL) 150 MG tablet; Take 1 tablet (150 mg total) by mouth at bedtime.  Dispense: 30 tablet; Refill: 1 - gabapentin (NEURONTIN) 100 MG capsule; Take 1 capsule (100 mg total)  by mouth 2 (two) times daily.  Dispense: 60 capsule; Refill: 1  2. GAD (generalized anxiety disorder)  - gabapentin (NEURONTIN) 100 MG capsule; Take 1 capsule (100 mg total) by mouth 2 (two) times daily.  Dispense: 60 capsule; Refill: 1 - busPIRone (BUSPAR) 7.5 MG tablet; Take 1 tablet (7.5 mg total) by mouth 2 (two) times daily.  Dispense: 60 tablet; Refill: 1  Patient to follow up in 6 weeks Provider spent a total of 12 minutes with the patient/reviewing patient's chart  Meta Hatchet, PA 01/25/2023, 10:16 AM

## 2023-01-25 ENCOUNTER — Encounter (HOSPITAL_COMMUNITY): Payer: Self-pay | Admitting: Physician Assistant

## 2023-01-31 DIAGNOSIS — M1611 Unilateral primary osteoarthritis, right hip: Secondary | ICD-10-CM | POA: Diagnosis not present

## 2023-01-31 DIAGNOSIS — M25561 Pain in right knee: Secondary | ICD-10-CM | POA: Diagnosis not present

## 2023-01-31 DIAGNOSIS — M25562 Pain in left knee: Secondary | ICD-10-CM | POA: Diagnosis not present

## 2023-02-03 ENCOUNTER — Encounter: Payer: Self-pay | Admitting: Psychiatry

## 2023-02-03 DIAGNOSIS — F17200 Nicotine dependence, unspecified, uncomplicated: Secondary | ICD-10-CM | POA: Insufficient documentation

## 2023-02-08 ENCOUNTER — Other Ambulatory Visit: Payer: Self-pay | Admitting: Medical Genetics

## 2023-02-08 DIAGNOSIS — Z006 Encounter for examination for normal comparison and control in clinical research program: Secondary | ICD-10-CM

## 2023-02-16 NOTE — Progress Notes (Signed)
Subjective:   Patient ID: Patricia Mcmahon, female   DOB: 37 y.o.   MRN: [id number]   HPI Patient presents stating she is concerned about nail disease both feet with discoloration mild thickness.  Also noted to have structural bunion deformity bilateral moderate in its intensity and is noted to have good digital perfusion well-oriented x 3.  Patient does smoke 1 pack/day tries to be active   Review of Systems  All other systems reviewed and are negative.       Objective:  Physical Exam Vitals and nursing note reviewed.  Constitutional:      Appearance: She is well-developed.  Pulmonary:     Effort: Pulmonary effort is normal.  Musculoskeletal:        General: Normal range of motion.  Skin:    General: Skin is warm.  Neurological:     Mental Status: She is alert.     Neurovascular status intact muscle strength was found to be adequate range of motion is adequate.  Patient is noted to have thick nailbeds with discoloration occurring within and a dark like discoloration of the hallux nails.  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  I went ahead debrided nailbeds 1-5 both feet Neutra genic bleeding patient will be seen back as needed

## 2023-03-02 ENCOUNTER — Encounter (HOSPITAL_BASED_OUTPATIENT_CLINIC_OR_DEPARTMENT_OTHER): Payer: Self-pay | Admitting: Orthopaedic Surgery

## 2023-03-02 DIAGNOSIS — M25562 Pain in left knee: Secondary | ICD-10-CM | POA: Diagnosis not present

## 2023-03-03 ENCOUNTER — Other Ambulatory Visit (HOSPITAL_BASED_OUTPATIENT_CLINIC_OR_DEPARTMENT_OTHER): Payer: Self-pay | Admitting: Orthopaedic Surgery

## 2023-03-03 DIAGNOSIS — M25562 Pain in left knee: Secondary | ICD-10-CM

## 2023-03-07 ENCOUNTER — Encounter (HOSPITAL_COMMUNITY): Payer: Self-pay | Admitting: Physician Assistant

## 2023-03-07 ENCOUNTER — Telehealth (HOSPITAL_COMMUNITY): Payer: Medicaid Other | Admitting: Physician Assistant

## 2023-03-07 DIAGNOSIS — F3162 Bipolar disorder, current episode mixed, moderate: Secondary | ICD-10-CM

## 2023-03-07 DIAGNOSIS — F411 Generalized anxiety disorder: Secondary | ICD-10-CM

## 2023-03-07 MED ORDER — LAMOTRIGINE 150 MG PO TABS: 150.0000 mg | ORAL_TABLET | Freq: Every day | ORAL | 1 refills | Status: DC

## 2023-03-07 MED ORDER — ESCITALOPRAM OXALATE 10 MG PO TABS: 10.0000 mg | ORAL_TABLET | Freq: Every day | ORAL | 1 refills | Status: DC

## 2023-03-07 MED ORDER — BUSPIRONE HCL 10 MG PO TABS: 10.0000 mg | ORAL_TABLET | Freq: Two times a day (BID) | ORAL | 1 refills | Status: DC

## 2023-03-07 NOTE — Progress Notes (Signed)
BH MD/PA/NP OP Progress Note  03/07/2023 8:00 PM Patricia Mcmahon  MRN:  [id number]  Chief Complaint:  Chief Complaint  Patient presents with   Follow-up   Medication Management   HPI:   Patricia Mcmahon. Amend is a 37 year old female with a past psychiatric history significant for bipolar 1 disorder (mixed, moderate) and generalized anxiety disorder who presents to [facility] for follow-up and medication management.  Patient is currently being managed on the following psychiatric medications:  Lamictal 150 mg at bedtime Buspirone 15 mg 2 times daily  Patient reports no adverse side effects from her current medication regimen and states that she is tolerating her medications well.  She still continues to endorse anxiety and rates her anxiety as 6 out of 10 but denies any immediate stressors.  Patient endorses elevated depression and rates her depression an 8 out of 10 with 10 being most severe.  Patient endorses depressive episodes 4 days out of the week with symptoms lasting the whole day.  Patient endorses the following depressive symptoms: feelings of sadness, lack of motivation, decreased concentration, decreased energy, irritability, excessive worrying, feelings of guilt/worthlessness, and hopelessness.  Patient denies worsening factors to her depression.  She also denies any alleviating factors.  Patient is interested in being on antidepressants to help manage her worsening depressive symptoms.  A PHQ-9 screen was performed the patient scoring a 16.  A GAD-7 screen was also performed with the patient scoring at 13.  Patient is alert and oriented x 4, calm, cooperative, and fully engaged in conversation during the encounter.  Patient describes her mood as anxious.  Patient denies suicidal or homicidal ideations.  She further denies auditory or visual hallucinations and does not appear to be responding to internal/external stimuli.  Patient endorses fair sleep  and receives on average 4 to 5 hours of sleep per night.  Patient endorses good appetite and eats on average 2-3 meals per day.  Patient endorses alcohol consumption stating that she drinks socially.  Patient endorses tobacco use and smokes on average 1/2 pack/day.  Patient denies illicit drug use.  Visit Diagnosis:    ICD-10-CM   1. Bipolar 1 disorder, mixed, moderate (HCC)  F31.62 lamoTRIgine (LAMICTAL) 150 MG tablet    2. 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  Head trauma: Denied Seizures: Denied DT: Denied Allergies: Morphine and codeine   Past Medical History:  Past Medical History:  Diagnosis Date   GERD (gastroesophageal reflux disease)    no current med.   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   Left knee pain 12/16/2012   Thrombocytopenia (HCC) 08/16/2011    Past Surgical History:  Procedure Laterality Date   CESAREAN SECTION     ESOPHAGOGASTRODUODENOSCOPY N/A 01/14/2021   Procedure: ESOPHAGOGASTRODUODENOSCOPY (EGD);  Surgeon: Willis Modena, MD;  Location: [facility];  Service: Endoscopy;  Laterality: N/A;   GASTRECTOMY     sleeve   KNEE ARTHROSCOPY  04/02/2012   Procedure: ARTHROSCOPY KNEE;  Surgeon: Velna Ochs, MD;  Location: [facility];  Service: Orthopedics;  Laterality: Left;  left knee arthroscopy chondroplasty   KNEE ARTHROSCOPY WITH LATERAL RELEASE Left 01/07/2013   Procedure: LEFT KNEE ARTHROSCOPY, CHONDROPLASTY AND  LATERAL RELEASE;  Surgeon: Velna Ochs, MD;  Location: [facility];  Service: Orthopedics;  Laterality: Left;    Family Psychiatric History:  Suicide: Paternal aunt completed  suicide Homicide: Unsure Psych hospitalization: Denied BiPD: ?Dad SCZ/SCzA: ?Dad Others: Dad EtOH, learning disabilities  Family History:  Family History  Problem Relation Age of Onset   Alcohol abuse Mother    Cancer Mother    Learning disabilities Father    Alcohol abuse Father    Heart disease Maternal Grandmother    Diabetes Maternal Grandfather     Social History:  Social History   Socioeconomic History   Marital status: Single    Spouse name: Not on file   Number of children: Not on file   Years of education: Not on file   Highest education level: Not on file  Occupational History   Not on file  Tobacco Use   Smoking status: Every Day    Current packs/day: 1.00    Average packs/day: 1 pack/day for 16.0 years (16.0 ttl pk-yrs)    Types: Cigarettes   Smokeless tobacco: Never  Vaping Use   Vaping status: Never Used  Substance and Sexual Activity   Alcohol use: Yes    Comment: socially   Drug use: No   Sexual activity: Not on file  Other Topics Concern   Not on file  Social History Narrative   Not on file   Social Determinants of Health   Financial Resource Strain: Not on file  Food Insecurity: Not on file  Transportation Needs: Not on file  Physical Activity: Not on file  Stress: Not on file  Social Connections: Unknown (08/29/2021)   Received from [facility]   Social Network    Social Network: Not on file    Allergies:  Allergies  Allergen Reactions   Morphine And Codeine Other (See Comments)    Behavior changes - becomes hostile/violent    Metabolic Disorder Labs: No results found for: "HGBA1C", "MPG" No results found for: "PROLACTIN" Lab Results  Component Value Date   CHOL 131 04/19/2022   TRIG 71.0 04/19/2022   HDL 72.00 04/19/2022   CHOLHDL 2 04/19/2022   VLDL 14.2 04/19/2022   LDLCALC 45 04/19/2022   Lab Results  Component Value Date   TSH 1.29 11/30/2022    Therapeutic Level Labs: No results found for:  "LITHIUM" No results found for: "VALPROATE" No results found for: "CBMZ"  Current Medications: Current Outpatient Medications  Medication Sig Dispense Refill   escitalopram (LEXAPRO) 10 MG tablet Take 1 tablet (10 mg total) by mouth daily. 30 tablet 1   busPIRone (BUSPAR) 10 MG tablet Take 1 tablet (10 mg total) by mouth 2 (two) times daily. 60 tablet 1   Cholecalciferol (VITAMIN D3) 25 MCG (1000 UT) CAPS Take 1 capsule (1,000 Units total) by mouth daily. 90 capsule 3   cyanocobalamin (VITAMIN B12) 1000 MCG tablet Take 1 tablet (1,000 mcg total) by mouth daily. 90 tablet 1   gabapentin (NEURONTIN) 100 MG capsule Take 1 capsule (100 mg total) by mouth 2 (two) times daily. 60 capsule 1   lamoTRIgine (LAMICTAL) 150 MG tablet Take 1 tablet (150 mg total) by mouth at bedtime. 30 tablet 1   levonorgestrel (MIRENA) 20 MCG/24HR IUD 1 each by Intrauterine route once. Implanted 1st part of the year 2020     Multiple Vitamins-Minerals (MULTIVITAMIN WITH MINERALS) tablet Take 1 tablet by mouth daily. 90 tablet 1   Vitamin D, Ergocalciferol, (DRISDOL)  1.25 MG (50000 UNIT) CAPS capsule Take 1 capsule (50,000 Units total) by mouth every 7 (seven) days. 8 capsule 0   No current facility-administered medications for this visit.     Musculoskeletal: Strength & Muscle Tone: within normal limits Gait & Station: normal Patient leans: N/A  Psychiatric Specialty Exam: Review of Systems  Psychiatric/Behavioral:  Positive for dysphoric mood and sleep disturbance. Negative for decreased concentration, hallucinations, self-injury and suicidal ideas. The patient is nervous/anxious. The patient is not hyperactive.     There were no vitals taken for this visit.There is no height or weight on file to calculate BMI.  General Appearance: Casual  Eye Contact:  Good  Speech:  Clear and Coherent and Normal Rate  Volume:  Normal  Mood:  Anxious and Depressed  Affect:  Congruent  Thought Process:  Coherent, Goal  Directed, and Descriptions of Associations: Intact  Orientation:  Full (Time, Place, and Person)  Thought Content: WDL   Suicidal Thoughts:  No  Homicidal Thoughts:  No  Memory:  Immediate;   Good Recent;   Good Remote;   Good  Judgement:  Good  Insight:  Good  Psychomotor Activity:  Normal  Concentration:  Concentration: Good and Attention Span: Good  Recall:  Good  Fund of Knowledge: Good  Language: Good  Akathisia:  No  Handed:  Unknown  AIMS (if indicated): not done  Assets:  Communication Skills Desire for Improvement Financial Resources/Insurance Housing Intimacy Leisure Time Physical Health Resilience Social Support Talents/Skills Transportation Vocational/Educational  ADL's:  Intact  Cognition: WNL  Sleep:  Fair   Screenings: GAD-7    Flowsheet Row Video Visit from 03/07/2023 in [facility] Clinical Support from 01/24/2023 in [facility] Office Visit from 06/26/2022 in [facility] Counselor from 06/05/2022 in [facility] Video Visit from 05/09/2022 in [facility] at [facility]  Total GAD-7 Score 13 11 14 19  0      PHQ2-9    Flowsheet Row Video Visit from 03/07/2023 in [facility] Clinical Support from 01/24/2023 in [facility] Office Visit from 06/26/2022 in [facility] Primary [facility] at [facility] Counselor from 06/05/2022 in [facility] Video Visit from 05/09/2022 in [facility] Primary Care at [facility]  PHQ-2 Total Score 5 2 4 6 1   PHQ-9 Total Score 16 7 18 23 4       Flowsheet Row Video Visit from 03/07/2023 in [facility] Clinical Support from 01/24/2023 in [facility] Counselor from 06/05/2022 in [facility]  C-SSRS RISK CATEGORY No Risk No Risk No Risk        Assessment and Plan:   Anina Carlsson. Zuloaga is a 37 year old female with a past psychiatric history significant for bipolar 1 disorder (mixed, moderate) and generalized anxiety disorder who presents to [facility] for follow-up and medication management.  Patient presents to the encounter stating that she has been tolerating her medications well.  She continues to endorse anxiety as well as ongoing depressive symptoms.  Patient is interested in being placed on an antidepressant in hopes that will help her manage her depressive symptoms and anxiety.  Provider informed patient that due to her diagnosis of bipolar disorder, putting her on an antidepressant puts her at risk of having a  manic episode.  Patient states that she has been on a variety of medications including Latuda, Abilify, Vraylar, and Seroquel.  Patient was comfortable with starting an antidepressant.  Provider recommended Lexapro 10 mg daily for the management of her depressive symptoms and anxiety.  Patient was agreeable to recommendation.  Patient medication to be prescribed to pharmacy of choice.  Provider informed patient to contact 911 or check into a crisis center if she started experiencing manic episodes such as increased irritability, increased energy, distractibility, delusions, or hallucinations.  Patient vocalized understanding.  Collaboration of Care: Collaboration of Care: Medication Management AEB provider managing patient's psychiatric medications, Primary Care Provider AEB patient being seen by a family medicine provider, and Psychiatrist AEB patient being followed by mental health provider at this facility  Patient/Guardian was advised Release of Information must be obtained prior to any record release in order to collaborate their care with an outside provider. Patient/Guardian was advised if they have not already done  so to contact the registration department to sign all necessary forms in order for [place] to release information regarding their care.   Consent: Patient/Guardian gives verbal consent for treatment and assignment of benefits for services provided during this visit. Patient/Guardian expressed understanding and agreed to proceed.   1. Bipolar 1 disorder, mixed, moderate (HCC)  - lamoTRIgine (LAMICTAL) 150 MG tablet; Take 1 tablet (150 mg total) by mouth at bedtime.  Dispense: 30 tablet; Refill: 1  2. GAD (generalized anxiety disorder)  - busPIRone (BUSPAR) 10 MG tablet; Take 1 tablet (10 mg total) by mouth 2 (two) times daily.  Dispense: 60 tablet; Refill: 1 - escitalopram (LEXAPRO) 10 MG tablet; Take 1 tablet (10 mg total) by mouth daily.  Dispense: 30 tablet; Refill: 1  Patient to follow up in 6 weeks Provider spent a total of 18 minutes with the patient/reviewing patient's chart  Meta Hatchet, PA 03/07/2023, 8:00 PM

## 2023-04-01 ENCOUNTER — Ambulatory Visit (HOSPITAL_BASED_OUTPATIENT_CLINIC_OR_DEPARTMENT_OTHER): Payer: Medicaid Other

## 2023-04-05 ENCOUNTER — Telehealth (HOSPITAL_COMMUNITY): Payer: Self-pay | Admitting: Physician Assistant

## 2023-04-08 ENCOUNTER — Ambulatory Visit (HOSPITAL_BASED_OUTPATIENT_CLINIC_OR_DEPARTMENT_OTHER): Payer: Medicaid Other

## 2023-04-22 ENCOUNTER — Ambulatory Visit (HOSPITAL_BASED_OUTPATIENT_CLINIC_OR_DEPARTMENT_OTHER)
Admission: RE | Admit: 2023-04-22 | Discharge: 2023-04-22 | Disposition: A | Discharge status: Home / Self Care | Payer: Medicaid Other | Source: Ambulatory Visit | Attending: Orthopaedic Surgery | Admitting: Orthopaedic Surgery

## 2023-04-22 DIAGNOSIS — M1712 Unilateral primary osteoarthritis, left knee: Secondary | ICD-10-CM | POA: Diagnosis not present

## 2023-04-22 DIAGNOSIS — M25562 Pain in left knee: Secondary | ICD-10-CM | POA: Insufficient documentation

## 2023-04-22 DIAGNOSIS — G8929 Other chronic pain: Secondary | ICD-10-CM | POA: Diagnosis not present

## 2023-04-23 DIAGNOSIS — M25562 Pain in left knee: Secondary | ICD-10-CM | POA: Diagnosis not present

## 2023-04-24 ENCOUNTER — Encounter (HOSPITAL_COMMUNITY): Payer: Self-pay | Admitting: Physician Assistant

## 2023-04-24 ENCOUNTER — Telehealth (HOSPITAL_COMMUNITY): Payer: Medicaid Other | Admitting: Physician Assistant

## 2023-04-24 DIAGNOSIS — F3162 Bipolar disorder, current episode mixed, moderate: Secondary | ICD-10-CM

## 2023-04-24 DIAGNOSIS — F411 Generalized anxiety disorder: Secondary | ICD-10-CM | POA: Diagnosis not present

## 2023-04-24 MED ORDER — OLANZAPINE 5 MG PO TABS: 5.0000 mg | ORAL_TABLET | Freq: Every day | ORAL | 1 refills | Status: DC

## 2023-04-24 MED ORDER — LAMOTRIGINE 150 MG PO TABS: 150.0000 mg | ORAL_TABLET | Freq: Every day | ORAL | 1 refills | Status: DC

## 2023-04-24 MED ORDER — BUSPIRONE HCL 15 MG PO TABS: 15.0000 mg | ORAL_TABLET | Freq: Two times a day (BID) | ORAL | 1 refills | Status: DC

## 2023-04-24 NOTE — Progress Notes (Signed)
 BH MD/PA/NP OP Progress Note  Virtual Visit via Video Note  I connected with Patricia Mcmahon on 04/24/23 at  4:30 PM EST by a video enabled telemedicine application and verified that I am speaking with the correct person using two identifiers.  Location: Patient: Home Provider: Clinic   I discussed the limitations of evaluation and management by telemedicine and the availability of in person appointments. The patient expressed understanding and agreed to proceed.  Follow Up Instructions:   I discussed the assessment and treatment plan with the patient. The patient was provided an opportunity to ask questions and all were answered. The patient agreed with the plan and demonstrated an understanding of the instructions.   The patient was advised to call back or seek an in-person evaluation if the symptoms worsen or if the condition fails to improve as anticipated.  I provided 15 minutes of non-face-to-face time during this encounter.  [name], PA    04/24/2023 11:26 PM Patricia Mcmahon  MRN:  [id number]  Chief Complaint:  Chief Complaint  Patient presents with   Follow-up   Medication Management   HPI:   Patricia Mcmahon is a 38 year old female with a past psychiatric history significant for bipolar 1 disorder (mixed, moderate) and generalized anxiety disorder who presents to [facility] for follow-up and medication management.  Patient is currently being managed on the following psychiatric medications:  Lamictal  150 mg at bedtime Buspirone  10 mg 2 times daily  Patient presents to the encounter stating that she continues to experience depressive symptoms.  Patient rates her depression a 7 out of 10 with 10 being most severe.  Patient endorses depressive episodes 4 days out of the week.  Patient endorses the following depressive symptoms: feelings of sadness, lack of motivation, irritability, feelings of guilt/worthlessness, decreased  energy, and hopelessness.  Patient denies experiencing any manic symptoms.  In addition to her depressive symptoms, patient endorses elevated anxiety and rates her anxiety an 8-9 out of 10.  Patient denies any new stressors at this time.  Patient has been on the following psychiatric medications in the past: Latuda, Abilify, Vraylar , and Seroquel .  A PHQ-9 screen was performed with the patient scoring a 21.  A GAD-7 screen was also performed with the patient scoring an 18.  Patient is alert and oriented x 4, calm, cooperative, and fully engaged in conversation during the encounter.  Patient describes her mood as down and anxious.  Patient exhibits depressed mood with congruent affect.  Patient denies suicidal or homicidal ideations.  She further denies auditory or visual hallucinations and does not appear to be responding to internal/external stimuli.  Patient endorses poor sleep and receives on average 1 to 2 hours of sleep at a time (4 to 5 hours of sleep total).  Patient endorses fair appetite and eats on average 2 meals per day.  Patient denies alcohol consumption, or illicit drug use.  Patient endorses tobacco use and smokes on average 1/2 pack/day.  Visit Diagnosis:    ICD-10-CM   1. Bipolar 1 disorder, mixed, moderate (HCC)  F31.62 lamoTRIgine  (LAMICTAL ) 150 MG tablet    OLANZapine  (ZYPREXA ) 5 MG tablet    2. 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  Head  trauma: Denied Seizures: Denied DT: Denied Allergies: Morphine and codeine   Past Medical History:  Past Medical History:  Diagnosis Date   GERD (gastroesophageal reflux disease)    no current med.   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   Left  knee pain 12/16/2012   Thrombocytopenia (HCC) 08/16/2011    Past Surgical History:  Procedure Laterality Date   CESAREAN SECTION     ESOPHAGOGASTRODUODENOSCOPY N/A 01/14/2021   Procedure: ESOPHAGOGASTRODUODENOSCOPY (EGD);  Surgeon: Burnette Fallow, MD;  Location: [facility];  Service: Endoscopy;  Laterality: N/A;   GASTRECTOMY     sleeve   KNEE ARTHROSCOPY  04/02/2012   Procedure: ARTHROSCOPY KNEE;  Surgeon: Maude KANDICE Herald, MD;  Location: [facility];  Service: Orthopedics;  Laterality: Left;  left knee arthroscopy chondroplasty   KNEE ARTHROSCOPY WITH LATERAL RELEASE Left 01/07/2013   Procedure: LEFT KNEE ARTHROSCOPY, CHONDROPLASTY AND  LATERAL RELEASE;  Surgeon: Maude KANDICE Herald, MD;  Location:  SURGERY CENTER;  Service: Orthopedics;  Laterality: Left;    Family Psychiatric History:  Suicide: Paternal aunt completed suicide Homicide: Unsure Psych hospitalization: Denied BiPD: ?Dad SCZ/SCzA: ?Dad Others: Dad EtOH, learning disabilities  Family History:  Family History  Problem Relation Age of Onset   Alcohol abuse Mother    Cancer Mother    Learning disabilities Father    Alcohol abuse Father    Heart disease Maternal Grandmother    Diabetes Maternal Grandfather     Social History:  Social History   Socioeconomic History   Marital status: Single    Spouse name: Not on file   Number of children: Not on file   Years of education: Not on file   Highest education level: Not on file  Occupational History   Not on file  Tobacco Use   Smoking status: Every Day    Current packs/day: 1.00    Average packs/day: 1 pack/day for 16.0 years (16.0 ttl pk-yrs)    Types: Cigarettes   Smokeless tobacco: Never  Vaping Use   Vaping status: Never Used  Substance and Sexual Activity   Alcohol use: Yes    Comment: socially   Drug use: No   Sexual activity: Not on file  Other Topics Concern   Not on file  Social History Narrative   Not on file    Social Drivers of Health   Financial Resource Strain: Not on file  Food Insecurity: Not on file  Transportation Needs: Not on file  Physical Activity: Not on file  Stress: Not on file  Social Connections: Unknown (08/29/2021)   Received from [facility], [facility]   Social Network    Social Network: Not on file    Allergies:  Allergies  Allergen Reactions   Morphine And Codeine Other (See Comments)    Behavior changes - becomes hostile/violent    Metabolic Disorder Labs: No results found for: HGBA1C, MPG No results found for: PROLACTIN Lab Results  Component Value Date   CHOL 131 04/19/2022   TRIG 71.0 04/19/2022   HDL 72.00 04/19/2022   CHOLHDL 2 04/19/2022   VLDL 14.2 04/19/2022   LDLCALC 45 04/19/2022   Lab Results  Component Value Date   TSH 1.29 11/30/2022    Therapeutic Level Labs: No results found for: LITHIUM No results found for: VALPROATE No results found for: CBMZ  Current Medications: Current Outpatient Medications  Medication Sig Dispense Refill   OLANZapine  (ZYPREXA ) 5 MG tablet Take 1 tablet (5 mg total) by mouth at bedtime.  30 tablet 1   busPIRone  (BUSPAR ) 15 MG tablet Take 1 tablet (15 mg total) by mouth 2 (two) times daily. 60 tablet 1   Cholecalciferol (VITAMIN D3) 25 MCG (1000 UT) CAPS Take 1 capsule (1,000 Units total) by mouth daily. 90 capsule 3   cyanocobalamin  (VITAMIN B12) 1000 MCG tablet Take 1 tablet (1,000 mcg total) by mouth daily. 90 tablet 1   escitalopram  (LEXAPRO ) 10 MG tablet Take 1 tablet (10 mg total) by mouth daily. 30 tablet 1   gabapentin  (NEURONTIN ) 100 MG capsule Take 1 capsule (100 mg total) by mouth 2 (two) times daily. 60 capsule 1   lamoTRIgine  (LAMICTAL ) 150 MG tablet Take 1 tablet (150 mg total) by mouth at bedtime. 30 tablet 1   levonorgestrel  (MIRENA ) 20 MCG/24HR IUD 1 each by Intrauterine route once. Implanted 1st part of the year 2020     Multiple Vitamins-Minerals (MULTIVITAMIN WITH  MINERALS) tablet Take 1 tablet by mouth daily. 90 tablet 1   Vitamin D , Ergocalciferol , (DRISDOL ) 1.25 MG (50000 UNIT) CAPS capsule Take 1 capsule (50,000 Units total) by mouth every 7 (seven) days. 8 capsule 0   No current facility-administered medications for this visit.     Musculoskeletal: Strength & Muscle Tone: within normal limits Gait & Station: normal Patient leans: N/A  Psychiatric Specialty Exam: Review of Systems  Psychiatric/Behavioral:  Positive for dysphoric mood and sleep disturbance. Negative for decreased concentration, hallucinations, self-injury and suicidal ideas. The patient is nervous/anxious. The patient is not hyperactive.     There were no vitals taken for this visit.There is no height or weight on file to calculate BMI.  General Appearance: Casual  Eye Contact:  Good  Speech:  Clear and Coherent and Normal Rate  Volume:  Normal  Mood:  Anxious and Depressed  Affect:  Congruent  Thought Process:  Coherent, Goal Directed, and Descriptions of Associations: Intact  Orientation:  Full (Time, Place, and Person)  Thought Content: WDL   Suicidal Thoughts:  No  Homicidal Thoughts:  No  Memory:  Immediate;   Good Recent;   Good Remote;   Good  Judgement:  Good  Insight:  Good  Psychomotor Activity:  Normal  Concentration:  Concentration: Good and Attention Span: Good  Recall:  Good  Fund of Knowledge: Good  Language: Good  Akathisia:  No  Handed:  Unknown  AIMS (if indicated): not done  Assets:  Communication Skills Desire for Improvement Financial Resources/Insurance Housing Intimacy Leisure Time Physical Health Resilience Social Support Talents/Skills Transportation Vocational/Educational  ADL's:  Intact  Cognition: WNL  Sleep:  Fair   Screenings: GAD-7    Flowsheet Row Video Visit from 04/24/2023 in [facility] Video Visit from 03/07/2023 in [facility] Clinical Support from  01/24/2023 in [facility] Office Visit from 06/26/2022 in [facility] at [facility] Counselor from 06/05/2022 in [facility]  Total GAD-7 Score 18 13 11 14 19       PHQ2-9    Flowsheet Row Video Visit from 04/24/2023 in [facility] Video Visit from 03/07/2023 in [facility] Clinical Support from 01/24/2023 in [facility] Office Visit from 06/26/2022 in [facility] Primary Care at [facility] Counselor from 06/05/2022 in [facility]  PHQ-2 Total Score 6 5 2 4 6   PHQ-9 Total Score 21 16 7 18  23  Flowsheet Row Video Visit from 04/24/2023 in [facility] Video Visit from 03/07/2023 in [facility] Clinical Support from 01/24/2023 in [facility]  C-SSRS RISK CATEGORY No Risk No Risk No Risk        Assessment and Plan:   Patricia Mcmahon is a 38 year old female with a past psychiatric history significant for bipolar 1 disorder (mixed, moderate) and generalized anxiety disorder who presents to [facility] for follow-up and medication management.  Patient presents to the encounter endorsing depressive symptoms and anxiety.  Patient denies experiencing any manic symptoms at this time.  Patient also endorses poor sleep and is only able to sleep 1 to 2 hours at a time.  She reports that she continues to take her medications regularly.  Patient has been on the following psychiatric medications in the past: Abilify, Vraylar , Seroquel , and Latuda.  Provider recommended olanzapine  5 mg at bedtime for the management of her depressive symptoms and for mood stability.  Patient was agreeable to recommendation.  [name] medications to be e-prescribed to pharmacy of choice.    Collaboration of Care: Collaboration of Care: Medication Management AEB provider managing patient's psychiatric medications, Primary Care Provider AEB patient being seen by a family medicine provider, and Psychiatrist AEB patient being followed by mental health provider at this facility  Patient/Guardian was advised Release of Information must be obtained prior to any record release in order to collaborate their care with an outside provider. Patient/Guardian was advised if they have not already done so to contact the registration department to sign all necessary forms in order for us  to release information regarding their care.   Consent: Patient/Guardian gives verbal consent for treatment and assignment of benefits for services provided during this visit. Patient/Guardian expressed understanding and agreed to proceed.   1. Bipolar 1 disorder, mixed, moderate (HCC)  - lamoTRIgine  (LAMICTAL ) 150 MG tablet; Take 1 tablet (150 mg total) by mouth at bedtime.  Dispense: 30 tablet; Refill: 1 - OLANZapine  (ZYPREXA ) 5 MG tablet; Take 1 tablet (5 mg total) by mouth at bedtime.  Dispense: 30 tablet; Refill: 1  2. GAD (generalized anxiety disorder)  - busPIRone  (BUSPAR ) 15 MG tablet; Take 1 tablet (15 mg total) by mouth 2 (two) times daily.  Dispense: 60 tablet; Refill: 1  Patient to follow up in 6 weeks Provider spent a total of 15 minutes with the patient/reviewing patient's chart  [name], PA 04/24/2023, 11:26 PM

## 2023-05-01 ENCOUNTER — Telehealth (HOSPITAL_COMMUNITY): Payer: Self-pay | Admitting: *Deleted

## 2023-05-01 NOTE — Telephone Encounter (Signed)
 Fax received for prior authorization of Olanzapine 5mg . Submitted online with cover my meds. Awaiting decision.

## 2023-05-02 ENCOUNTER — Telehealth (HOSPITAL_COMMUNITY): Payer: Self-pay | Admitting: *Deleted

## 2023-05-02 NOTE — Telephone Encounter (Signed)
 Fax received for approval of Olanzapine  5mg  from [employer] until 04/30/24. Called to notify pharmacy.

## 2023-05-17 ENCOUNTER — Inpatient Hospital Stay (HOSPITAL_BASED_OUTPATIENT_CLINIC_OR_DEPARTMENT_OTHER)
Admission: EM | Admit: 2023-05-17 | Discharge: 2023-05-21 | DRG: 871 | Disposition: A | Discharge status: Home / Self Care | Payer: Medicaid Other | Attending: Internal Medicine | Admitting: Internal Medicine

## 2023-05-17 ENCOUNTER — Emergency Department (HOSPITAL_BASED_OUTPATIENT_CLINIC_OR_DEPARTMENT_OTHER): Payer: Medicaid Other

## 2023-05-17 ENCOUNTER — Encounter (HOSPITAL_BASED_OUTPATIENT_CLINIC_OR_DEPARTMENT_OTHER): Payer: Self-pay | Admitting: Emergency Medicine

## 2023-05-17 ENCOUNTER — Other Ambulatory Visit: Payer: Self-pay

## 2023-05-17 ENCOUNTER — Ambulatory Visit: Payer: Self-pay | Admitting: Family Medicine

## 2023-05-17 DIAGNOSIS — K219 Gastro-esophageal reflux disease without esophagitis: Secondary | ICD-10-CM | POA: Diagnosis present

## 2023-05-17 DIAGNOSIS — Z9884 Bariatric surgery status: Secondary | ICD-10-CM | POA: Diagnosis not present

## 2023-05-17 DIAGNOSIS — R0789 Other chest pain: Secondary | ICD-10-CM | POA: Diagnosis not present

## 2023-05-17 DIAGNOSIS — R0989 Other specified symptoms and signs involving the circulatory and respiratory systems: Secondary | ICD-10-CM | POA: Diagnosis not present

## 2023-05-17 DIAGNOSIS — Z811 Family history of alcohol abuse and dependence: Secondary | ICD-10-CM

## 2023-05-17 DIAGNOSIS — R0781 Pleurodynia: Secondary | ICD-10-CM | POA: Diagnosis present

## 2023-05-17 DIAGNOSIS — S2243XA Multiple fractures of ribs, bilateral, initial encounter for closed fracture: Secondary | ICD-10-CM

## 2023-05-17 DIAGNOSIS — R0603 Acute respiratory distress: Secondary | ICD-10-CM | POA: Diagnosis present

## 2023-05-17 DIAGNOSIS — A419 Sepsis, unspecified organism: Secondary | ICD-10-CM | POA: Diagnosis not present

## 2023-05-17 DIAGNOSIS — R197 Diarrhea, unspecified: Secondary | ICD-10-CM | POA: Diagnosis not present

## 2023-05-17 DIAGNOSIS — R918 Other nonspecific abnormal finding of lung field: Secondary | ICD-10-CM | POA: Diagnosis not present

## 2023-05-17 DIAGNOSIS — F17210 Nicotine dependence, cigarettes, uncomplicated: Secondary | ICD-10-CM | POA: Diagnosis not present

## 2023-05-17 DIAGNOSIS — E876 Hypokalemia: Secondary | ICD-10-CM | POA: Diagnosis not present

## 2023-05-17 DIAGNOSIS — S2241XA Multiple fractures of ribs, right side, initial encounter for closed fracture: Secondary | ICD-10-CM | POA: Diagnosis present

## 2023-05-17 DIAGNOSIS — J101 Influenza due to other identified influenza virus with other respiratory manifestations: Secondary | ICD-10-CM

## 2023-05-17 DIAGNOSIS — J159 Unspecified bacterial pneumonia: Secondary | ICD-10-CM | POA: Diagnosis not present

## 2023-05-17 DIAGNOSIS — T17890A Other foreign object in other parts of respiratory tract causing asphyxiation, initial encounter: Secondary | ICD-10-CM | POA: Diagnosis present

## 2023-05-17 DIAGNOSIS — I2699 Other pulmonary embolism without acute cor pulmonale: Secondary | ICD-10-CM | POA: Diagnosis not present

## 2023-05-17 DIAGNOSIS — J189 Pneumonia, unspecified organism: Secondary | ICD-10-CM | POA: Diagnosis not present

## 2023-05-17 DIAGNOSIS — W1789XA Other fall from one level to another, initial encounter: Secondary | ICD-10-CM | POA: Diagnosis present

## 2023-05-17 DIAGNOSIS — R079 Chest pain, unspecified: Secondary | ICD-10-CM | POA: Diagnosis not present

## 2023-05-17 DIAGNOSIS — Z8249 Family history of ischemic heart disease and other diseases of the circulatory system: Secondary | ICD-10-CM | POA: Diagnosis not present

## 2023-05-17 DIAGNOSIS — Z833 Family history of diabetes mellitus: Secondary | ICD-10-CM

## 2023-05-17 DIAGNOSIS — F319 Bipolar disorder, unspecified: Secondary | ICD-10-CM | POA: Diagnosis present

## 2023-05-17 DIAGNOSIS — E872 Acidosis, unspecified: Secondary | ICD-10-CM | POA: Diagnosis present

## 2023-05-17 DIAGNOSIS — Z79899 Other long term (current) drug therapy: Secondary | ICD-10-CM

## 2023-05-17 DIAGNOSIS — J1008 Influenza due to other identified influenza virus with other specified pneumonia: Secondary | ICD-10-CM | POA: Diagnosis not present

## 2023-05-17 LAB — PREGNANCY, URINE: Preg Test, Ur: NEGATIVE

## 2023-05-17 LAB — HEPATIC FUNCTION PANEL
ALT: 19 U/L (ref 0–44)
AST: 26 U/L (ref 15–41)
Albumin: 3.8 g/dL (ref 3.5–5.0)
Alkaline Phosphatase: 55 U/L (ref 38–126)
Bilirubin, Direct: 0.3 mg/dL — ABNORMAL HIGH (ref 0.0–0.2)
Indirect Bilirubin: 0.8 mg/dL (ref 0.3–0.9)
Total Bilirubin: 1.1 mg/dL (ref 0.0–1.2)
Total Protein: 7.4 g/dL (ref 6.5–8.1)

## 2023-05-17 LAB — RESP PANEL BY RT-PCR (RSV, FLU A&B, COVID)  RVPGX2
Influenza A by PCR: POSITIVE — AB
Influenza B by PCR: NEGATIVE
Resp Syncytial Virus by PCR: NEGATIVE
SARS Coronavirus 2 by RT PCR: NEGATIVE

## 2023-05-17 LAB — LACTIC ACID, PLASMA
Lactic Acid, Venous: 2.6 mmol/L (ref 0.5–1.9)
Lactic Acid, Venous: 1.9 mmol/L (ref 0.5–1.9)

## 2023-05-17 LAB — TROPONIN I (HIGH SENSITIVITY)
Troponin I (High Sensitivity): <2 ng/L (ref <18)
Troponin I (High Sensitivity): 2 ng/L (ref <18)

## 2023-05-17 LAB — D-DIMER, QUANTITATIVE: D-Dimer, Quant: 1.79 ug{FEU}/mL — ABNORMAL HIGH (ref 0.00–0.50)

## 2023-05-17 LAB — BASIC METABOLIC PANEL
Anion gap: 9 (ref 5–15)
BUN: 11 mg/dL (ref 6–20)
CO2: 26 mmol/L (ref 22–32)
Calcium: 9.4 mg/dL (ref 8.9–10.3)
Chloride: 96 mmol/L — ABNORMAL LOW (ref 98–111)
Creatinine, Ser: 0.99 mg/dL (ref 0.44–1.00)
GFR, Estimated: >60 mL/min (ref >60)
Glucose, Bld: 120 mg/dL — ABNORMAL HIGH (ref 70–99)
Potassium: 4 mmol/L (ref 3.5–5.1)
Sodium: 131 mmol/L — ABNORMAL LOW (ref 135–145)

## 2023-05-17 LAB — CBC
HCT: 40.3 % (ref 36.0–46.0)
Hemoglobin: 14 g/dL (ref 12.0–15.0)
MCH: 32.2 pg (ref 26.0–34.0)
MCHC: 34.7 g/dL (ref 30.0–36.0)
MCV: 92.6 fL (ref 80.0–100.0)
Platelets: 183 10*3/uL (ref 150–400)
RBC: 4.35 MIL/uL (ref 3.87–5.11)
RDW: 12.2 % (ref 11.5–15.5)
WBC: 11.3 10*3/uL — ABNORMAL HIGH (ref 4.0–10.5)
nRBC: 0 % (ref 0.0–0.2)

## 2023-05-17 LAB — URINALYSIS, W/ REFLEX TO CULTURE (INFECTION SUSPECTED)
Glucose, UA: NEGATIVE mg/dL
Hgb urine dipstick: NEGATIVE
Ketones, ur: NEGATIVE mg/dL
Leukocytes,Ua: NEGATIVE
Nitrite: NEGATIVE
Protein, ur: 100 mg/dL — AB
Specific Gravity, Urine: 1.025 (ref 1.005–1.030)
WBC, UA: NONE SEEN WBC/hpf (ref 0–5)
pH: 5.5 (ref 5.0–8.0)

## 2023-05-17 LAB — LIPASE, BLOOD: Lipase: 22 U/L (ref 11–51)

## 2023-05-17 MED ORDER — ONDANSETRON HCL 4 MG/2ML IJ SOLN
4.0000 mg | Freq: Once | INTRAMUSCULAR | Status: AC
  Administered 2023-05-17: 4 mg via INTRAVENOUS
  Filled 2023-05-17: qty 2

## 2023-05-17 MED ORDER — IBUPROFEN 200 MG PO TABS
800.0000 mg | ORAL_TABLET | Freq: Three times a day (TID) | ORAL | Status: DC | PRN
  Administered 2023-05-18 – 2023-05-19 (×2): 800 mg via ORAL
  Filled 2023-05-17 (×2): qty 4

## 2023-05-17 MED ORDER — SODIUM CHLORIDE 0.9 % IV BOLUS
1000.0000 mL | Freq: Once | INTRAVENOUS | Status: AC
  Administered 2023-05-17: 1000 mL via INTRAVENOUS

## 2023-05-17 MED ORDER — OLANZAPINE 5 MG PO TABS
5.0000 mg | ORAL_TABLET | Freq: Every day | ORAL | Status: DC
  Filled 2023-05-17: qty 1

## 2023-05-17 MED ORDER — HYDROMORPHONE HCL 1 MG/ML IJ SOLN
1.0000 mg | Freq: Once | INTRAMUSCULAR | Status: AC
  Administered 2023-05-17: 1 mg via INTRAVENOUS
  Filled 2023-05-17: qty 1

## 2023-05-17 MED ORDER — TRAZODONE HCL 50 MG PO TABS
50.0000 mg | ORAL_TABLET | Freq: Every day | ORAL | Status: DC
Start: 2023-05-17 — End: 2023-05-21
  Administered 2023-05-17 – 2023-05-20 (×4): 50 mg via ORAL
  Filled 2023-05-17 (×4): qty 1

## 2023-05-17 MED ORDER — SODIUM CHLORIDE 0.9 % IV SOLN
2.0000 g | INTRAVENOUS | Status: DC
  Administered 2023-05-18 – 2023-05-20 (×2): 2 g via INTRAVENOUS
  Filled 2023-05-17 (×4): qty 20

## 2023-05-17 MED ORDER — ALBUTEROL SULFATE (2.5 MG/3ML) 0.083% IN NEBU: 2.5000 mg | INHALATION_SOLUTION | Freq: Three times a day (TID) | RESPIRATORY_TRACT | Status: DC

## 2023-05-17 MED ORDER — LACTATED RINGERS IV SOLN: INTRAVENOUS | Status: AC

## 2023-05-17 MED ORDER — ALBUTEROL SULFATE (2.5 MG/3ML) 0.083% IN NEBU: 2.5000 mg | INHALATION_SOLUTION | RESPIRATORY_TRACT | Status: DC | PRN

## 2023-05-17 MED ORDER — OSELTAMIVIR PHOSPHATE 75 MG PO CAPS
75.0000 mg | ORAL_CAPSULE | Freq: Two times a day (BID) | ORAL | Status: DC
  Administered 2023-05-17 – 2023-05-21 (×8): 75 mg via ORAL
  Filled 2023-05-17 (×9): qty 1

## 2023-05-17 MED ORDER — IOHEXOL 350 MG/ML SOLN
100.0000 mL | Freq: Once | INTRAVENOUS | Status: AC | PRN
  Administered 2023-05-17: 75 mL via INTRAVENOUS

## 2023-05-17 MED ORDER — ACETAMINOPHEN 500 MG PO TABS
1000.0000 mg | ORAL_TABLET | Freq: Four times a day (QID) | ORAL | Status: DC | PRN
  Administered 2023-05-18: 1000 mg via ORAL
  Filled 2023-05-17: qty 2

## 2023-05-17 MED ORDER — AZITHROMYCIN 500 MG PO TABS
500.0000 mg | ORAL_TABLET | Freq: Every day | ORAL | Status: AC
  Administered 2023-05-18 – 2023-05-19 (×2): 500 mg via ORAL
  Filled 2023-05-17 (×2): qty 1

## 2023-05-17 MED ORDER — BUSPIRONE HCL 5 MG PO TABS
15.0000 mg | ORAL_TABLET | Freq: Two times a day (BID) | ORAL | Status: DC
  Administered 2023-05-17 – 2023-05-21 (×8): 15 mg via ORAL
  Filled 2023-05-17 (×8): qty 1

## 2023-05-17 MED ORDER — MELATONIN 3 MG PO TABS
6.0000 mg | ORAL_TABLET | Freq: Every day | ORAL | Status: DC
  Administered 2023-05-17 – 2023-05-20 (×4): 6 mg via ORAL
  Filled 2023-05-17 (×4): qty 2

## 2023-05-17 MED ORDER — LAMOTRIGINE 25 MG PO TABS
150.0000 mg | ORAL_TABLET | Freq: Every day | ORAL | Status: DC
  Administered 2023-05-17 – 2023-05-20 (×4): 150 mg via ORAL
  Filled 2023-05-17 (×4): qty 2

## 2023-05-17 MED ORDER — ENOXAPARIN SODIUM 40 MG/0.4ML IJ SOSY
40.0000 mg | PREFILLED_SYRINGE | INTRAMUSCULAR | Status: DC
  Administered 2023-05-17 – 2023-05-20 (×4): 40 mg via SUBCUTANEOUS
  Filled 2023-05-17 (×4): qty 0.4

## 2023-05-17 MED ORDER — SODIUM CHLORIDE 0.9 % IN NEBU
3.0000 mL | INHALATION_SOLUTION | Freq: Three times a day (TID) | RESPIRATORY_TRACT | Status: DC
  Filled 2023-05-17 (×2): qty 3

## 2023-05-17 MED ORDER — METHOCARBAMOL 500 MG PO TABS
500.0000 mg | ORAL_TABLET | Freq: Three times a day (TID) | ORAL | Status: DC | PRN
  Administered 2023-05-17 – 2023-05-20 (×6): 500 mg via ORAL
  Filled 2023-05-17 (×6): qty 1

## 2023-05-17 MED ORDER — ESCITALOPRAM OXALATE 10 MG PO TABS
10.0000 mg | ORAL_TABLET | Freq: Every day | ORAL | Status: DC
  Administered 2023-05-18 – 2023-05-21 (×4): 10 mg via ORAL
  Filled 2023-05-17 (×4): qty 1

## 2023-05-17 MED ORDER — HYDROMORPHONE HCL 1 MG/ML IJ SOLN
0.5000 mg | INTRAMUSCULAR | Status: DC | PRN
  Administered 2023-05-17 – 2023-05-21 (×7): 0.5 mg via INTRAVENOUS
  Filled 2023-05-17 (×7): qty 0.5

## 2023-05-17 MED ORDER — HYDROMORPHONE HCL 1 MG/ML IJ SOLN
0.5000 mg | Freq: Once | INTRAMUSCULAR | Status: AC
  Administered 2023-05-17: 0.5 mg via INTRAVENOUS
  Filled 2023-05-17: qty 1

## 2023-05-17 MED ORDER — LACTATED RINGERS IV BOLUS
500.0000 mL | Freq: Once | INTRAVENOUS | Status: AC
  Administered 2023-05-17: 500 mL via INTRAVENOUS

## 2023-05-17 MED ORDER — SODIUM CHLORIDE 0.9 % IV SOLN
500.0000 mg | Freq: Once | INTRAVENOUS | Status: AC
  Administered 2023-05-17: 500 mg via INTRAVENOUS
  Filled 2023-05-17: qty 5

## 2023-05-17 MED ORDER — OXYCODONE HCL 5 MG PO TABS
5.0000 mg | ORAL_TABLET | ORAL | Status: DC | PRN
  Administered 2023-05-17 – 2023-05-21 (×9): 5 mg via ORAL
  Filled 2023-05-17 (×9): qty 1

## 2023-05-17 MED ORDER — OXYCODONE HCL 5 MG PO TABS: 2.5000 mg | ORAL_TABLET | ORAL | Status: DC | PRN

## 2023-05-17 MED ORDER — SODIUM CHLORIDE 0.9 % IV SOLN: 1.0000 g | INTRAVENOUS | Status: DC

## 2023-05-17 MED ORDER — SODIUM CHLORIDE 0.9 % IV SOLN
1.0000 g | Freq: Once | INTRAVENOUS | Status: AC
  Administered 2023-05-17: 1 g via INTRAVENOUS
  Filled 2023-05-17: qty 10

## 2023-05-17 MED ORDER — SODIUM CHLORIDE 0.9% FLUSH
3.0000 mL | Freq: Two times a day (BID) | INTRAVENOUS | Status: DC
  Administered 2023-05-17 – 2023-05-21 (×8): 3 mL via INTRAVENOUS

## 2023-05-17 NOTE — ED Provider Notes (Signed)
[facility] Provider Note   CSN: [id number] Arrival date & time: 05/17/23  1046     History  Chief Complaint  Patient presents with   Chest Pain    Patricia Mcmahon is a 38 y.o. female.  The history is provided by the patient, medical records and a relative. No language interpreter was used.  Chest Pain Pain location:  Substernal area Pain quality: crushing, dull, pressure, sharp and tightness   Pain radiates to:  Does not radiate Pain severity:  Severe Onset quality:  Gradual Duration:  2 days Timing:  Constant Progression:  Worsening Chronicity:  New Context: breathing   Relieved by:  Nothing Worsened by:  Coughing and deep breathing Ineffective treatments:  None tried Associated symptoms: cough, fatigue, fever (subjective), nausea, shortness of breath and vomiting   Associated symptoms: no abdominal pain, no back pain, no dizziness, no headache, no numbness, no palpitations and no weakness        Home Medications Prior to Admission medications   Medication Sig Start Date End Date Taking? Authorizing Provider  busPIRone (BUSPAR) 15 MG tablet Take 1 tablet (15 mg total) by mouth 2 (two) times daily. 04/24/23   Nwoko, Tommas Olp, PA  Cholecalciferol (VITAMIN D3) 25 MCG (1000 UT) CAPS Take 1 capsule (1,000 Units total) by mouth daily. 01/19/23   Clayborne Dana, NP  cyanocobalamin (VITAMIN B12) 1000 MCG tablet Take 1 tablet (1,000 mcg total) by mouth daily. 01/18/23   Clayborne Dana, NP  escitalopram (LEXAPRO) 10 MG tablet Take 1 tablet (10 mg total) by mouth daily. 03/07/23   Nwoko, Tommas Olp, PA  gabapentin (NEURONTIN) 100 MG capsule Take 1 capsule (100 mg total) by mouth 2 (two) times daily. 01/24/23 03/25/23  Meta Hatchet, PA  lamoTRIgine (LAMICTAL) 150 MG tablet Take 1 tablet (150 mg total) by mouth at bedtime. 04/24/23 06/23/23  Meta Hatchet, PA  levonorgestrel (MIRENA) 20 MCG/24HR IUD 1 each by Intrauterine route once.  Implanted 1st part of the year 2020    [provider]  Multiple Vitamins-Minerals (MULTIVITAMIN WITH MINERALS) tablet Take 1 tablet by mouth daily. 01/18/23   Clayborne Dana, NP  OLANZapine (ZYPREXA) 5 MG tablet Take 1 tablet (5 mg total) by mouth at bedtime. 04/24/23   Nwoko, Tommas Olp, PA  Vitamin D, Ergocalciferol, (DRISDOL) 1.25 MG (50000 UNIT) CAPS capsule Take 1 capsule (50,000 Units total) by mouth every 7 (seven) days. 12/01/22   Clayborne Dana, NP      Allergies    Morphine and codeine    Review of Systems   Review of Systems  Constitutional:  Positive for chills, fatigue and fever (subjective).  HENT:  Positive for congestion.   Eyes:  Negative for visual disturbance.  Respiratory:  Positive for cough, chest tightness and shortness of breath. Negative for wheezing.   Cardiovascular:  Positive for chest pain. Negative for palpitations.  Gastrointestinal:  Positive for nausea and vomiting. Negative for abdominal pain, constipation and diarrhea.  Genitourinary:  Negative for dysuria and frequency.  Musculoskeletal:  Negative for back pain, neck pain and neck stiffness.  Skin:  Negative for rash and wound.  Neurological:  Negative for dizziness, weakness, light-headedness, numbness and headaches.  Psychiatric/Behavioral:  Negative for agitation.   All other systems reviewed and are negative.   Physical Exam Updated Vital Signs BP 97/67 (BP Location: Left Arm)   Pulse (!) 123   Temp 98 F (36.7 C) (Oral)  Resp 20   Wt 74.8 kg   SpO2 94%   BMI 24.72 kg/m  Physical Exam Vitals and nursing note reviewed.  Constitutional:      General: She is not in acute distress.    Appearance: She is well-developed. She is not ill-appearing, toxic-appearing or diaphoretic.  HENT:     Head: Normocephalic and atraumatic.  Eyes:     Conjunctiva/sclera: Conjunctivae normal.     Pupils: Pupils are equal, round, and reactive to light.  Cardiovascular:     Rate and Rhythm: Regular  rhythm. Tachycardia present.     Heart sounds: Normal heart sounds. No murmur heard. Pulmonary:     Effort: Pulmonary effort is normal. Tachypnea present. No respiratory distress.     Breath sounds: Rhonchi present. No wheezing or rales.  Chest:     Chest wall: Tenderness present.  Abdominal:     Palpations: Abdomen is soft.     Tenderness: There is abdominal tenderness.  Musculoskeletal:        General: No swelling. Normal range of motion.     Cervical back: Neck supple.     Right lower leg: No tenderness. No edema.     Left lower leg: No tenderness. No edema.  Skin:    General: Skin is warm and dry.     Capillary Refill: Capillary refill takes less than 2 seconds.     Findings: No erythema.  Neurological:     General: No focal deficit present.     Mental Status: She is alert.  Psychiatric:        Mood and Affect: Mood normal.     ED Results / Procedures / Treatments   Labs (all labs ordered are listed, but only abnormal results are displayed) Labs Reviewed  RESP PANEL BY RT-PCR (RSV, FLU A&B, COVID)  RVPGX2 - Abnormal; Notable for the following components:      Result Value   Influenza A by PCR POSITIVE (*)    All other components within normal limits  BASIC METABOLIC PANEL - Abnormal; Notable for the following components:   Sodium 131 (*)    Chloride 96 (*)    Glucose, Bld 120 (*)    All other components within normal limits  CBC - Abnormal; Notable for the following components:   WBC 11.3 (*)    All other components within normal limits  HEPATIC FUNCTION PANEL - Abnormal; Notable for the following components:   Bilirubin, Direct 0.3 (*)    All other components within normal limits  LACTIC ACID, PLASMA - Abnormal; Notable for the following components:   Lactic Acid, Venous 2.6 (*)    All other components within normal limits  D-DIMER, QUANTITATIVE - Abnormal; Notable for the following components:   D-Dimer, Quant 1.79 (*)    All other components within normal  limits  URINALYSIS, W/ REFLEX TO CULTURE (INFECTION SUSPECTED) - Abnormal; Notable for the following components:   Color, Urine AMBER (*)    APPearance HAZY (*)    Bilirubin Urine SMALL (*)    Protein, ur 100 (*)    Bacteria, UA RARE (*)    All other components within normal limits  PREGNANCY, URINE  LIPASE, BLOOD  LACTIC ACID, PLASMA  TROPONIN I (HIGH SENSITIVITY)  TROPONIN I (HIGH SENSITIVITY)    EKG EKG Interpretation Date/Time:  Thursday May 17 2023 11:13:54 EST Ventricular Rate:  111 PR Interval:  126 QRS Duration:  68 QT Interval:  309 QTC Calculation: 420 R Axis:   80  Text Interpretation: Sinus tachycardia Probable left atrial enlargement RSR' in V1 or V2, probably normal variant no prior ECG for comparison No STEMI Confirmed by Theda Belfast ([id number]) on 05/17/2023 11:31:04 AM  Radiology DG Chest 2 View Result Date: 05/17/2023 CLINICAL DATA:  One day history of chest pain EXAM: CHEST - 2 VIEW COMPARISON:  Chest radiograph dated 08/21/2007 FINDINGS: Right middle and lower lobe consolidation. Patchy left basilar and lateral left mid lung opacities. Slightly asymmetrically lower right lung volumes. Questionable blunting of the right costophrenic angle. No pneumothorax. The heart size and mediastinal contours are within normal limits. No acute osseous abnormality. IMPRESSION: 1. Findings suspicious for multifocal pneumonia. Differential includes pulmonary infarct if there is history of hypercoagulability. 2. Questionable blunting of the right costophrenic angle, which may represent a small pleural effusion. Electronically Signed   By: Agustin Cree M.D.   On: 05/17/2023 12:15    Procedures Procedures    Medications Ordered in ED Medications  sodium chloride 0.9 % bolus 1,000 mL (1,000 mLs Intravenous New Bag/Given 05/17/23 1155)  ondansetron (ZOFRAN) injection 4 mg (4 mg Intravenous Given 05/17/23 1154)  HYDROmorphone (DILAUDID) injection 0.5 mg (0.5 mg Intravenous Given  05/17/23 1153)  HYDROmorphone (DILAUDID) injection 1 mg (1 mg Intravenous Given 05/17/23 1233)  sodium chloride 0.9 % bolus 1,000 mL (1,000 mLs Intravenous New Bag/Given 05/17/23 1223)  ondansetron (ZOFRAN) injection 4 mg (4 mg Intravenous Given 05/17/23 1324)  iohexol (OMNIPAQUE) 350 MG/ML injection 100 mL (75 mLs Intravenous Contrast Given 05/17/23 1423)    ED Course/ Medical Decision Making/ A&P                                 Medical Decision Making Amount and/or Complexity of Data Reviewed Labs: ordered. Radiology: ordered.  Risk Prescription drug management.    LONETTE STEVISON is a 38 y.o. female with a past medical history significant for previous sleeve gastrectomy, intussusception of intestine, previous pneumothorax, previous liver laceration, bipolar disorder, anxiety, and previous anemia who presents with several days of URI symptoms with worsening chest pain and shortness of breath.  According to patient, she had URI symptoms with cough for the last few days but has been having a productive like phlegm.  Denies vomitus.  Reports some nausea and dry heaving but denies any abdominal pain primary complaints of pain in her right chest and central chest.  She has had some subjective fevers and chills but not take her temperature.  Denies any constipation diarrhea or urinary changes.  Denies any trauma.  Denies rash to suggest shingles.  Denies other complaints.  On arrival, patient is warm to touch but is afebrile.  She is tachycardic and tachypneic and blood pressure is around 100 systolic.  EKG did not show STEMI.  On exam, patient did have tenderness to her central and right chest but did not have tenderness to her abdomen.  Good bowel sounds.  Lungs had some rhonchi but no rales or wheezing.  Back slightly tender on the right back.  No rash seen.  Good pulses in extremities.  Legs nontender nonedematous and she did not any leg symptoms.  She denies any personal or family history of  DVT or PE.  Likely I am concerned about pneumonia versus PE versus URI causing the symptoms.  Will get chest x-ray and D-dimer.  Will get troponin and labs.  Patient was given some fluids and pain medicine as he does appear  dehydrated with dry mucous membranes.     Workup began to return.  She does a leukocytosis and mild lactic acidosis.  She will continue fluids.  Hepatic function did not show LFT elevation.  Metabolic panel did not show AKI.  Patient is flu a positive.  Distal troponin negative, will trend.  Lipase normal.  D-dimer was elevated at 1.79, will get the CT PE study.  X-ray returned as well and showed concern for multifocal pneumonia versus pulmonary infarct.  Will wait for the CT results to he will determine disposition.  Anticipate reassessment after workup.  [name] workup continues to return.  Patient does have low sodium and chloride, fluids are ordered.  Urinalysis does not show nitrites or leukocytes, doubt UTI.  She is flu a positive.  Suspect this is the cause of symptoms however clinically still concerned about pneumonia.  Lactic acid elevated but downtrending.  Lipase normal.  She has a leukocytosis.  CT was ordered.  Care transferred oncoming team to wait for results of CT scan.  Suspect either PE versus postviral pneumonia.  Given her ill appearance and vital signs on arrival, anticipate she will likely need admission for antibiotics if this is multifocal pneumonia.  Care transferred in stable condition and she is not hypoxic.         Final Clinical Impression(s) / ED Diagnoses Final diagnoses:  Nonspecific chest pain  Influenza A  Pleuritic chest pain    Clinical Impression: 1. Nonspecific chest pain   2. Influenza A   3. Pleuritic chest pain     Disposition: Care transferred oncoming team to await results of CT scan.  Anticipate disposition after evaluation and imaging completion.  This note was prepared with assistance of Software engineer. Occasional wrong-word or sound-a-like substitutions may have occurred due to the inherent limitations of voice recognition software.      Thyra Yinger, Canary Brim, MD 05/17/23 [phone]

## 2023-05-17 NOTE — Consult Note (Signed)
Patricia Mcmahon [birth year]  [id number].    Requesting MD: Tereasa Coop Chief Complaint/Reason for Consult: rib fractures  HPI:  38 yo female admitted today for chest pain and worsened shortness of breath. She notes falling from a semi 2 weeks ago and landing on her right side. She had some pain in the same area and today but it was not as intense when she first fell. She denies other trauma. No abdominal pain.  ROS: Review of Systems  Constitutional: Negative.   HENT: Negative.    Eyes: Negative.   Respiratory:  Positive for shortness of breath.   Cardiovascular:  Positive for chest pain.  Gastrointestinal: Negative.   Genitourinary: Negative.   Musculoskeletal: Negative.   Skin: Negative.   Neurological: Negative.   Endo/Heme/Allergies: Negative.   Psychiatric/Behavioral: Negative.      Family History  Problem Relation Age of Onset   Alcohol abuse Mother    Cancer Mother    Learning disabilities Father    Alcohol abuse Father    Heart disease Maternal Grandmother    Diabetes Maternal Grandfather     Past Medical History:  Diagnosis Date   GERD (gastroesophageal reflux disease)    no current med.   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   Left knee pain 12/16/2012   Thrombocytopenia (HCC) 08/16/2011    Past Surgical History:  Procedure Laterality Date   CESAREAN SECTION     ESOPHAGOGASTRODUODENOSCOPY N/A 01/14/2021   Procedure: ESOPHAGOGASTRODUODENOSCOPY (EGD);  Surgeon: Willis Modena, MD;  Location: [facility];  Service: Endoscopy;  Laterality: N/A;   GASTRECTOMY     sleeve   KNEE ARTHROSCOPY  04/02/2012   Procedure: ARTHROSCOPY KNEE;  Surgeon: Velna Ochs, MD;  Location: [facility];  Service: Orthopedics;  Laterality: Left;  left knee arthroscopy chondroplasty   KNEE ARTHROSCOPY WITH LATERAL RELEASE Left 01/07/2013   Procedure: LEFT KNEE ARTHROSCOPY, CHONDROPLASTY AND  LATERAL RELEASE;  Surgeon: Velna Ochs, MD;  Location:  [facility];  Service: Orthopedics;  Laterality: Left;    Social History:  reports that she has been smoking cigarettes. She has a 16 pack-year smoking history. She has never used smokeless tobacco. She reports current alcohol use. She reports that she does not use drugs.  Allergies:  Allergies  Allergen Reactions   Morphine And Codeine Other (See Comments)    Behavior changes - becomes hostile/violent    Medications Prior to Admission  Medication Sig Dispense Refill   Acetaminophen (TYLENOL PO) Take 1,000 mg by mouth daily as needed. Gel Capsules     busPIRone (BUSPAR) 15 MG tablet Take 1 tablet (15 mg total) by mouth 2 (two) times daily. 60 tablet 1   Cholecalciferol (VITAMIN D3) 25 MCG (1000 UT) CAPS Take 1 capsule (1,000 Units total) by mouth daily. 90 capsule 3   cyanocobalamin (VITAMIN B12) 1000 MCG tablet Take 1 tablet (1,000 mcg total) by mouth daily. 90 tablet 1   escitalopram (LEXAPRO) 10 MG tablet Take 1 tablet (10 mg total) by mouth daily. 30 tablet 1   Ibuprofen (ADVIL PO) Take 2 capsules by mouth daily as needed. Gel Capsules     lamoTRIgine (LAMICTAL) 150 MG tablet Take 1 tablet (150 mg total) by mouth at bedtime. 30 tablet 1   Multiple Vitamins-Minerals (MULTIVITAMIN WITH MINERALS) tablet Take 1 tablet by mouth daily. 90 tablet 1   levonorgestrel (MIRENA) 20 MCG/24HR IUD 1 each by Intrauterine route once. Implanted 1st part of the year 2020 (Patient  not taking: Reported on 05/17/2023)      Physical Exam: Blood pressure (!) 114/97, pulse (!) 121, temperature 98.1 F (36.7 C), resp. rate 20, weight 74.8 kg, SpO2 96%. Gen: NAD Resp: nonlabor, hoarse voice and coughing CV: tachycardic Abd: soft, NT, ND Neuro: Aox4 Skin: no ecchymosis Ext: no swelling or ecchymosis or tenderness  Results for orders placed or performed during the hospital encounter of 05/17/23 (from the past 48 hours)  Resp panel by RT-PCR (RSV, Flu A&B, Covid) Anterior Nasal Swab      Status: Abnormal   Collection Time: 05/17/23 11:16 AM   Specimen: Anterior Nasal Swab  Result Value Ref Range   SARS Coronavirus 2 by RT PCR NEGATIVE NEGATIVE    Comment: (NOTE) SARS-CoV-2 target nucleic acids are NOT DETECTED.  The SARS-CoV-2 RNA is generally detectable in upper respiratory specimens during the acute phase of infection. The lowest concentration of SARS-CoV-2 viral copies this assay can detect is 138 copies/mL. A negative result does not preclude SARS-Cov-2 infection and should not be used as the sole basis for treatment or other patient management decisions. A negative result may occur with  improper specimen collection/handling, submission of specimen other than nasopharyngeal swab, presence of viral mutation(s) within the areas targeted by this assay, and inadequate number of viral copies(<138 copies/mL). A negative result must be combined with clinical observations, patient history, and epidemiological information. The expected result is Negative.  Fact Sheet for Patients:  BloggerCourse.com  Fact Sheet for Healthcare Providers:  SeriousBroker.it  This test is no t yet approved or cleared by the [place] FDA and  has been authorized for detection and/or diagnosis of SARS-CoV-2 by FDA under an Emergency Use Authorization (EUA). This EUA will remain  in effect (meaning this test can be used) for the duration of the COVID-19 declaration under Section 564(b)(1) of the Act, 21 U.S.C.section 360bbb-3(b)(1), unless the authorization is terminated  or revoked sooner.       Influenza A by PCR POSITIVE (A) NEGATIVE   Influenza B by PCR NEGATIVE NEGATIVE    Comment: (NOTE) The Xpert Xpress SARS-CoV-2/FLU/RSV plus assay is intended as an aid in the diagnosis of influenza from Nasopharyngeal swab specimens and should not be used as a sole basis for treatment. Nasal washings and aspirates are unacceptable for  Xpert Xpress SARS-CoV-2/FLU/RSV testing.  Fact Sheet for Patients: BloggerCourse.com  Fact Sheet for Healthcare Providers: SeriousBroker.it  This test is not yet approved or cleared by the [place] FDA and has been authorized for detection and/or diagnosis of SARS-CoV-2 by FDA under an Emergency Use Authorization (EUA). This EUA will remain in effect (meaning this test can be used) for the duration of the COVID-19 declaration under Section 564(b)(1) of the Act, 21 U.S.C. section 360bbb-3(b)(1), unless the authorization is terminated or revoked.     Resp Syncytial Virus by PCR NEGATIVE NEGATIVE    Comment: (NOTE) Fact Sheet for Patients: BloggerCourse.com  Fact Sheet for Healthcare Providers: SeriousBroker.it  This test is not yet approved or cleared by the [place] FDA and has been authorized for detection and/or diagnosis of SARS-CoV-2 by FDA under an Emergency Use Authorization (EUA). This EUA will remain in effect (meaning this test can be used) for the duration of the COVID-19 declaration under Section 564(b)(1) of the Act, 21 U.S.C. section 360bbb-3(b)(1), unless the authorization is terminated or revoked.  Performed at [facility], [address], [place], [place] [address]   Basic metabolic panel  Status: Abnormal   Collection Time: 05/17/23 11:22 AM  Result Value Ref Range   Sodium 131 (L) 135 - 145 mmol/L   Potassium 4.0 3.5 - 5.1 mmol/L   Chloride 96 (L) 98 - 111 mmol/L   CO2 26 22 - 32 mmol/L   Glucose, Bld 120 (H) 70 - 99 mg/dL    Comment: Glucose reference range applies only to samples taken after fasting for at least 8 hours.   BUN 11 6 - 20 mg/dL   Creatinine, Ser 4.09 0.44 - 1.00 mg/dL   Calcium 9.4 8.9 - 81.1 mg/dL   GFR, Estimated >91 >47 mL/min    Comment: (NOTE) Calculated using the CKD-EPI Creatinine Equation (2021)     Anion gap 9 5 - 15    Comment: Performed at [facility], [address] [place], [place] [address]  CBC     Status: Abnormal   Collection Time: 05/17/23 11:22 AM  Result Value Ref Range   WBC 11.3 (H) 4.0 - 10.5 K/uL   RBC 4.35 3.87 - 5.11 MIL/uL   Hemoglobin 14.0 12.0 - 15.0 g/dL   HCT 21.3 08.6 - 57.8 %   MCV 92.6 80.0 - 100.0 fL   MCH 32.2 26.0 - 34.0 pg   MCHC 34.7 30.0 - 36.0 g/dL   RDW 46.9 62.9 - 52.8 %   Platelets 183 150 - 400 K/uL   nRBC 0.0 0.0 - 0.2 %    Comment: Performed at [facility], [address] [place], [place] [address]  Troponin I (High Sensitivity)     Status: None   Collection Time: 05/17/23 11:22 AM  Result Value Ref Range   Troponin I (High Sensitivity) <2 <18 ng/L    Comment: (NOTE) Elevated high sensitivity troponin I (hsTnI) values and significant  changes across serial measurements may suggest ACS but many other  chronic and acute conditions are known to elevate hsTnI results.  Refer to the "Links" section for chest pain algorithms and additional  guidance. Performed at [facility], [address] [place], [place] [address]   Pregnancy, urine     Status: None   Collection Time: 05/17/23 11:22 AM  Result Value Ref Range   Preg Test, Ur NEGATIVE NEGATIVE    Comment:        THE SENSITIVITY OF THIS METHODOLOGY IS >25 mIU/mL. Performed at [facility], [address] [place], [place] [address]   Hepatic function panel     Status: Abnormal   Collection Time: 05/17/23 11:38 AM  Result Value Ref Range   Total Protein 7.4 6.5 - 8.1 g/dL   Albumin 3.8 3.5 - 5.0 g/dL   AST 26 15 - 41 U/L   ALT 19 0 - 44 U/L   Alkaline Phosphatase 55 38 - 126 U/L   Total Bilirubin 1.1 0.0 - 1.2 mg/dL   Bilirubin, Direct 0.3 (H) 0.0 - 0.2 mg/dL   Indirect Bilirubin 0.8 0.3 - 0.9 mg/dL    Comment: Performed at [facility], [address] [place], [place] [address]  Lipase, blood     Status: None    Collection Time: 05/17/23 11:38 AM  Result Value Ref Range   Lipase 22 11 - 51 U/L    Comment: Performed at [facility], [address] [place], [place] [address]  D-dimer, quantitative     Status: Abnormal   Collection Time: 05/17/23 11:38  AM  Result Value Ref Range   D-Dimer, Quant 1.79 (H) 0.00 - 0.50 ug/mL-FEU    Comment: (NOTE) At the manufacturer cut-off value of 0.5 g/mL FEU, this assay has a negative predictive value of 95-100%.This assay is intended for use in conjunction with a clinical pretest probability (PTP) assessment model to exclude pulmonary embolism (PE) and deep venous thrombosis (DVT) in outpatients suspected of PE or DVT. Results should be correlated with clinical presentation. Performed at [facility], [address] [place], [place] [address]   Lactic acid, plasma     Status: Abnormal   Collection Time: 05/17/23 11:39 AM  Result Value Ref Range   Lactic Acid, Venous 2.6 (HH) 0.5 - 1.9 mmol/L    Comment: CRITICAL RESULT CALLED TO, READ BACK BY AND VERIFIED WITH MARVA SIMMS RN AT 1224 ON 05/17/23 BY [email or web address] Performed at [facility], [address] [place], [place] [address]   Urinalysis, w/ Reflex to Culture (Infection Suspected) -Urine, Clean Catch     Status: Abnormal   Collection Time: 05/17/23 11:39 AM  Result Value Ref Range   Specimen Source URINE, CLEAN CATCH    Color, Urine AMBER (A) YELLOW    Comment: BIOCHEMICALS MAY BE AFFECTED BY COLOR   APPearance HAZY (A) CLEAR   Specific Gravity, Urine 1.025 1.005 - 1.030   pH 5.5 5.0 - 8.0   Glucose, UA NEGATIVE NEGATIVE mg/dL   Hgb urine dipstick NEGATIVE NEGATIVE   Bilirubin Urine SMALL (A) NEGATIVE   Ketones, ur NEGATIVE NEGATIVE mg/dL   Protein, ur 098 (A) NEGATIVE mg/dL   Nitrite NEGATIVE NEGATIVE   Leukocytes,Ua NEGATIVE NEGATIVE   Squamous Epithelial / HPF 0-5 0 - 5 /HPF   WBC, UA NONE SEEN 0 - 5 WBC/hpf    Comment: Reflex urine culture not performed if WBC  <=10, OR if Squamous epithelial cells >5. If Squamous epithelial cells >5, suggest recollection.   RBC / HPF 0-5 0 - 5 RBC/hpf   Bacteria, UA RARE (A) NONE SEEN   Mucus PRESENT    Hyaline Casts, UA PRESENT     Comment: Performed at [facility], [address] [place], [place] [address]  Troponin I (High Sensitivity)     Status: None   Collection Time: 05/17/23  1:05 PM  Result Value Ref Range   Troponin I (High Sensitivity) 2 <18 ng/L    Comment: (NOTE) Elevated high sensitivity troponin I (hsTnI) values and significant  changes across serial measurements may suggest ACS but many other  chronic and acute conditions are known to elevate hsTnI results.  Refer to the "Links" section for chest pain algorithms and additional  guidance. Performed at [facility], [address] [place], [place] [address]   Lactic acid, plasma     Status: None   Collection Time: 05/17/23  1:30 PM  Result Value Ref Range   Lactic Acid, Venous 1.9 0.5 - 1.9 mmol/L    Comment: Performed at [facility], [address] [place], [place] [address]   CT Angio Chest PE W and/or Wo Contrast Result Date: 05/17/2023 CLINICAL DATA:  Pulmonary embolism (PE) suspected, low to intermediate prob, positive D-dimer. Leukocytosis EXAM: CT ANGIOGRAPHY CHEST WITH CONTRAST TECHNIQUE: Multidetector CT imaging of the chest was performed using the standard protocol during bolus administration of intravenous contrast. Multiplanar CT image reconstructions and MIPs were obtained to evaluate the vascular anatomy. RADIATION DOSE REDUCTION: This exam  was performed according to the departmental dose-optimization program which includes automated exposure control, adjustment of the mA and/or kV according to patient size and/or use of iterative reconstruction technique. CONTRAST:  75mL OMNIPAQUE IOHEXOL 350 MG/ML SOLN COMPARISON:  Same-day x-ray FINDINGS: Cardiovascular: Satisfactory opacification of the  pulmonary arteries to the segmental level. No evidence of pulmonary embolism. Thoracic aorta is normal in course and caliber. Normal heart size. No pericardial effusion. Mediastinum/Nodes: No axillary lymphadenopathy. Mildly enlarged subcarinal and right hilar lymph nodes, likely reactive. Small left hilar nodes. Thyroid gland, trachea, and esophagus within normal limits. Lungs/Pleura: Dense airspace consolidation within the right lower lobe. Additional patchy airspace opacities within the lingula and left lower lobes. Occlusion of the right lower lobe bronchus. No pleural effusion or pneumothorax. Upper Abdomen: No acute abnormality. Musculoskeletal: Acute or subacute nondisplaced fractures of the lateral right eighth, ninth, and tenth ribs. Healing nondisplaced fractures of the left fourth, sixth, eighth, and ninth ribs. No chest wall abnormality. No subcutaneous emphysema. Review of the MIP images confirms the above findings. IMPRESSION: 1. No evidence of pulmonary embolism. 2. Dense airspace consolidation within the right lower lobe. Additional patchy airspace opacities within the lingula and left lower lobes. Findings are most compatible with multifocal pneumonia. 3. Occlusion of the right lower lobe bronchus, which may be secondary to mucous plugging/aspiration. 4. Acute or subacute nondisplaced fractures of the lateral right eighth, ninth, and tenth ribs. Healing nondisplaced fractures of the left fourth, sixth, eighth, and ninth ribs. Electronically Signed   By: Duanne Guess D.O.   On: 05/17/2023 15:30   DG Chest 2 View Result Date: 05/17/2023 CLINICAL DATA:  One day history of chest pain EXAM: CHEST - 2 VIEW COMPARISON:  Chest radiograph dated 08/21/2007 FINDINGS: Right middle and lower lobe consolidation. Patchy left basilar and lateral left mid lung opacities. Slightly asymmetrically lower right lung volumes. Questionable blunting of the right costophrenic angle. No pneumothorax. The heart size and  mediastinal contours are within normal limits. No acute osseous abnormality. IMPRESSION: 1. Findings suspicious for multifocal pneumonia. Differential includes pulmonary infarct if there is history of hypercoagulability. 2. Questionable blunting of the right costophrenic angle, which may represent a small pleural effusion. Electronically Signed   By: Agustin Cree M.D.   On: 05/17/2023 12:15    Assessment/Plan 38 yo female 2 weeks out from fall with 3 rib fractures. Worsened pain not is atypical of rib fractures. -multimodal pain control  FEN - diet per primary VTE - enoxaparin ID - azithromycin, ceftriaxone, tamiflu  I reviewed last 24 h vitals and pain scores, last 48 h intake and output, last 24 h labs and trends, and last 24 h imaging results.  [facility] 05/17/2023, 10:01 PM Please see Amion for pager number during day hours 7:00am-4:30pm or 7:00am -11:30am on weekends

## 2023-05-17 NOTE — ED Provider Notes (Addendum)
Patient turned over to me awaiting CT angio chest results.  [name] D-dimer was elevated.  Patient presented with a lactic acid of 2.6 but then it did improve to 1.9 with 1 L of fluid.  CT angio was negative for any pulmonary embolus but did show multifocal pneumonia but surprisingly did show also bilateral multiple rib fractures little bit different stages of healing.  Patient denies any assault or injury which is somewhat suspicious.  Based on the the rib fractures and the multifocal pneumonia will start Rocephin and Zithromax and will recommend admission.  Will contact hospitalist for admission.  Patient is tachycardic but not hypotensive not showing evidence of hypoxia.  [name] white blood cell count was 11.3 hemoglobin was 14.0.  Patient was positive for influenza A which is probably the main source of her illness.  In addition patient does have a history of bipolar disorder so there is a history of some behavioral health issues.   Vanetta Mulders, MD 05/17/23 0454    Vanetta Mulders, MD 05/17/23 [phone]

## 2023-05-17 NOTE — ED Notes (Signed)
Patient transported to X-ray

## 2023-05-17 NOTE — Plan of Care (Signed)

## 2023-05-17 NOTE — ED Notes (Signed)
Pt given another icepack for incontractible rib pain.

## 2023-05-17 NOTE — Telephone Encounter (Signed)
Chief Complaint: shortness of breath Symptoms: SOB even at rest, fever, chills, diaphoresis, chest pain, pain in the upper body Frequency: onset of symptoms yesterday afternoon Pertinent Negatives: Patient denies N/V/D, passing out Disposition: [x] ED /[] Urgent Care (no appt availability in office) / [] Appointment(In office/virtual)/ []  [facility]/ [] Home Care/ [] Refused Recommended Disposition /[] [place] [facility]/ []  Follow-up with PCP Additional Notes: Pt reports SOB since yesterday. Pt reports SOB even at rest. Pt reports chest pain that started yesterday as well. Pt rates pain 8/10 with severe muscle cramping in the upper body. Pt endorses diaphoresis as well. Per protocol, pt advised to go to the ED. Pt states her boyfriend will take her and that they will leave now. RN advised pt to call 911 if she worsens before leaving the house. Pt verbalized understanding.   Copied from CRM [id number]. Topic: Clinical - Red Word Triage >> May 17, 2023  8:53 AM Irine Seal wrote: [facility] that prompted transfer to Nurse Triage: patient seeking same day acute visit, she is experiencing sweating, chills, upper body cramping, difficulty breathing.  Symptoms presented 24 hours ago. Reason for Disposition  [1] MODERATE difficulty breathing (e.g., speaks in phrases, SOB even at rest, pulse 100-120) AND [2] NEW-onset or WORSE than normal  Answer Assessment - Initial Assessment Questions 1. RESPIRATORY STATUS: "Describe your breathing?" (e.g., wheezing, shortness of breath, unable to speak, severe coughing)      Shortness of breath that comes and goes - "not doing anything" - "yesterday at work it felt painful, hard time catching breath." "Getting hot." Pain in middle of chest to top of arm and up to neck. Now it feels like severe muscle cramping at the top of the body. I'm hot, I'm cold. 2. ONSET: "When did this breathing problem begin?"      Yesterday afternoon 3. PATTERN "Does the difficult  breathing come and go, or has it been constant since it started?"      Intermittent 4. SEVERITY: "How bad is your breathing?" (e.g., mild, moderate, severe)    - MILD: No SOB at rest, mild SOB with walking, speaks normally in sentences, can lie down, no retractions, pulse < 100.    - MODERATE: SOB at rest, SOB with minimal exertion and prefers to sit, cannot lie down flat, speaks in phrases, mild retractions, audible wheezing, pulse 100-120.    - SEVERE: Very SOB at rest, speaks in single words, struggling to breathe, sitting hunched forward, retractions, pulse > 120      "Not normal" 5. RECURRENT SYMPTOM: "Have you had difficulty breathing before?" If Yes, ask: "When was the last time?" and "What happened that time?"      No 6. CARDIAC HISTORY: "Do you have any history of heart disease?" (e.g., heart attack, angina, bypass surgery, angioplasty)      No 7. LUNG HISTORY: "Do you have any history of lung disease?"  (e.g., pulmonary embolus, asthma, emphysema)     No 8. CAUSE: "What do you think is causing the breathing problem?"      Not sure 9. OTHER SYMPTOMS: "Do you have any other symptoms? (e.g., dizziness, runny nose, cough, chest pain, fever)     Chest pain (8/10), feverish, runny nose and cough for a couple days, sweaty 10. O2 SATURATION MONITOR:  "Do you use an oxygen saturation monitor (pulse oximeter) at home?" If Yes, ask: "What is your reading (oxygen level) today?" "What is your usual oxygen saturation reading?" (e.g., 95%)  No 11. PREGNANCY: "Is there any chance you are pregnant?" "When was your last menstrual period?"       No 12. TRAVEL: "Have you traveled out of the country in the last month?" (e.g., travel history, exposures)       No  Protocols used: Breathing Difficulty-A-AH

## 2023-05-17 NOTE — Progress Notes (Signed)
[name] RN reported that respiratory panel showing influenza A positive.  I have placed Tamiflu 75 mg twice daily to complete 5 days course.  Continue droplet precaution.   Patricia Coop, MD [facility] 05/17/2023, 8:58 PM

## 2023-05-17 NOTE — ED Notes (Signed)
Called [facility] for transfer @19 :10.  Spoke with [employer]
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
## 2023-05-17 NOTE — ED Notes (Signed)
Pt advised her pain is way worse than before.

## 2023-05-17 NOTE — ED Triage Notes (Signed)
Mid chest pain radiating to right side started yesterday  , shortness of breath , had cough and URI 2 days prior . Obvious distress .

## 2023-05-17 NOTE — ED Notes (Signed)
Pt very anxious in bed, abd and chest hurting. Hard to console. Occasional dryheave.

## 2023-05-18 DIAGNOSIS — T17890A Other foreign object in other parts of respiratory tract causing asphyxiation, initial encounter: Secondary | ICD-10-CM | POA: Diagnosis not present

## 2023-05-18 DIAGNOSIS — R0781 Pleurodynia: Secondary | ICD-10-CM | POA: Diagnosis not present

## 2023-05-18 DIAGNOSIS — Z9884 Bariatric surgery status: Secondary | ICD-10-CM | POA: Diagnosis not present

## 2023-05-18 DIAGNOSIS — J129 Viral pneumonia, unspecified: Secondary | ICD-10-CM

## 2023-05-18 DIAGNOSIS — S2241XA Multiple fractures of ribs, right side, initial encounter for closed fracture: Secondary | ICD-10-CM | POA: Diagnosis not present

## 2023-05-18 DIAGNOSIS — R079 Chest pain, unspecified: Secondary | ICD-10-CM | POA: Diagnosis not present

## 2023-05-18 DIAGNOSIS — J159 Unspecified bacterial pneumonia: Secondary | ICD-10-CM | POA: Diagnosis not present

## 2023-05-18 DIAGNOSIS — Z79899 Other long term (current) drug therapy: Secondary | ICD-10-CM | POA: Diagnosis not present

## 2023-05-18 DIAGNOSIS — R0989 Other specified symptoms and signs involving the circulatory and respiratory systems: Secondary | ICD-10-CM | POA: Diagnosis not present

## 2023-05-18 DIAGNOSIS — R197 Diarrhea, unspecified: Secondary | ICD-10-CM | POA: Diagnosis not present

## 2023-05-18 DIAGNOSIS — R918 Other nonspecific abnormal finding of lung field: Secondary | ICD-10-CM | POA: Diagnosis not present

## 2023-05-18 DIAGNOSIS — F17210 Nicotine dependence, cigarettes, uncomplicated: Secondary | ICD-10-CM | POA: Diagnosis not present

## 2023-05-18 DIAGNOSIS — R0603 Acute respiratory distress: Secondary | ICD-10-CM | POA: Diagnosis not present

## 2023-05-18 DIAGNOSIS — J1008 Influenza due to other identified influenza virus with other specified pneumonia: Secondary | ICD-10-CM | POA: Diagnosis not present

## 2023-05-18 DIAGNOSIS — I2699 Other pulmonary embolism without acute cor pulmonale: Secondary | ICD-10-CM | POA: Diagnosis not present

## 2023-05-18 DIAGNOSIS — W1789XA Other fall from one level to another, initial encounter: Secondary | ICD-10-CM | POA: Diagnosis present

## 2023-05-18 DIAGNOSIS — Z833 Family history of diabetes mellitus: Secondary | ICD-10-CM | POA: Diagnosis not present

## 2023-05-18 DIAGNOSIS — E876 Hypokalemia: Secondary | ICD-10-CM | POA: Diagnosis not present

## 2023-05-18 DIAGNOSIS — K219 Gastro-esophageal reflux disease without esophagitis: Secondary | ICD-10-CM | POA: Diagnosis not present

## 2023-05-18 DIAGNOSIS — J101 Influenza due to other identified influenza virus with other respiratory manifestations: Secondary | ICD-10-CM | POA: Diagnosis not present

## 2023-05-18 DIAGNOSIS — Z8249 Family history of ischemic heart disease and other diseases of the circulatory system: Secondary | ICD-10-CM | POA: Diagnosis not present

## 2023-05-18 DIAGNOSIS — E872 Acidosis, unspecified: Secondary | ICD-10-CM | POA: Diagnosis not present

## 2023-05-18 DIAGNOSIS — R0789 Other chest pain: Secondary | ICD-10-CM | POA: Diagnosis not present

## 2023-05-18 DIAGNOSIS — F319 Bipolar disorder, unspecified: Secondary | ICD-10-CM | POA: Diagnosis not present

## 2023-05-18 DIAGNOSIS — A419 Sepsis, unspecified organism: Secondary | ICD-10-CM | POA: Diagnosis not present

## 2023-05-18 DIAGNOSIS — J189 Pneumonia, unspecified organism: Secondary | ICD-10-CM | POA: Diagnosis not present

## 2023-05-18 DIAGNOSIS — Z811 Family history of alcohol abuse and dependence: Secondary | ICD-10-CM | POA: Diagnosis not present

## 2023-05-18 LAB — CBC
HCT: 33.9 % — ABNORMAL LOW (ref 36.0–46.0)
Hemoglobin: 11.8 g/dL — ABNORMAL LOW (ref 12.0–15.0)
MCH: 32.4 pg (ref 26.0–34.0)
MCHC: 34.8 g/dL (ref 30.0–36.0)
MCV: 93.1 fL (ref 80.0–100.0)
Platelets: 152 10*3/uL (ref 150–400)
RBC: 3.64 MIL/uL — ABNORMAL LOW (ref 3.87–5.11)
RDW: 12.3 % (ref 11.5–15.5)
WBC: 7.1 10*3/uL (ref 4.0–10.5)
nRBC: 0 % (ref 0.0–0.2)

## 2023-05-18 LAB — RESPIRATORY PANEL BY PCR

## 2023-05-18 LAB — MAGNESIUM: Magnesium: 1.6 mg/dL — ABNORMAL LOW (ref 1.7–2.4)

## 2023-05-18 LAB — BASIC METABOLIC PANEL
Anion gap: 9 (ref 5–15)
BUN: 7 mg/dL (ref 6–20)
CO2: 25 mmol/L (ref 22–32)
Calcium: 9.1 mg/dL (ref 8.9–10.3)
Chloride: 100 mmol/L (ref 98–111)
Creatinine, Ser: 0.8 mg/dL (ref 0.44–1.00)
GFR, Estimated: >60 mL/min (ref >60)
Glucose, Bld: 106 mg/dL — ABNORMAL HIGH (ref 70–99)
Potassium: 3.4 mmol/L — ABNORMAL LOW (ref 3.5–5.1)
Sodium: 134 mmol/L — ABNORMAL LOW (ref 135–145)

## 2023-05-18 LAB — PHOSPHORUS: Phosphorus: 2.2 mg/dL — ABNORMAL LOW (ref 2.5–4.6)

## 2023-05-18 LAB — HIV ANTIBODY (ROUTINE TESTING W REFLEX): HIV Screen 4th Generation wRfx: NONREACTIVE

## 2023-05-18 MED ORDER — ONDANSETRON HCL 4 MG/2ML IJ SOLN
4.0000 mg | Freq: Once | INTRAMUSCULAR | Status: AC
  Administered 2023-05-18: 4 mg via INTRAVENOUS
  Filled 2023-05-18: qty 2

## 2023-05-18 MED ORDER — LIDOCAINE 5 % EX PTCH
1.0000 | MEDICATED_PATCH | CUTANEOUS | Status: DC
  Administered 2023-05-18 – 2023-05-21 (×4): 1 via TRANSDERMAL
  Filled 2023-05-18 (×4): qty 1

## 2023-05-18 MED ORDER — GUAIFENESIN ER 600 MG PO TB12
600.0000 mg | ORAL_TABLET | Freq: Two times a day (BID) | ORAL | Status: DC
Start: 2023-05-18 — End: 2023-05-21
  Administered 2023-05-18 – 2023-05-21 (×7): 600 mg via ORAL
  Filled 2023-05-18 (×7): qty 1

## 2023-05-18 MED ORDER — MAGNESIUM SULFATE 2 GM/50ML IV SOLN
2.0000 g | Freq: Once | INTRAVENOUS | Status: AC
  Administered 2023-05-18: 2 g via INTRAVENOUS
  Filled 2023-05-18: qty 50

## 2023-05-18 MED ORDER — ONDANSETRON HCL 4 MG/2ML IJ SOLN
4.0000 mg | Freq: Three times a day (TID) | INTRAMUSCULAR | Status: AC | PRN
  Administered 2023-05-18 – 2023-05-19 (×3): 4 mg via INTRAVENOUS
  Filled 2023-05-18 (×3): qty 2

## 2023-05-18 MED ORDER — ALBUTEROL SULFATE (2.5 MG/3ML) 0.083% IN NEBU
2.5000 mg | INHALATION_SOLUTION | Freq: Two times a day (BID) | RESPIRATORY_TRACT | Status: DC
Start: 2023-05-19 — End: 2023-05-20
  Administered 2023-05-19 – 2023-05-20 (×2): 2.5 mg via RESPIRATORY_TRACT
  Filled 2023-05-18 (×2): qty 3

## 2023-05-18 MED ORDER — ALBUTEROL SULFATE (2.5 MG/3ML) 0.083% IN NEBU
2.5000 mg | INHALATION_SOLUTION | Freq: Three times a day (TID) | RESPIRATORY_TRACT | Status: DC
  Administered 2023-05-18: 2.5 mg via RESPIRATORY_TRACT
  Filled 2023-05-18: qty 3

## 2023-05-18 MED ORDER — K PHOS MONO-SOD PHOS DI & MONO 155-852-130 MG PO TABS
250.0000 mg | ORAL_TABLET | Freq: Four times a day (QID) | ORAL | Status: AC
  Administered 2023-05-18 – 2023-05-19 (×4): 250 mg via ORAL
  Filled 2023-05-18 (×3): qty 1

## 2023-05-18 NOTE — Plan of Care (Signed)

## 2023-05-18 NOTE — Plan of Care (Signed)
  Problem: Education: Goal: Knowledge of General Education information will improve Description: Including pain rating scale, medication(s)/side effects and non-pharmacologic comfort measures Outcome: Progressing   Problem: Health Behavior/Discharge Planning: Goal: Ability to manage health-related needs will improve Outcome: Progressing   Problem: Clinical Measurements: Goal: Ability to maintain clinical measurements within normal limits will improve Outcome: Progressing Goal: Will remain free from infection Outcome: Progressing Goal: Diagnostic test results will improve Outcome: Progressing Goal: Cardiovascular complication will be avoided Outcome: Progressing   Problem: Elimination: Goal: Will not experience complications related to bowel motility Outcome: Progressing Goal: Will not experience complications related to urinary retention Outcome: Progressing   Problem: Safety: Goal: Ability to remain free from injury will improve Outcome: Progressing   Problem: Skin Integrity: Goal: Risk for impaired skin integrity will decrease Outcome: Progressing   Problem: Clinical Measurements: Goal: Respiratory complications will improve Outcome: Not Progressing   Problem: Activity: Goal: Risk for activity intolerance will decrease Outcome: Not Progressing   Problem: Nutrition: Goal: Adequate nutrition will be maintained Outcome: Not Progressing   Problem: Coping: Goal: Level of anxiety will decrease Outcome: Not Progressing   Problem: Pain Managment: Goal: General experience of comfort will improve and/or be controlled Outcome: Not Progressing
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
## 2023-05-18 NOTE — Progress Notes (Signed)
Progress Note     Subjective: Pt reports more central chest pain with deep inspiration. Having some pain from rib fractures as expected but overall feels pain is well managed with medications. Discussed utilizing PO and non-narcotic meds as much as possible. She is having a lot of phlegm that feels difficult to bring up at times. No IS in room.   Objective: Vital signs in last 24 hours: Temp:  [97.9 F (36.6 C)-100.4 F (38 C)] 97.9 F (36.6 C) (01/31 0756) Pulse Rate:  [98-124] 98 (01/31 0756) Resp:  [18-32] 18 (01/31 0756) BP: (90-114)/(62-97) 95/65 (01/31 0756) SpO2:  [93 %-98 %] 96 % (01/31 0756) Weight:  [74.8 kg] 74.8 kg (01/30 2302) Last BM Date : 05/16/23  Intake/Output from previous day: 01/30 0701 - 01/31 0700 In: 2231.4 [IV Piggyback:2231.4] Out: -  Intake/Output this shift: No intake/output data recorded.  PE: General: pleasant, WD, WN female who is laying in bed in NAD HEENT: head is normocephalic, atraumatic.  Sclera are noninjected.  Pupils equal and round, EOMI.  Ears and nose without any masses or lesions.  Mouth is pink and moist Heart: regular, rate, and rhythm. Palpable radial pulses bilaterally Lungs: CTAB, no wheezes, rhonchi, or rales noted.  Respiratory effort nonlabored Abd: soft, NT, ND MS: all 4 extremities are symmetrical with no cyanosis, clubbing, or edema. Skin: warm and dry with no masses, lesions, or rashes Neuro: Cranial nerves 2-12 grossly intact, sensation is normal throughout Psych: A&Ox3 with an appropriate affect.    Lab Results:  Recent Labs    05/17/23 1122  WBC 11.3*  HGB 14.0  HCT 40.3  PLT 183   BMET Recent Labs    05/17/23 1122  NA 131*  K 4.0  CL 96*  CO2 26  GLUCOSE 120*  BUN 11  CREATININE 0.99  CALCIUM 9.4   PT/INR No results for input(s): "LABPROT", "INR" in the last 72 hours. CMP     Component Value Date/Time   NA 131 (L) 05/17/2023 1122   K 4.0 05/17/2023 1122   CL 96 (L) 05/17/2023 1122   CO2  26 05/17/2023 1122   GLUCOSE 120 (H) 05/17/2023 1122   BUN 11 05/17/2023 1122   CREATININE 0.99 05/17/2023 1122   CALCIUM 9.4 05/17/2023 1122   PROT 7.4 05/17/2023 1138   ALBUMIN 3.8 05/17/2023 1138   AST 26 05/17/2023 1138   ALT 19 05/17/2023 1138   ALKPHOS 55 05/17/2023 1138   BILITOT 1.1 05/17/2023 1138   GFRNONAA >60 05/17/2023 1122   GFRAA >60 01/16/2019 1608   Lipase     Component Value Date/Time   LIPASE 22 05/17/2023 1138       Studies/Results: CT Angio Chest PE W and/or Wo Contrast Result Date: 05/17/2023 CLINICAL DATA:  Pulmonary embolism (PE) suspected, low to intermediate prob, positive D-dimer. Leukocytosis EXAM: CT ANGIOGRAPHY CHEST WITH CONTRAST TECHNIQUE: Multidetector CT imaging of the chest was performed using the standard protocol during bolus administration of intravenous contrast. Multiplanar CT image reconstructions and MIPs were obtained to evaluate the vascular anatomy. RADIATION DOSE REDUCTION: This exam was performed according to the departmental dose-optimization program which includes automated exposure control, adjustment of the mA and/or kV according to patient size and/or use of iterative reconstruction technique. CONTRAST:  75mL OMNIPAQUE IOHEXOL 350 MG/ML SOLN COMPARISON:  Same-day x-ray FINDINGS: Cardiovascular: Satisfactory opacification of the pulmonary arteries to the segmental level. No evidence of pulmonary embolism. Thoracic aorta is normal in course and caliber. Normal heart size. No  pericardial effusion. Mediastinum/Nodes: No axillary lymphadenopathy. Mildly enlarged subcarinal and right hilar lymph nodes, likely reactive. Small left hilar nodes. Thyroid gland, trachea, and esophagus within normal limits. Lungs/Pleura: Dense airspace consolidation within the right lower lobe. Additional patchy airspace opacities within the lingula and left lower lobes. Occlusion of the right lower lobe bronchus. No pleural effusion or pneumothorax. Upper Abdomen: No  acute abnormality. Musculoskeletal: Acute or subacute nondisplaced fractures of the lateral right eighth, ninth, and tenth ribs. Healing nondisplaced fractures of the left fourth, sixth, eighth, and ninth ribs. No chest wall abnormality. No subcutaneous emphysema. Review of the MIP images confirms the above findings. IMPRESSION: 1. No evidence of pulmonary embolism. 2. Dense airspace consolidation within the right lower lobe. Additional patchy airspace opacities within the lingula and left lower lobes. Findings are most compatible with multifocal pneumonia. 3. Occlusion of the right lower lobe bronchus, which may be secondary to mucous plugging/aspiration. 4. Acute or subacute nondisplaced fractures of the lateral right eighth, ninth, and tenth ribs. Healing nondisplaced fractures of the left fourth, sixth, eighth, and ninth ribs. Electronically Signed   By: Duanne Guess D.O.   On: 05/17/2023 15:30   DG Chest 2 View Result Date: 05/17/2023 CLINICAL DATA:  One day history of chest pain EXAM: CHEST - 2 VIEW COMPARISON:  Chest radiograph dated 08/21/2007 FINDINGS: Right middle and lower lobe consolidation. Patchy left basilar and lateral left mid lung opacities. Slightly asymmetrically lower right lung volumes. Questionable blunting of the right costophrenic angle. No pneumothorax. The heart size and mediastinal contours are within normal limits. No acute osseous abnormality. IMPRESSION: 1. Findings suspicious for multifocal pneumonia. Differential includes pulmonary infarct if there is history of hypercoagulability. 2. Questionable blunting of the right costophrenic angle, which may represent a small pleural effusion. Electronically Signed   By: Agustin Cree M.D.   On: 05/17/2023 12:15    Anti-infectives: Anti-infectives (From admission, onward)    Start     Dose/Rate Route Frequency Ordered Stop   05/18/23 2200  cefTRIAXone (ROCEPHIN) 1 g in sodium chloride 0.9 % 100 mL IVPB  Status:  Discontinued         1 g 200 mL/hr over 30 Minutes Intravenous Every 24 hours 05/17/23 2055 05/17/23 2057   05/18/23 2200  azithromycin (ZITHROMAX) tablet 500 mg        500 mg Oral Daily at bedtime 05/17/23 2055 05/20/23 2159   05/18/23 2200  cefTRIAXone (ROCEPHIN) 2 g in sodium chloride 0.9 % 100 mL IVPB        2 g 200 mL/hr over 30 Minutes Intravenous Every 24 hours 05/17/23 2057 05/22/23 2159   05/17/23 2200  oseltamivir (TAMIFLU) capsule 75 mg        75 mg Oral 2 times daily 05/17/23 2045 05/22/23 2159   05/17/23 1615  cefTRIAXone (ROCEPHIN) 1 g in sodium chloride 0.9 % 100 mL IVPB        1 g 200 mL/hr over 30 Minutes Intravenous  Once 05/17/23 1601 05/17/23 1716   05/17/23 1615  azithromycin (ZITHROMAX) 500 mg in sodium chloride 0.9 % 250 mL IVPB        500 mg 250 mL/hr over 60 Minutes Intravenous  Once 05/17/23 1601 05/17/23 1827        Assessment/Plan  Fall 2-3 weeks ago  Right 8-10 rib fractures - multimodal pain control, IS, pulm toilet - added mucinex to help thin phlegm  - added lidocaine patch for pain control, if struggling with pain control would recommend  increasing robaxin to 1000 mg TID, could also increase oxy scale to 5-10 mg q4h prn if needed  Subacute left 4,6,8,9 rib fractures - healing on CT  No other recommendations from a trauma standpoint, we will sign off at this time.   FEN: reg diet, IVF per primary  VTE: LMWH ID: rocephin/azithro, tamiflu  - per [facility] -  Influenza A  Tobacco abuse - advised cessation GERD Hx of sleeve gastrectomy    LOS: 0 days   I reviewed hospitalist notes, last 24 h vitals and pain scores, last 48 h intake and output, last 24 h labs and trends, and last 24 h imaging results.  This care required moderate level of medical decision making.    Juliet Rude, [facility] 05/18/2023, 8:07 AM Please see Amion for pager number during day hours 7:00am-4:30pm

## 2023-05-19 DIAGNOSIS — J189 Pneumonia, unspecified organism: Secondary | ICD-10-CM | POA: Diagnosis not present

## 2023-05-19 LAB — RENAL FUNCTION PANEL
Albumin: 2.2 g/dL — ABNORMAL LOW (ref 3.5–5.0)
Anion gap: 10 (ref 5–15)
BUN: 8 mg/dL (ref 6–20)
CO2: 23 mmol/L (ref 22–32)
Calcium: 8.8 mg/dL — ABNORMAL LOW (ref 8.9–10.3)
Chloride: 101 mmol/L (ref 98–111)
Creatinine, Ser: 0.81 mg/dL (ref 0.44–1.00)
GFR, Estimated: >60 mL/min (ref >60)
Glucose, Bld: 104 mg/dL — ABNORMAL HIGH (ref 70–99)
Phosphorus: 2.2 mg/dL — ABNORMAL LOW (ref 2.5–4.6)
Potassium: 3.2 mmol/L — ABNORMAL LOW (ref 3.5–5.1)
Sodium: 134 mmol/L — ABNORMAL LOW (ref 135–145)

## 2023-05-19 LAB — MAGNESIUM: Magnesium: 2 mg/dL (ref 1.7–2.4)

## 2023-05-19 MED ORDER — POTASSIUM CHLORIDE 20 MEQ PO PACK
40.0000 meq | PACK | ORAL | Status: AC
Start: 2023-05-19 — End: 2023-05-19
  Administered 2023-05-19 (×2): 40 meq via ORAL
  Filled 2023-05-19 (×2): qty 2

## 2023-05-19 MED ORDER — K PHOS MONO-SOD PHOS DI & MONO 155-852-130 MG PO TABS
500.0000 mg | ORAL_TABLET | Freq: Three times a day (TID) | ORAL | Status: AC
  Administered 2023-05-19 – 2023-05-20 (×3): 500 mg via ORAL
  Filled 2023-05-19 (×3): qty 2

## 2023-05-19 MED ORDER — ONDANSETRON HCL 4 MG/2ML IJ SOLN
4.0000 mg | Freq: Four times a day (QID) | INTRAMUSCULAR | Status: DC | PRN
  Administered 2023-05-20 – 2023-05-21 (×2): 4 mg via INTRAVENOUS
  Filled 2023-05-19 (×2): qty 2

## 2023-05-19 MED ORDER — ONDANSETRON HCL 4 MG/2ML IJ SOLN
4.0000 mg | Freq: Once | INTRAMUSCULAR | Status: AC
  Administered 2023-05-19: 4 mg via INTRAVENOUS
  Filled 2023-05-19: qty 2
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
## 2023-05-19 NOTE — Plan of Care (Signed)

## 2023-05-20 DIAGNOSIS — J189 Pneumonia, unspecified organism: Secondary | ICD-10-CM | POA: Diagnosis not present

## 2023-05-20 LAB — RENAL FUNCTION PANEL
Albumin: 2.3 g/dL — ABNORMAL LOW (ref 3.5–5.0)
Anion gap: 11 (ref 5–15)
BUN: 6 mg/dL (ref 6–20)
CO2: 27 mmol/L (ref 22–32)
Calcium: 8.9 mg/dL (ref 8.9–10.3)
Chloride: 98 mmol/L (ref 98–111)
Creatinine, Ser: 0.72 mg/dL (ref 0.44–1.00)
GFR, Estimated: >60 mL/min (ref >60)
Glucose, Bld: 139 mg/dL — ABNORMAL HIGH (ref 70–99)
Phosphorus: 3.1 mg/dL (ref 2.5–4.6)
Potassium: 3.1 mmol/L — ABNORMAL LOW (ref 3.5–5.1)
Sodium: 136 mmol/L (ref 135–145)

## 2023-05-20 LAB — MAGNESIUM: Magnesium: 2 mg/dL (ref 1.7–2.4)

## 2023-05-20 MED ORDER — POTASSIUM CHLORIDE 10 MEQ/100ML IV SOLN
10.0000 meq | INTRAVENOUS | Status: DC
  Administered 2023-05-20: 10 meq via INTRAVENOUS
  Filled 2023-05-20 (×2): qty 100

## 2023-05-20 MED ORDER — GUAIFENESIN-DM 100-10 MG/5ML PO SYRP
5.0000 mL | ORAL_SOLUTION | ORAL | Status: DC | PRN
  Administered 2023-05-20 – 2023-05-21 (×4): 5 mL via ORAL
  Filled 2023-05-20 (×3): qty 10
  Filled 2023-05-20: qty 5
  Filled 2023-05-20: qty 10

## 2023-05-20 MED ORDER — POTASSIUM CHLORIDE 20 MEQ PO PACK
40.0000 meq | PACK | ORAL | Status: AC
Start: 2023-05-20 — End: 2023-05-20
  Administered 2023-05-20 (×2): 40 meq via ORAL
  Filled 2023-05-20 (×2): qty 2

## 2023-05-20 MED ORDER — POTASSIUM CHLORIDE IN NACL 20-0.9 MEQ/L-% IV SOLN
INTRAVENOUS | Status: DC
  Filled 2023-05-20: qty 1000

## 2023-05-20 NOTE — Plan of Care (Signed)
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
## 2023-05-21 DIAGNOSIS — J189 Pneumonia, unspecified organism: Secondary | ICD-10-CM | POA: Diagnosis not present

## 2023-05-21 LAB — MAGNESIUM: Magnesium: 1.9 mg/dL (ref 1.7–2.4)

## 2023-05-21 LAB — RENAL FUNCTION PANEL
Albumin: 1.9 g/dL — ABNORMAL LOW (ref 3.5–5.0)
Anion gap: 10 (ref 5–15)
BUN: <5 mg/dL — ABNORMAL LOW (ref 6–20)
CO2: 22 mmol/L (ref 22–32)
Calcium: 8.8 mg/dL — ABNORMAL LOW (ref 8.9–10.3)
Chloride: 105 mmol/L (ref 98–111)
Creatinine, Ser: 0.79 mg/dL (ref 0.44–1.00)
GFR, Estimated: >60 mL/min (ref >60)
Glucose, Bld: 117 mg/dL — ABNORMAL HIGH (ref 70–99)
Phosphorus: 2.8 mg/dL (ref 2.5–4.6)
Potassium: 3.8 mmol/L (ref 3.5–5.1)
Sodium: 137 mmol/L (ref 135–145)

## 2023-05-21 MED ORDER — LIDOCAINE 5 % EX PTCH: 1.0000 | MEDICATED_PATCH | CUTANEOUS | 0 refills | Status: DC

## 2023-05-21 MED ORDER — OXYCODONE HCL 5 MG PO TABS: 5.0000 mg | ORAL_TABLET | ORAL | 0 refills | Status: DC | PRN

## 2023-05-21 NOTE — Plan of Care (Signed)
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
## 2023-05-23 ENCOUNTER — Telehealth: Payer: Self-pay

## 2023-05-23 NOTE — Telephone Encounter (Signed)
 PA initiated via Covermymeds; KEY: [id number]. Awaiting determination.

## 2023-05-23 NOTE — Telephone Encounter (Signed)
Looks like hospitalist ordered lidocaine, she is needing a PA- okay to run it in your name?

## 2023-05-23 NOTE — Telephone Encounter (Signed)
 Patient made aware of recommendations.

## 2023-05-23 NOTE — Telephone Encounter (Signed)
 PA denied. Insurance only covers for post-herpetic neuralgia, neuropathic pain, or chronic musclo-skeletal pain (greater than 6 months in duration).   [employer] reviewed your LIDOCAINE  5% PATCH request for the above-identified member,  and it is denied for the following reason: because we did not see what we need to approve the  drug you asked for, (lidocaine  5 percent patch). This request tells us  your doctor asked for this  drug for your illness (multiple rib fractures). We may be able to approve this drug for certain  illnesses (post-herpetic neuralgia, neuropathic pain, and chronic musculo-skeletal pain [greater  than 6 months in duration]). If you do have one of these illnesses, we may need more  information. We based this decision on your health plan's prior authorization clinical criteria  named Topical Local Anesthetics.

## 2023-05-23 NOTE — Telephone Encounter (Signed)
 Copied from CRM [id number]. Topic: Clinical - Medication Question >> May 23, 2023 10:06 AM Drema MATSU wrote: Reason for CRM: Patient states that she needs a Pre Authorization for  lidocaine  (LIDODERM ) 5 % so her pharmacy can fill it. She said that she got this prescription from the hospital.

## 2023-05-24 ENCOUNTER — Encounter: Payer: Self-pay | Admitting: Family Medicine

## 2023-05-24 ENCOUNTER — Ambulatory Visit: Payer: Medicaid Other | Admitting: Family Medicine

## 2023-05-24 VITALS — BP 115/75 | HR 104 | Temp 98.6°F | Ht 68.0 in | Wt 170.0 lb

## 2023-05-24 DIAGNOSIS — E871 Hypo-osmolality and hyponatremia: Secondary | ICD-10-CM | POA: Diagnosis not present

## 2023-05-24 DIAGNOSIS — E559 Vitamin D deficiency, unspecified: Secondary | ICD-10-CM

## 2023-05-24 DIAGNOSIS — J189 Pneumonia, unspecified organism: Secondary | ICD-10-CM | POA: Diagnosis not present

## 2023-05-24 NOTE — Progress Notes (Signed)
 Acute Office Visit  Subjective:     Patient ID: Patricia Mcmahon, female    DOB: 10/16/1985, 38 y.o.   MRN: [id number]  Chief Complaint  Patient presents with   Weakness    HPI Patient is in today for hospital follow-up.   Discussed the use of AI scribe software for clinical note transcription with the patient, who gave verbal consent to proceed.  History of Present Illness   Patricia Mcmahon is a 38 year old female who presents with ongoing symptoms following hospitalization for pneumonia and rib fractures.   She was recently hospitalized for pneumonia following a flu infection, during which she met sepsis criteria. She was admitted for approximately four days, from Thursday to Monday. During her hospital stay, a CT scan ruled out blood clots despite an elevated D-dimer, and a chest x-ray confirmed pneumonia. She was treated with Tamiflu  for the flu, azithromycin , and IV Rocephin  for pneumonia, along with IV fluids and potassium supplementation due to low levels. She was discharged with instructions to use a spirometer and monitor her symptoms.  She experiences ongoing shortness of breath and significant pain from rib fractures sustained in a fall at work a couple of weeks ago. Initially unaware of the severity, the fractures include nondisplaced fractures of the right eighth, ninth, and tenth ribs, and healing fractures of the fourth, sixth, eighth, and ninth ribs. She is currently taking oxycodone  5 mcg for pain, which is effective but causes drowsiness. She is resting at home and avoiding driving due to medication-induced drowsiness.  She continues to experience a persistent cough, for which she is taking Robitussin DM, noting some gradual improvement. No new fevers or significant changes in her symptoms since discharge.              ROS All review of systems negative except what is listed in the HPI      Objective:    BP 115/75   Pulse (!) 104   Temp 98.6 F (37 C)  (Oral)   Ht 5' 8 (1.727 m)   Wt 170 lb (77.1 kg)   SpO2 96%   BMI 25.85 kg/m    Physical Exam Vitals reviewed.  Constitutional:      General: She is not in acute distress.    Appearance: Normal appearance.  Cardiovascular:     Rate and Rhythm: Normal rate and regular rhythm.     Pulses: Normal pulses.  Pulmonary:     Breath sounds: No wheezing or rhonchi.     Comments: Right side mildly diminished Musculoskeletal:     Cervical back: Normal range of motion.  Skin:    General: Skin is warm and dry.  Neurological:     Mental Status: She is oriented to person, place, and time.  Psychiatric:        Mood and Affect: Mood normal.        Behavior: Behavior normal.        Thought Content: Thought content normal.     No results found for any visits on 05/24/23.      Assessment & Plan:   Problem List Items Addressed This Visit       Active Problems   Vitamin D  deficiency   Relevant Orders   Vitamin D  (25 hydroxy)   Pneumonia - Primary   Relevant Orders   CBC with Differential/Platelet   Basic metabolic panel   Other Visit Diagnoses       Hypocalcemia  Relevant Orders   Phosphorus     Hyponatremia       Relevant Orders   Basic metabolic panel     Hypophosphatemia       Relevant Orders   Phosphorus         Pneumonia Recent hospitalization for pneumonia secondary to influenza. Treated with Tamiflu , azithromycin , and IV Rocephin . Persistent cough and shortness of breath, minimal improvement since discharge 3 days ago. -Continue home breathing treatments. -Check labs today to assess for ongoing infection and electrolyte imbalance -Consider additional antibiotic if white blood cell count is elevated. -Repeat chest x-ray in 3-4 weeks to ensure resolution.  Rib Fractures Multiple non-displaced rib fractures from recent fall causing significant pain. Currently on oxycodone  5mcg for pain management, but causing drowsiness. -Advise to take Tylenol  every 6  hours for pain control. -Continue using Salonpas patches for local pain relief. -Return to work in one week, pending patient's comfort and pain control.   Follow-up in 3-4 weeks for reassessment and repeat chest x-ray.         No orders of the defined types were placed in this encounter.   Return in about 3 weeks (around 06/14/2023) for pneumonia follow-up .  Waddell KATHEE Mon, NP

## 2023-05-25 ENCOUNTER — Encounter: Payer: Self-pay | Admitting: Family Medicine

## 2023-05-25 ENCOUNTER — Other Ambulatory Visit (HOSPITAL_BASED_OUTPATIENT_CLINIC_OR_DEPARTMENT_OTHER): Payer: Self-pay

## 2023-05-25 ENCOUNTER — Telehealth: Payer: Self-pay

## 2023-05-25 LAB — CBC WITH DIFFERENTIAL/PLATELET
Basophils Absolute: 0.1 10*3/uL (ref 0.0–0.1)
Basophils Relative: 0.4 % (ref 0.0–3.0)
Eosinophils Absolute: 0.2 10*3/uL (ref 0.0–0.7)
Eosinophils Relative: 1 % (ref 0.0–5.0)
HCT: 35.5 % — ABNORMAL LOW (ref 36.0–46.0)
Hemoglobin: 11.8 g/dL — ABNORMAL LOW (ref 12.0–15.0)
Lymphocytes Relative: 3.1 % — ABNORMAL LOW (ref 12.0–46.0)
Lymphs Abs: 0.5 10*3/uL — ABNORMAL LOW (ref 0.7–4.0)
MCHC: 33.2 g/dL (ref 30.0–36.0)
MCV: 95.3 fL (ref 78.0–100.0)
Monocytes Absolute: 0 10*3/uL — ABNORMAL LOW (ref 0.1–1.0)
Monocytes Relative: 0.2 % — ABNORMAL LOW (ref 3.0–12.0)
Neutro Abs: 14.2 10*3/uL — ABNORMAL HIGH (ref 1.4–7.7)
Neutrophils Relative %: 95.3 % — ABNORMAL HIGH (ref 43.0–77.0)
Platelets: 348 10*3/uL (ref 150.0–400.0)
RBC: 3.73 Mil/uL — ABNORMAL LOW (ref 3.87–5.11)
RDW: 13.1 % (ref 11.5–15.5)
WBC: 14.9 10*3/uL — ABNORMAL HIGH (ref 4.0–10.5)

## 2023-05-25 LAB — BASIC METABOLIC PANEL
BUN: 5 mg/dL — ABNORMAL LOW (ref 6–23)
CO2: 27 meq/L (ref 19–32)
Calcium: 8.2 mg/dL — ABNORMAL LOW (ref 8.4–10.5)
Chloride: 99 meq/L (ref 96–112)
Creatinine, Ser: 0.63 mg/dL (ref 0.40–1.20)
GFR: 111.56 mL/min (ref >60.00)
Glucose, Bld: 98 mg/dL (ref 70–99)
Potassium: 3.5 meq/L (ref 3.5–5.1)
Sodium: 139 meq/L (ref 135–145)

## 2023-05-25 LAB — PHOSPHORUS: Phosphorus: 3.7 mg/dL (ref 2.3–4.6)

## 2023-05-25 LAB — VITAMIN D 25 HYDROXY (VIT D DEFICIENCY, FRACTURES): VITD: 28.17 ng/mL — ABNORMAL LOW (ref 30.00–100.00)

## 2023-05-25 MED ORDER — DOXYCYCLINE HYCLATE 100 MG PO CAPS
100.0000 mg | ORAL_CAPSULE | Freq: Two times a day (BID) | ORAL | 0 refills | Status: AC
  Filled 2023-05-25: qty 20, 10d supply, fill #0

## 2023-05-25 NOTE — Telephone Encounter (Signed)
 Copied from CRM [id number]. Topic: General - Call Back - No Documentation >> May 25, 2023  2:44 PM Burnard DEL wrote: Reason for CRM: patient returned call to [facility] regarding lab results.I relayed message to her fro Waddell Mon.She verbalized understanding and will go get OTC medication.I also schedule her for 2 m lab f/u appointment.

## 2023-05-25 NOTE — Telephone Encounter (Signed)
 Noted.

## 2023-05-25 NOTE — Addendum Note (Signed)
 Addended by: Minna Amass B on: 05/25/2023 02:04 PM   Modules accepted: Orders

## 2023-05-28 ENCOUNTER — Telehealth: Payer: Self-pay | Admitting: Family Medicine

## 2023-05-28 DIAGNOSIS — J189 Pneumonia, unspecified organism: Secondary | ICD-10-CM

## 2023-05-28 MED ORDER — PROMETHAZINE-DM 6.25-15 MG/5ML PO SYRP: 5.0000 mL | ORAL_SOLUTION | Freq: Four times a day (QID) | ORAL | 0 refills | Status: DC | PRN

## 2023-05-28 NOTE — Telephone Encounter (Signed)
 Patient made aware of message from [place].

## 2023-05-28 NOTE — Telephone Encounter (Signed)
 Was given Doxycycline  3 days ago for ongoing Pneumonia. Do you want to send something to help with cough?

## 2023-05-28 NOTE — Telephone Encounter (Signed)
 Copied from CRM [id number]. Topic: Clinical - Medication Question >> May 28, 2023  8:45 AM Jayson Michael wrote: Reason for CRM: patient wanting to speak with a nurse pertaining to cough medication: released from hospital last Monday, she has taken 2 bottles of robitussin, and has not helped, and she wants to know what other alternatives she can try. please call patient back at [phone]

## 2023-05-29 ENCOUNTER — Telehealth (HOSPITAL_COMMUNITY): Payer: Medicaid Other | Admitting: Physician Assistant

## 2023-05-29 DIAGNOSIS — F411 Generalized anxiety disorder: Secondary | ICD-10-CM | POA: Diagnosis not present

## 2023-05-29 DIAGNOSIS — F3162 Bipolar disorder, current episode mixed, moderate: Secondary | ICD-10-CM | POA: Diagnosis not present

## 2023-05-31 ENCOUNTER — Encounter: Payer: Self-pay | Admitting: Family Medicine

## 2023-05-31 DIAGNOSIS — J189 Pneumonia, unspecified organism: Secondary | ICD-10-CM

## 2023-05-31 DIAGNOSIS — S2241XA Multiple fractures of ribs, right side, initial encounter for closed fracture: Secondary | ICD-10-CM

## 2023-05-31 MED ORDER — OXYCODONE-ACETAMINOPHEN 5-325 MG PO TABS: 1.0000 | ORAL_TABLET | Freq: Four times a day (QID) | ORAL | 0 refills | Status: DC | PRN

## 2023-05-31 NOTE — Addendum Note (Signed)
Addended by: Hyman Hopes B on: 05/31/2023 01:17 PM   Modules accepted: Orders

## 2023-05-31 NOTE — Addendum Note (Signed)
Addended by: Hyman Hopes B on: 05/31/2023 01:00 PM   Modules accepted: Orders

## 2023-06-01 ENCOUNTER — Encounter (HOSPITAL_COMMUNITY): Payer: Self-pay | Admitting: Physician Assistant

## 2023-06-01 MED ORDER — BUSPIRONE HCL 15 MG PO TABS: 15.0000 mg | ORAL_TABLET | Freq: Two times a day (BID) | ORAL | 1 refills | Status: DC

## 2023-06-01 MED ORDER — OLANZAPINE 5 MG PO TABS: 5.0000 mg | ORAL_TABLET | Freq: Every day | ORAL | 1 refills | Status: DC

## 2023-06-01 MED ORDER — LAMOTRIGINE 150 MG PO TABS: 150.0000 mg | ORAL_TABLET | Freq: Every day | ORAL | 1 refills | Status: DC

## 2023-06-01 NOTE — Progress Notes (Signed)
BH MD/PA/NP OP Progress Note  Virtual Visit via Video Note  I connected with Patricia Mcmahon on 05/29/23 at  4:30 PM EST by a video enabled telemedicine application and verified that I am speaking with the correct person using two identifiers.  Location: Patient: Home Provider: Clinic   I discussed the limitations of evaluation and management by telemedicine and the availability of in person appointments. The patient expressed understanding and agreed to proceed.  Follow Up Instructions:   I discussed the assessment and treatment plan with the patient. The patient was provided an opportunity to ask questions and all were answered. The patient agreed with the plan and demonstrated an understanding of the instructions.   The patient was advised to call back or seek an in-person evaluation if the symptoms worsen or if the condition fails to improve as anticipated.  I provided 10 minutes of non-face-to-face time during this encounter.  Meta Hatchet, PA    05/29/2023 4:30 PM [name]  MRN:  [id number]  Chief Complaint:  No chief complaint on file.  HPI:   Patricia Mcmahon. Mottram is a 38 year old female with a past psychiatric history significant for bipolar 1 disorder (mixed, moderate) and generalized anxiety disorder who presents to [facility] via virtual video visit for follow-up and medication management.  Patient is currently being managed on the following psychiatric medications:  Lamictal 150 mg at bedtime Buspirone 10 mg 2 times daily Olanzapine 5 mg at bedtime  Patient reports that she is taking her medications regularly.  Patient denies experiencing any adverse side effects.  Patient states that she experiences depressive symptoms "here and there" but not regularly.  She reports that since taking her olanzapine, her depressive symptoms have been more manageable.  Patient denies excess sleep or difficulty getting out of bed.  She does  continue to endorse anxiety and states that she has been more panicky.  Despite her anxiety, patient reports that her medications have been helpful in settling her nerves before experiencing a full-blown panic attack.  A PHQ-9 screen was performed with the patient scoring an 8.  A GAD-7 screen was also performed with the patient scoring a 12.  Patient is alert and oriented x 4, calm, cooperative, and fully engaged in conversation during the encounter.  Patient endorses good mood.  Patient exhibits euthymic mood with appropriate affect.  Patient denies suicidal or homicidal ideations.  She further denies auditory or visual hallucinations and does not appear to be responding to internal/external stimuli.  Patient endorses fair sleep and receives on average 8 to 10 hours of sleep per night.  Patient states that she only sleeps in 1 to 2-hour increments.  Patient endorses good appetite and eats on average 3 meals per day.  Patient endorses alcohol consumption socially.  Patient endorses tobacco use and smokes on average 1/2 pack/day.  Patient denies illicit drug use.  Visit Diagnosis:    ICD-10-CM   1. GAD (generalized anxiety disorder)  F41.1 busPIRone (BUSPAR) 15 MG tablet    2. 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  Head trauma: Denied Seizures: Denied DT: Denied Allergies: Morphine and codeine   Past Medical History:  Past  Medical History:  Diagnosis Date   GERD (gastroesophageal reflux disease)    no current med.   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   Left knee pain 12/16/2012   Thrombocytopenia (HCC) 08/16/2011    Past Surgical History:   Procedure Laterality Date   CESAREAN SECTION     ESOPHAGOGASTRODUODENOSCOPY N/A 01/14/2021   Procedure: ESOPHAGOGASTRODUODENOSCOPY (EGD);  Surgeon: Willis Modena, MD;  Location: [facility];  Service: Endoscopy;  Laterality: N/A;   GASTRECTOMY     sleeve   KNEE ARTHROSCOPY  04/02/2012   Procedure: ARTHROSCOPY KNEE;  Surgeon: Velna Ochs, MD;  Location: [facility];  Service: Orthopedics;  Laterality: Left;  left knee arthroscopy chondroplasty   KNEE ARTHROSCOPY WITH LATERAL RELEASE Left 01/07/2013   Procedure: LEFT KNEE ARTHROSCOPY, CHONDROPLASTY AND  LATERAL RELEASE;  Surgeon: Velna Ochs, MD;  Location: [facility];  Service: Orthopedics;  Laterality: Left;    Family Psychiatric History:  Suicide: Paternal aunt completed suicide Homicide: Unsure Psych hospitalization: Denied BiPD: ?Dad SCZ/SCzA: ?Dad Others: Dad EtOH, learning disabilities  Family History:  Family History  Problem Relation Age of Onset   Alcohol abuse Mother    Cancer Mother    Learning disabilities Father    Alcohol abuse Father    Heart disease Maternal Grandmother    Diabetes Maternal Grandfather     Social History:  Social History   Socioeconomic History   Marital status: Single    Spouse name: Not on file   Number of children: Not on file   Years of education: Not on file   Highest education level: Not on file  Occupational History   Not on file  Tobacco Use   Smoking status: Every Day    Current packs/day: 1.00    Average packs/day: 1 pack/day for 16.0 years (16.0 ttl pk-yrs)    Types: Cigarettes   Smokeless tobacco: Never  Vaping Use   Vaping status: Never Used  Substance and Sexual Activity   Alcohol use: Yes    Comment: socially   Drug use: No   Sexual activity: Not on file  Other Topics Concern   Not on file  Social History Narrative   Not on file   Social Drivers of Health   Financial Resource Strain: Not on file  Food Insecurity:  Food Insecurity Present (05/17/2023)   Hunger Vital Sign    Worried About Running Out of Food in the Last Year: Sometimes true    Ran Out of Food in the Last Year: Never true  Transportation Needs: No Transportation Needs (05/17/2023)   PRAPARE - Administrator, Civil Service (Medical): No    Lack of Transportation (Non-Medical): No  Physical Activity: Not on file  Stress: Not on file  Social Connections: Unknown (08/29/2021)   Received from [facility]   Social Network    Social Network: Not on file    Allergies:  Allergies  Allergen Reactions   Morphine Other (See Comments)    Behavior changes - becomes hostile/violent    Metabolic Disorder Labs: No results found for: "HGBA1C", "MPG" No results found for: "PROLACTIN" Lab Results  Component Value Date   CHOL 131 04/19/2022   TRIG 71.0 04/19/2022   HDL 72.00 04/19/2022   CHOLHDL 2 04/19/2022   VLDL 14.2 04/19/2022   LDLCALC 45 04/19/2022   Lab Results  Component Value Date   TSH 1.29 11/30/2022    Therapeutic Level Labs: No results found for: "LITHIUM" No  results found for: "VALPROATE" No results found for: "CBMZ"  Current Medications: Current Outpatient Medications  Medication Sig Dispense Refill   OLANZapine (ZYPREXA) 5 MG tablet Take 1 tablet (5 mg total) by mouth at bedtime. 30 tablet 1   Acetaminophen (TYLENOL PO) Take 1,000 mg by mouth daily as needed. Gel Capsules     busPIRone (BUSPAR) 15 MG tablet Take 1 tablet (15 mg total) by mouth 2 (two) times daily. 60 tablet 1   Cholecalciferol (VITAMIN D3) 25 MCG (1000 UT) CAPS Take 1 capsule (1,000 Units total) by mouth daily. 90 capsule 3   cyanocobalamin (VITAMIN B12) 1000 MCG tablet Take 1 tablet (1,000 mcg total) by mouth daily. 90 tablet 1   doxycycline (VIBRAMYCIN) 100 MG capsule Take 1 capsule (100 mg total) by mouth 2 (two) times daily for 10 days. 20 capsule 0   escitalopram (LEXAPRO) 10 MG tablet Take 1 tablet (10 mg total) by  mouth daily. 30 tablet 1   Ibuprofen (ADVIL PO) Take 2 capsules by mouth daily as needed. Gel Capsules     lamoTRIgine (LAMICTAL) 150 MG tablet Take 1 tablet (150 mg total) by mouth at bedtime. 30 tablet 1   lidocaine (LIDODERM) 5 % Place 1 patch onto the skin daily. Remove & Discard patch within 12 hours or as directed by MD 30 patch 0   Multiple Vitamins-Minerals (MULTIVITAMIN WITH MINERALS) tablet Take 1 tablet by mouth daily. 90 tablet 1   oxyCODONE-acetaminophen (PERCOCET) 5-325 MG tablet Take 1 tablet by mouth every 6 (six) hours as needed for severe pain (pain score 7-10). Use sparingly to avoid tolerance/dependence 15 tablet 0   promethazine-dextromethorphan (PROMETHAZINE-DM) 6.25-15 MG/5ML syrup Take 5 mLs by mouth 4 (four) times daily as needed for cough. 118 mL 0   No current facility-administered medications for this visit.     Musculoskeletal: Strength & Muscle Tone: within normal limits Gait & Station: normal Patient leans: N/A  Psychiatric Specialty Exam: Review of Systems  Psychiatric/Behavioral:  Positive for dysphoric mood and sleep disturbance. Negative for decreased concentration, hallucinations, self-injury and suicidal ideas. The patient is nervous/anxious. The patient is not hyperactive.     There were no vitals taken for this visit.There is no height or weight on file to calculate BMI.  General Appearance: Casual  Eye Contact:  Good  Speech:  Clear and Coherent and Normal Rate  Volume:  Normal  Mood:  Anxious and Depressed  Affect:  Appropriate  Thought Process:  Coherent, Goal Directed, and Descriptions of Associations: Intact  Orientation:  Full (Time, Place, and Person)  Thought Content: WDL   Suicidal Thoughts:  No  Homicidal Thoughts:  No  Memory:  Immediate;   Good Recent;   Good Remote;   Good  Judgement:  Good  Insight:  Good  Psychomotor Activity:  Normal  Concentration:  Concentration: Good and Attention Span: Good  Recall:  Good  Fund of  Knowledge: Good  Language: Good  Akathisia:  No  Handed:  Unknown  AIMS (if indicated): not done  Assets:  Communication Skills Desire for Improvement Financial Resources/Insurance Housing Intimacy Leisure Time Physical Health Resilience Social Support Talents/Skills Transportation Vocational/Educational  ADL's:  Intact  Cognition: WNL  Sleep:  Fair   Screenings: GAD-7    Flowsheet Row Video Visit from 05/29/2023 in [facility] Video Visit from 04/24/2023 in [facility] Video Visit from 03/07/2023 in [facility] Clinical Support from 01/24/2023 in [facility] Office Visit from 06/26/2022 in [facility]  Total GAD-7 Score 12 18 13 11 14       PHQ2-9    Flowsheet Row Video Visit from 05/29/2023 in [facility] Video Visit from 04/24/2023 in [facility] Video Visit from 03/07/2023 in [facility] Clinical Support from 01/24/2023 in [facility] Office Visit from 06/26/2022 in [facility] Primary Care at [facility]  PHQ-2 Total Score 2 6 5 2 4   PHQ-9 Total Score 8 21 16 7 18       Flowsheet Row Video Visit from 05/29/2023 in [facility] ED to Hosp-Admission (Discharged) from 05/17/2023 in [facility] Video Visit from 04/24/2023 in [facility]  C-SSRS RISK CATEGORY No Risk No Risk No Risk        Assessment and Plan:   Paisley Grajeda. Dunlop is a 38 year old female with a past psychiatric history significant for bipolar 1 disorder (mixed, moderate) and generalized anxiety disorder who presents to [facility] via virtual video visit for follow-up and medication management.  Patient presents today  encounter stating that the use of her olanzapine has been effective in managing her depressive symptoms.  She reports that she still continues to experience anxiety but states that her medications have been helpful in preventing full-blown panic attacks.  Patient endorses stability on her current medication regimen and would like to continue taking her medications as prescribed.  [name] medications to be e-prescribed to pharmacy of choice.  Collaboration of Care: Collaboration of Care: Medication Management AEB provider managing patient's psychiatric medications, Primary Care Provider AEB patient being seen by a family medicine provider, and Psychiatrist AEB patient being followed by mental health provider at this facility  Patient/Guardian was advised Release of Information must be obtained prior to any record release in order to collaborate their care with an outside provider. Patient/Guardian was advised if they have not already done so to contact the registration department to sign all necessary forms in order for [place] to release information regarding their care.   Consent: Patient/Guardian gives verbal consent for treatment and assignment of benefits for services provided during this visit. Patient/Guardian expressed understanding and agreed to proceed.   1. GAD (generalized anxiety disorder)  - busPIRone (BUSPAR) 15 MG tablet; Take 1 tablet (15 mg total) by mouth 2 (two) times daily.  Dispense: 60 tablet; Refill: 1  2. Bipolar 1 disorder, mixed, moderate (HCC)  - lamoTRIgine (LAMICTAL) 150 MG tablet; Take 1 tablet (150 mg total) by mouth at bedtime.  Dispense: 30 tablet; Refill: 1 - OLANZapine (ZYPREXA) 5 MG tablet; Take 1 tablet (5 mg total) by mouth at bedtime.  Dispense: 30 tablet; Refill: 1  Patient to follow up in 6 weeks Provider spent a total of 10 minutes with the patient/reviewing patient's chart  Meta Hatchet, PA 05/29/2023, 4:30 PM

## 2023-06-04 MED ORDER — BENZONATATE 200 MG PO CAPS: 200.0000 mg | ORAL_CAPSULE | Freq: Three times a day (TID) | ORAL | 0 refills | Status: DC | PRN

## 2023-06-04 MED ORDER — HYDROCOD POLI-CHLORPHE POLI ER 10-8 MG/5ML PO SUER: 5.0000 mL | Freq: Two times a day (BID) | ORAL | 0 refills | Status: AC | PRN

## 2023-06-04 NOTE — Addendum Note (Signed)
Addended by: Hyman Hopes B on: 06/04/2023 08:22 AM   Modules accepted: Orders

## 2023-06-11 MED ORDER — ALBUTEROL SULFATE HFA 108 (90 BASE) MCG/ACT IN AERS: 2.0000 | INHALATION_SPRAY | Freq: Four times a day (QID) | RESPIRATORY_TRACT | 0 refills | Status: DC | PRN

## 2023-06-11 NOTE — Addendum Note (Signed)
 Addended by: Clayborne Dana on: 06/11/2023 03:13 PM   Modules accepted: Orders

## 2023-06-14 ENCOUNTER — Ambulatory Visit (HOSPITAL_BASED_OUTPATIENT_CLINIC_OR_DEPARTMENT_OTHER)
Admission: RE | Admit: 2023-06-14 | Discharge: 2023-06-14 | Disposition: A | Discharge status: Home / Self Care | Payer: Medicaid Other | Source: Ambulatory Visit | Attending: Family Medicine | Admitting: Family Medicine

## 2023-06-14 ENCOUNTER — Other Ambulatory Visit (HOSPITAL_BASED_OUTPATIENT_CLINIC_OR_DEPARTMENT_OTHER): Payer: Self-pay

## 2023-06-14 ENCOUNTER — Other Ambulatory Visit: Payer: Self-pay | Admitting: Family Medicine

## 2023-06-14 ENCOUNTER — Ambulatory Visit: Payer: Medicaid Other | Admitting: Family Medicine

## 2023-06-14 ENCOUNTER — Encounter: Payer: Self-pay | Admitting: Family Medicine

## 2023-06-14 VITALS — BP 119/79 | HR 96 | Ht 68.0 in | Wt 166.0 lb

## 2023-06-14 DIAGNOSIS — J90 Pleural effusion, not elsewhere classified: Secondary | ICD-10-CM | POA: Diagnosis not present

## 2023-06-14 DIAGNOSIS — J189 Pneumonia, unspecified organism: Secondary | ICD-10-CM | POA: Diagnosis not present

## 2023-06-14 DIAGNOSIS — S2241XA Multiple fractures of ribs, right side, initial encounter for closed fracture: Secondary | ICD-10-CM | POA: Diagnosis not present

## 2023-06-14 DIAGNOSIS — R918 Other nonspecific abnormal finding of lung field: Secondary | ICD-10-CM | POA: Diagnosis not present

## 2023-06-14 MED ORDER — OXYCODONE-ACETAMINOPHEN 5-325 MG PO TABS: 1.0000 | ORAL_TABLET | Freq: Four times a day (QID) | ORAL | 0 refills | Status: DC | PRN

## 2023-06-14 MED ORDER — OXYCODONE-ACETAMINOPHEN 5-325 MG PO TABS
1.0000 | ORAL_TABLET | Freq: Four times a day (QID) | ORAL | 0 refills | Status: DC | PRN
  Filled 2023-06-14: qty 15, 4d supply, fill #0

## 2023-06-14 NOTE — Progress Notes (Signed)
 Acute Office Visit  Subjective:     Patient ID: Patricia Mcmahon, female    DOB: [birth year], 38 y.o.   MRN: [id number]  Chief Complaint  Patient presents with   Medical Management of Chronic Issues    HPI Patient is in today for pneumonia follow-up.    Discussed the use of AI scribe software for clinical note transcription with the patient, who gave verbal consent to proceed.  History of Present Illness Patricia Mcmahon is a 38 year old female with rib fractures and pneumonia who presents for 3-week follow-up  She experiences persistent shortness of breath and a raspy sensation in upper chest, particularly during episodes of dyspnea. Her cough is improving, and she finds the incentive spirometer helpful, especially during episodes of wheezing or difficulty breathing. She uses an inhaler as needed, which provides relief. No recent fevers have been noted, and she describes a gradual improvement in her symptoms each day.  She has persistent pain in the right side associated with rib fractures, described as less severe than before but still bothersome. She has been alternating Tylenol and Advil for pain management and has tried over-the-counter lidocaine patches, which have not been effective. Previously, she found relief with oxycodone-acetaminophen.  She has restarted her vitamin D, B12, and multivitamin supplements after a period of not taking them while hospitalized.       ROS All review of systems negative except what is listed in the HPI      Objective:    BP 119/79   Pulse 96   Ht 5\' 8"  (1.727 m)   Wt 166 lb (75.3 kg)   SpO2 99%   BMI 25.24 kg/m    Physical Exam Vitals reviewed.  Constitutional:      General: She is not in acute distress.    Appearance: Normal appearance.  Cardiovascular:     Rate and Rhythm: Normal rate and regular rhythm.     Pulses: Normal pulses.  Pulmonary:     Breath sounds: No wheezing or rhonchi.     Comments: Right side mildly  diminished Musculoskeletal:     Cervical back: Normal range of motion.  Skin:    General: Skin is warm and dry.  Neurological:     Mental Status: She is oriented to person, place, and time.  Psychiatric:        Mood and Affect: Mood normal.        Behavior: Behavior normal.        Thought Content: Thought content normal.     No results found for any visits on 06/14/23.      Assessment & Plan:   Problem List Items Addressed This Visit       Active Problems   Fracture of rib of right side   Relevant Medications   oxyCODONE-acetaminophen (PERCOCET) 5-325 MG tablet   Pneumonia - Primary   Relevant Orders   DG Chest 2 View   Assessment & Plan Pneumonia Improvement in cough, but persistent shortness of breath. No recent fevers. -Order chest x-ray today to assess resolution of pneumonia.  Rib Fractures Persistent pain, not significantly relieved by over-the-counter lidocaine patches, Tylenol, Advil, and Tandemist. -Prescribe a small quantity of Percocet (oxycodone/acetaminophen), instruct patient to try half a tablet to one tablet only as needed. -Continue use of incentive spirometer  General Health Maintenance -Plan to check complete blood count, vitamin levels, and other routine labs at upcoming physical on July 02, 2023.    Meds ordered this encounter  Medications   oxyCODONE-acetaminophen (PERCOCET) 5-325 MG tablet    Sig: Take 1 tablet by mouth every 6 (six) hours as needed for severe pain (pain score 7-10). Use sparingly to avoid tolerance/dependence    Dispense:  15 tablet    Refill:  0    Supervising Provider:   Danise Edge A [[id number]]    Return if symptoms worsen or fail to improve.  Clayborne Dana, NP

## 2023-06-14 NOTE — Progress Notes (Signed)
 Percocet sent to wrong pharmacy. Resending to [facility].  Previous [facility]istic[facility] Rx cancelled - confirmed by Manson, CMA.

## 2023-06-18 ENCOUNTER — Encounter: Payer: Self-pay | Admitting: Family Medicine

## 2023-06-18 MED ORDER — AMOXICILLIN-POT CLAVULANATE 875-125 MG PO TABS: 1.0000 | ORAL_TABLET | Freq: Two times a day (BID) | ORAL | 0 refills | Status: AC

## 2023-06-18 NOTE — Addendum Note (Signed)
 Addended by: Clayborne Dana on: 06/18/2023 02:33 PM   Modules accepted: Orders

## 2023-07-02 ENCOUNTER — Encounter: Payer: Medicaid Other | Admitting: Family Medicine

## 2023-07-02 DIAGNOSIS — Z0000 Encounter for general adult medical examination without abnormal findings: Secondary | ICD-10-CM

## 2023-07-03 DIAGNOSIS — Z124 Encounter for screening for malignant neoplasm of cervix: Secondary | ICD-10-CM | POA: Diagnosis not present

## 2023-07-03 DIAGNOSIS — R87610 Atypical squamous cells of undetermined significance on cytologic smear of cervix (ASC-US): Secondary | ICD-10-CM | POA: Diagnosis not present

## 2023-07-03 DIAGNOSIS — Z0000 Encounter for general adult medical examination without abnormal findings: Secondary | ICD-10-CM | POA: Diagnosis not present

## 2023-07-05 ENCOUNTER — Encounter: Admitting: Family Medicine

## 2023-07-05 DIAGNOSIS — Z0000 Encounter for general adult medical examination without abnormal findings: Secondary | ICD-10-CM

## 2023-07-12 ENCOUNTER — Encounter: Admitting: Family Medicine

## 2023-07-12 DIAGNOSIS — Z0000 Encounter for general adult medical examination without abnormal findings: Secondary | ICD-10-CM

## 2023-07-16 ENCOUNTER — Ambulatory Visit (INDEPENDENT_AMBULATORY_CARE_PROVIDER_SITE_OTHER): Admitting: Family Medicine

## 2023-07-16 ENCOUNTER — Encounter: Payer: Self-pay | Admitting: Family Medicine

## 2023-07-16 VITALS — BP 117/69 | HR 80 | Ht 68.0 in | Wt 166.0 lb

## 2023-07-16 DIAGNOSIS — Z0000 Encounter for general adult medical examination without abnormal findings: Secondary | ICD-10-CM

## 2023-07-16 DIAGNOSIS — E559 Vitamin D deficiency, unspecified: Secondary | ICD-10-CM

## 2023-07-16 NOTE — Progress Notes (Signed)
 Complete physical exam  Patient: Patricia Mcmahon   DOB: 1986/04/04   38 y.o. Female  MRN: [id number]  Subjective:    Chief Complaint  Patient presents with   Annual Exam    Patricia Mcmahon is a 38 y.o. female who presents today for a complete physical exam. She reports consuming a general diet. The patient does not participate in regular exercise at present. She generally feels well. She reports sleeping fairly well. She does not have additional problems to discuss today.   Currently lives with: boyfriend and son Acute concerns or interim problems since last visit: no  Vision concerns: no Dental concerns: no STD concerns: no - recent abnormal pap with GYN following up with them next month  ETOH use: social Nicotine use: 1/2 pack/day Recreational drugs/illegal substances: no    Females:  She is currently  sexually active  Contraception choices are: Mirena LMP: n/a Mirena      Most recent fall risk assessment:    07/16/2023    3:15 PM  Fall Risk   Falls in the past year? 0  Number falls in past yr: 0  Injury with Fall? 0  Risk for fall due to : No Fall Risks  Follow up Falls evaluation completed     Most recent depression screenings:    07/16/2023    3:16 PM 05/29/2023    4:40 PM  PHQ 2/9 Scores  PHQ - 2 Score 2   PHQ- 9 Score 10      Information is confidential and restricted. Go to Review Flowsheets to unlock data.       07/16/2023    3:16 PM 05/29/2023    4:42 PM 04/24/2023    4:46 PM 03/07/2023    4:48 PM  GAD 7 : Generalized Anxiety Score  Nervous, Anxious, on Edge 2     Control/stop worrying 2     Worry too much - different things 2     Trouble relaxing 2     Restless 1     Easily annoyed or irritable 1     Afraid - awful might happen 0     Total GAD 7 Score 10     Anxiety Difficulty Somewhat difficult        Information is confidential and restricted. Go to Review Flowsheets to unlock data.            Patient Care Team: Clayborne Dana, NP as PCP - General (Family Medicine) Leola Brazil, DO as PCP - Internal Medicine   Outpatient Medications Prior to Visit  Medication Sig   Azelastine HCl 137 MCG/SPRAY SOLN Place into both nostrils.   busPIRone (BUSPAR) 15 MG tablet Take 1 tablet (15 mg total) by mouth 2 (two) times daily.   Cholecalciferol (VITAMIN D3) 25 MCG (1000 UT) CAPS Take 1 capsule (1,000 Units total) by mouth daily.   cyanocobalamin (VITAMIN B12) 1000 MCG tablet Take 1 tablet (1,000 mcg total) by mouth daily.   Ibuprofen (ADVIL PO) Take 2 capsules by mouth daily as needed. Gel Capsules   lamoTRIgine (LAMICTAL) 150 MG tablet Take 1 tablet (150 mg total) by mouth at bedtime.   Multiple Vitamins-Minerals (MULTIVITAMIN WITH MINERALS) tablet Take 1 tablet by mouth daily.   OLANZapine (ZYPREXA) 5 MG tablet Take 1 tablet (5 mg total) by mouth at bedtime.   [DISCONTINUED] Acetaminophen (TYLENOL PO) Take 1,000 mg by mouth daily as needed. Gel Capsules   [DISCONTINUED] albuterol (VENTOLIN HFA) 108 (90 Base)  MCG/ACT inhaler Inhale 2 puffs into the lungs every 6 (six) hours as needed for wheezing or shortness of breath.   [DISCONTINUED] oxyCODONE-acetaminophen (PERCOCET) 5-325 MG tablet Take 1 tablet by mouth every 6 (six) hours as needed for severe pain (pain score 7-10). Use sparingly to avoid tolerance/dependence.   No facility-administered medications prior to visit.    ROS All review of systems negative except what is listed in the HPI        Objective:     BP 117/69   Pulse 80   Ht 5\' 8"  (1.727 m)   Wt 166 lb (75.3 kg)   SpO2 99%   BMI 25.24 kg/m    Physical Exam Vitals reviewed.  Constitutional:      General: She is not in acute distress.    Appearance: Normal appearance. She is not ill-appearing.  HENT:     Head: Normocephalic and atraumatic.     Right Ear: Tympanic membrane normal.     Left Ear: Tympanic membrane normal.     Nose: Nose normal.     Mouth/Throat:     Mouth: Mucous  membranes are moist.     Pharynx: Oropharynx is clear.  Eyes:     Extraocular Movements: Extraocular movements intact.     Conjunctiva/sclera: Conjunctivae normal.     Pupils: Pupils are equal, round, and reactive to light.  Cardiovascular:     Rate and Rhythm: Normal rate and regular rhythm.     Pulses: Normal pulses.     Heart sounds: Normal heart sounds.  Pulmonary:     Effort: Pulmonary effort is normal.     Breath sounds: Normal breath sounds.  Abdominal:     General: Abdomen is flat. Bowel sounds are normal. There is no distension.     Palpations: Abdomen is soft. There is no mass.     Tenderness: There is no abdominal tenderness. There is no right CVA tenderness, left CVA tenderness, guarding or rebound.  Genitourinary:    Comments: Deferred exam Musculoskeletal:        General: Normal range of motion.     Cervical back: Normal range of motion and neck supple. No tenderness.     Right lower leg: No edema.     Left lower leg: No edema.  Lymphadenopathy:     Cervical: No cervical adenopathy.  Skin:    General: Skin is warm and dry.     Capillary Refill: Capillary refill takes less than 2 seconds.  Neurological:     General: No focal deficit present.     Mental Status: She is alert and oriented to person, place, and time. Mental status is at baseline.  Psychiatric:        Mood and Affect: Mood normal.        Behavior: Behavior normal.        Thought Content: Thought content normal.        Judgment: Judgment normal.        No results found for any visits on 07/16/23.     Assessment & Plan:    Routine Health Maintenance and Physical Exam Discussed health promotion and safety including diet and exercise recommendations, dental health, and injury prevention. Tobacco cessation if applicable. Seat belts, sunscreen, smoke detectors, etc.    Immunization History  Administered Date(s) Administered   Tdap 03/31/2018    Health Maintenance  Topic Date Due   INFLUENZA  VACCINE  07/16/2023 (Originally 11/16/2022)   Pneumococcal Vaccine 65-17 Years old (1 of 2 -  PCV) 06/13/2024 (Originally 09/24/1989)   COVID-19 Vaccine (1 - 2024-25 season) 07/03/2024 (Originally 12/17/2022)   DTaP/Tdap/Td (2 - Td or Tdap) 03/31/2028   Cervical Cancer Screening (HPV/Pap Cotest)  07/02/2028   Hepatitis C Screening  Completed   HIV Screening  Completed   HPV VACCINES  Aged Out        Problem List Items Addressed This Visit       Active Problems   Vitamin D deficiency   Relevant Orders   Comprehensive metabolic panel with GFR   VITAMIN D 25 Hydroxy (Vit-D Deficiency, Fractures)   Other Visit Diagnoses       Annual physical exam    -  Primary   Relevant Orders   Comprehensive metabolic panel with GFR   Lipid panel   TSH   VITAMIN D 25 Hydroxy (Vit-D Deficiency, Fractures)        PATIENT COUNSELING:   Advised to take 1 mg of folate supplement per day if capable of pregnancy.   Sexuality: Discussed sexually transmitted diseases, partner selection, use of condoms, avoidance of unintended pregnancy, and contraceptive alternatives.   Encouraged smoking cessation.   I discussed with the patient that most people either abstain from alcohol or drink within safe limits (<=14/week and <=4 drinks/occasion for males, <=7/weeks and <= 3 drinks/occasion for females) and that the risk for alcohol disorders and other health effects rises proportionally with the number of drinks per week and how often a drinker exceeds daily limits.  Discussed cessation/primary prevention of drug use and availability of treatment for abuse.   Diet: Encouraged to adjust caloric intake to maintain or achieve ideal body weight, to reduce intake of dietary saturated fat and total fat, to limit sodium intake by avoiding high sodium foods and not adding table salt, and to maintain adequate dietary potassium and calcium preferably from fresh fruits, vegetables, and low-fat dairy products. Encouraged  vitamin D 1000 units and Calcium 1300mg  or 4 servings of dairy a day.  Emphasized the importance of regular exercise.  Injury prevention: Discussed safety belts, safety helmets, smoke detector, smoking near bedding or upholstery.   Dental health: Discussed importance of regular tooth brushing, flossing, and dental visits.      Return in about 1 year (around 07/15/2024) for physical.     Clayborne Dana, NP

## 2023-07-17 ENCOUNTER — Encounter: Payer: Self-pay | Admitting: Family Medicine

## 2023-07-17 LAB — COMPREHENSIVE METABOLIC PANEL WITH GFR
ALT: 7 U/L (ref 0–35)
AST: 12 U/L (ref 0–37)
Albumin: 4 g/dL (ref 3.5–5.2)
Alkaline Phosphatase: 64 U/L (ref 39–117)
BUN: 11 mg/dL (ref 6–23)
CO2: 26 meq/L (ref 19–32)
Calcium: 9.3 mg/dL (ref 8.4–10.5)
Chloride: 108 meq/L (ref 96–112)
Creatinine, Ser: 0.96 mg/dL (ref 0.40–1.20)
GFR: 74.37 mL/min (ref >60.00)
Glucose, Bld: 85 mg/dL (ref 70–99)
Potassium: 4 meq/L (ref 3.5–5.1)
Sodium: 140 meq/L (ref 135–145)
Total Bilirubin: 0.4 mg/dL (ref 0.2–1.2)
Total Protein: 6.3 g/dL (ref 6.0–8.3)

## 2023-07-17 LAB — LIPID PANEL
Cholesterol: 114 mg/dL (ref 0–200)
HDL: 58.5 mg/dL (ref >39.00)
LDL Cholesterol: 38 mg/dL (ref 0–99)
NonHDL: 55.25
Total CHOL/HDL Ratio: 2
Triglycerides: 88 mg/dL (ref 0.0–149.0)
VLDL: 17.6 mg/dL (ref 0.0–40.0)

## 2023-07-17 LAB — TSH: TSH: 0.47 u[IU]/mL (ref 0.35–5.50)

## 2023-07-17 LAB — VITAMIN D 25 HYDROXY (VIT D DEFICIENCY, FRACTURES): VITD: 31.44 ng/mL (ref 30.00–100.00)

## 2023-07-24 ENCOUNTER — Other Ambulatory Visit: Payer: Medicaid Other

## 2023-08-06 DIAGNOSIS — R87810 Cervical high risk human papillomavirus (HPV) DNA test positive: Secondary | ICD-10-CM | POA: Diagnosis not present

## 2023-08-06 DIAGNOSIS — R87610 Atypical squamous cells of undetermined significance on cytologic smear of cervix (ASC-US): Secondary | ICD-10-CM | POA: Diagnosis not present

## 2023-08-21 IMAGING — CT CT ABD-PELV W/ CM
2 of 4 series · 15 of 46 positions shown, 17 images · IV contrast (Omnipaque)
Comparison: None.

CLINICAL DATA: Lt sided abd pain that started yesterday, radiates
into both hips, n/v/d. ^85mL OMNIPAQUE IOHEXOL 350 MG/ML
SOLNAbdominal pain, acute, nonlocalized

EXAM:
CT ABDOMEN AND PELVIS WITH CONTRAST
TECHNIQUE: Multidetector CT imaging of the abdomen and pelvis was performed
using the standard protocol following bolus administration of
intravenous contrast.
CONTRAST:  85mL OMNIPAQUE IOHEXOL 350 MG/ML SOLN

[Series 2: axial st · axial · 0.96mm/px · z∈[-492,-57]mm · 12 of 99 slices shown, 14 images]
[im 8/99  soft-tissue]
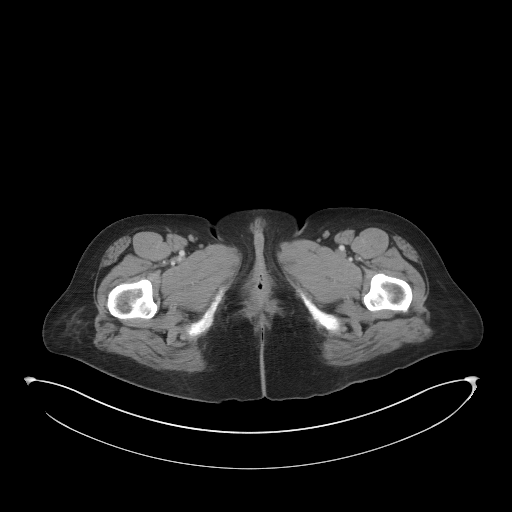
[im 8/99  bone]
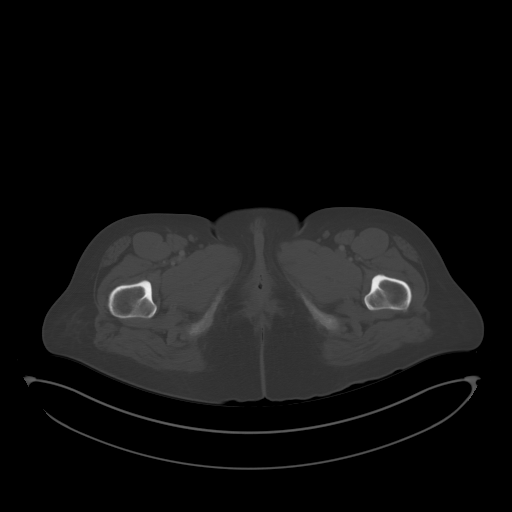
[im 16/99  soft-tissue]
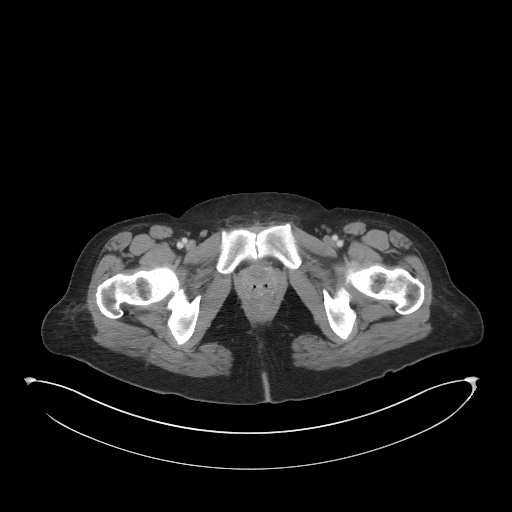
[im 24/99  soft-tissue]
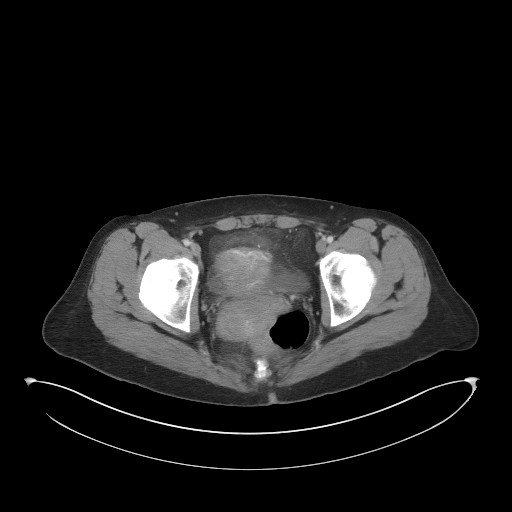
[im 32/99  soft-tissue]
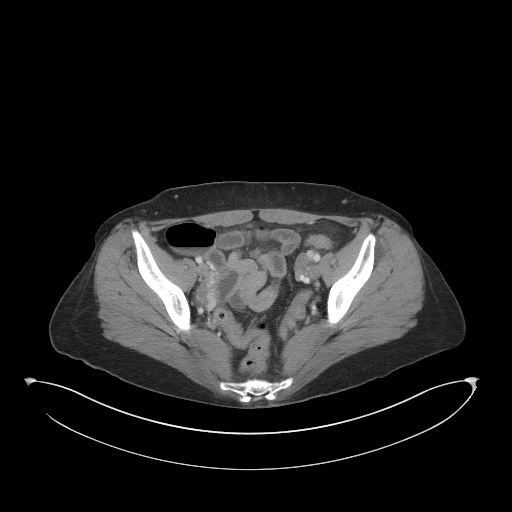
[im 40/99  soft-tissue]
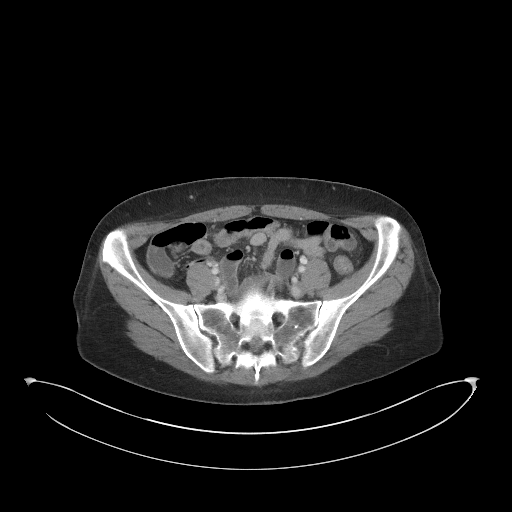
[im 48/99  soft-tissue]
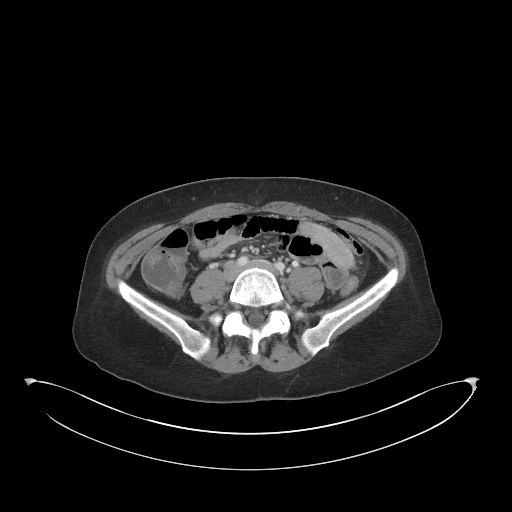
[im 55/99  soft-tissue]
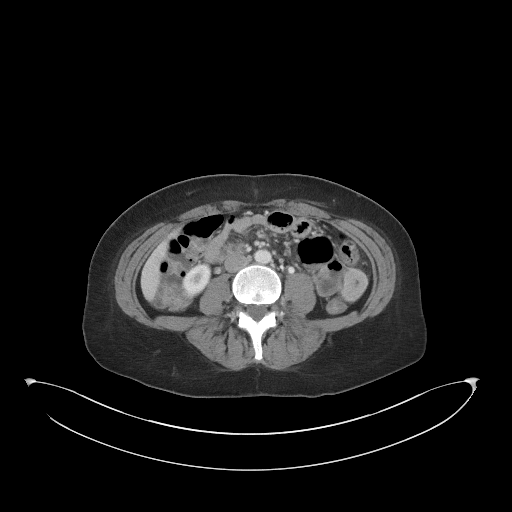
[im 63/99  soft-tissue]
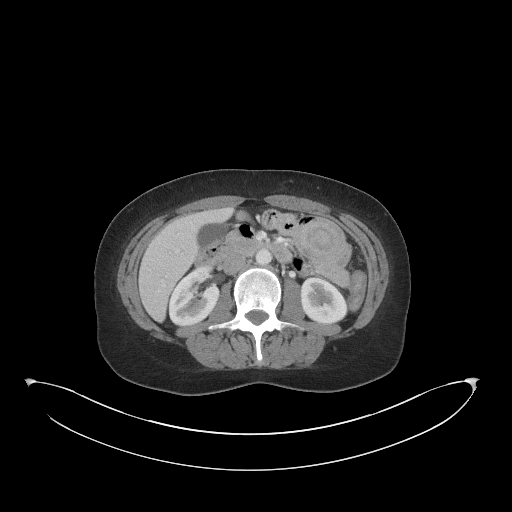
[im 71/99  soft-tissue]
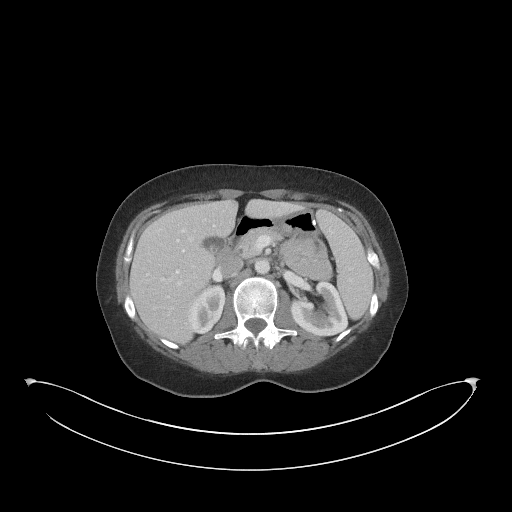
[im 71/99  bone]
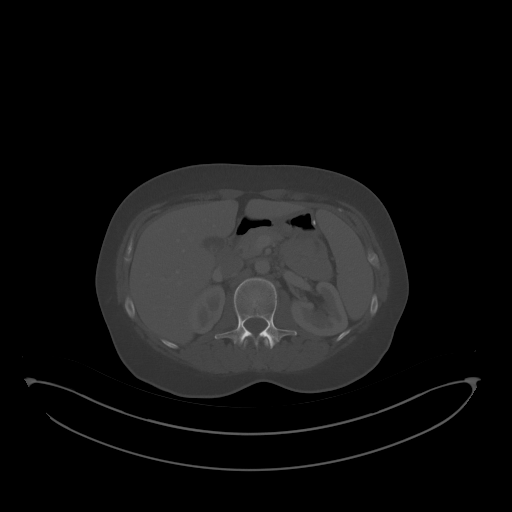
[im 79/99  soft-tissue]
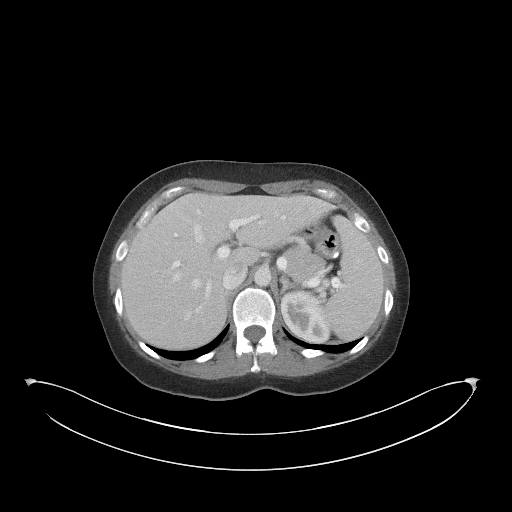
[im 87/99  soft-tissue]
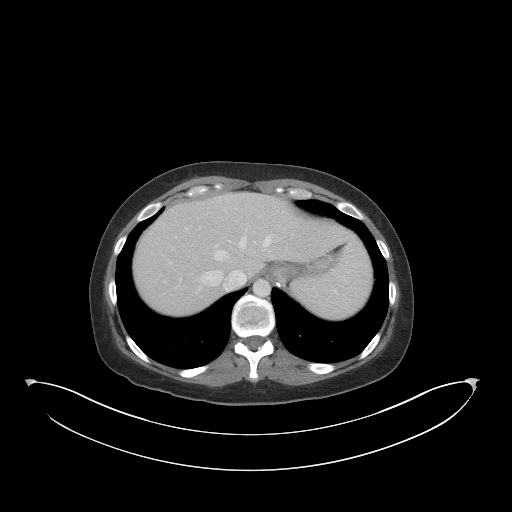
[im 95/99  soft-tissue]
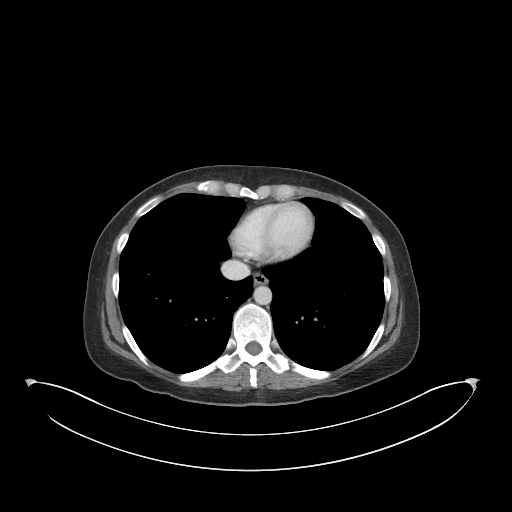

[Series 5: coronal st · coronal · 0.77mm/px · 3 of 80 slices shown]
[im 27/80  soft-tissue]
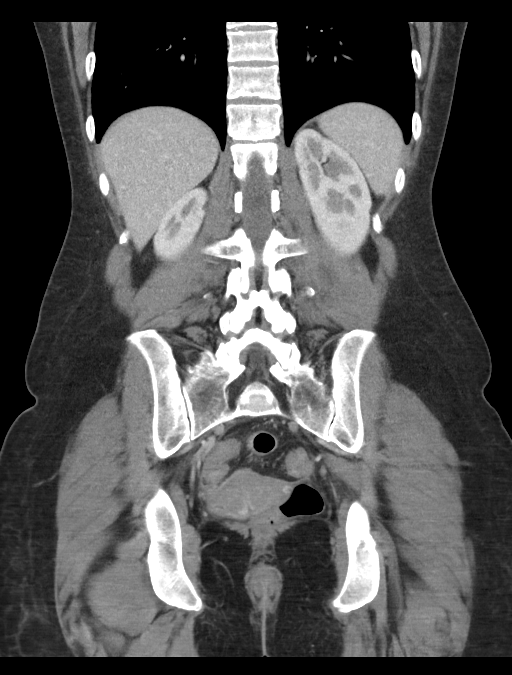
[im 36/80  soft-tissue]
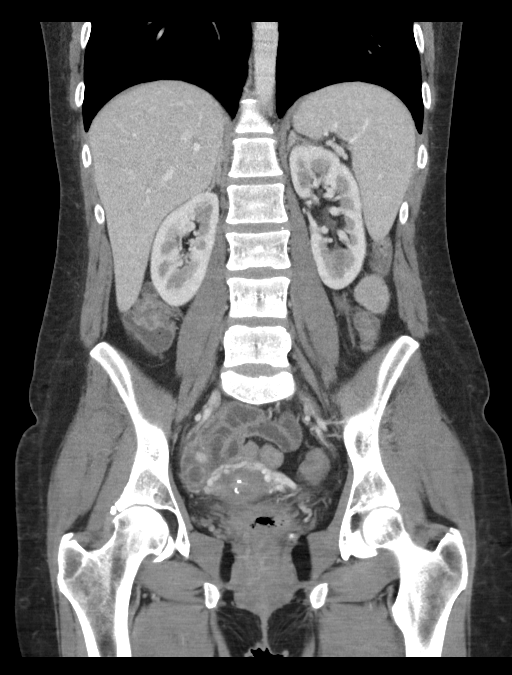
[im 44/80  soft-tissue]
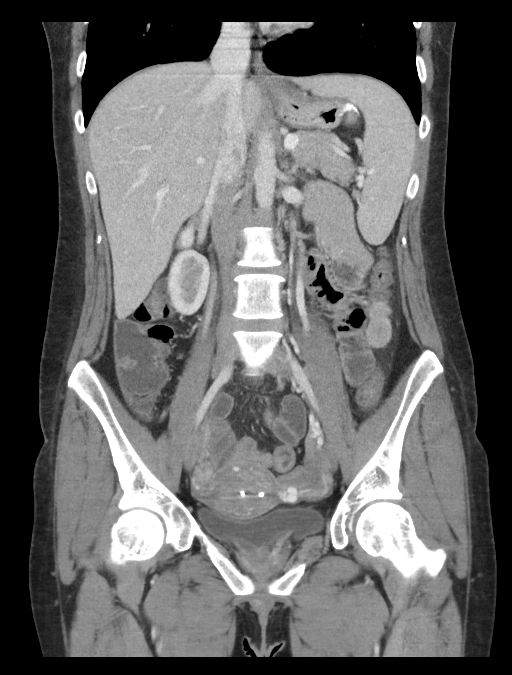

[15 of 46 positions shown; findings below may reference images not displayed]

FINDINGS: Lower chest: Lung bases are clear.

Hepatobiliary: No focal hepatic lesion. Several gallstones are
present without evidence acute cholecystitis. Stones are relatively
large at 8 mm each. Common bile duct normal caliber.

Pancreas: Pancreas is normal. No ductal dilatation. No pancreatic
inflammation.

Spleen: Normal spleen

Adrenals/urinary tract: Adrenal glands and kidneys are normal. The
ureters and bladder normal.

Stomach/Bowel: Post gastric sleeve bariatric surgery anatomy.
Stomach appears normal. Duodenum is normal.

In the proximal small bowel at the level of the ligament of Treitz
there is a enteric enteric intussusception measuring approximately 5
cm in length and well seen on coronal image 55/series 5. There is no
bowel dilatation proximal to this option. Distally the small bowel
appears normal. There is no mass lesion identified. On comparison CT
from 12/19/2018 there is a short intussusception at this same level
measuring approximately 2 cm (image 59/series 5, 01/15/2019).

The jejunum is normal. Terminal ileum normal. Appendix normal.
Ascending transverse colon normal. Descending colon rectosigmoid
colon.

Vascular/Lymphatic: Abdominal aorta is normal caliber. No periportal
or retroperitoneal adenopathy. No pelvic adenopathy.

Reproductive: IUD in expected location in the uterus. Adnexa normal.

Other: No free fluid.

Musculoskeletal: No aggressive osseous lesion.
IMPRESSION: 1. Relatively long enteric enteric intussusception in the proximal
small bowel measuring approximately 5-6 cm in length. While this may
represent a transient finding, in patient with LEFT upper quadrant
pain recommend surgical consultation. No lead point identified.
Short intussusception present on comparison CT 4848 indicating
chronic or recurrent intussusception.
2. Post bariatric surgery with gastric sleeve anatomy noted.
3. No evidence of bowel obstruction.

## 2023-08-22 IMAGING — CT CT ABD-PELV W/O CM
2 of 4 series · 16 of 46 positions shown, 18 images · non-contrast
Comparison: CT abdomen and pelvis dated January 10, 2021

CLINICAL DATA: enteroenteric intussusception.

EXAM:
CT ABDOMEN AND PELVIS WITHOUT CONTRAST
TECHNIQUE: Multidetector CT imaging of the abdomen and pelvis was performed
following the standard protocol without IV contrast.

[Series 2: axial st · axial · 0.77mm/px · z∈[-287,+168]mm · 13 of 103 slices shown, 15 images]
[im 6/103  soft-tissue]
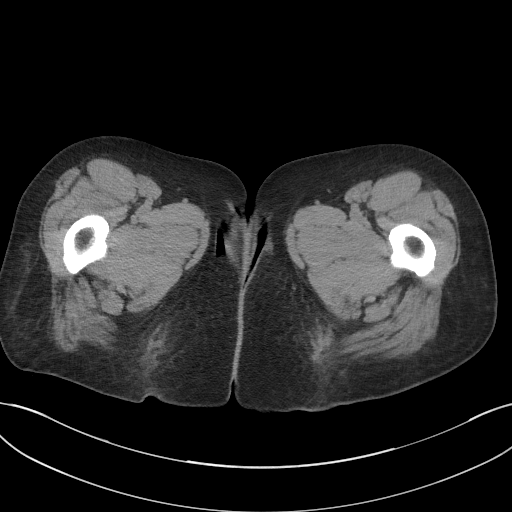
[im 6/103  bone]
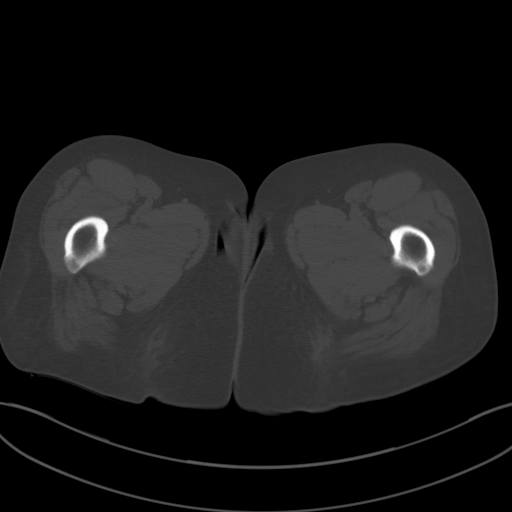
[im 12/103  soft-tissue]
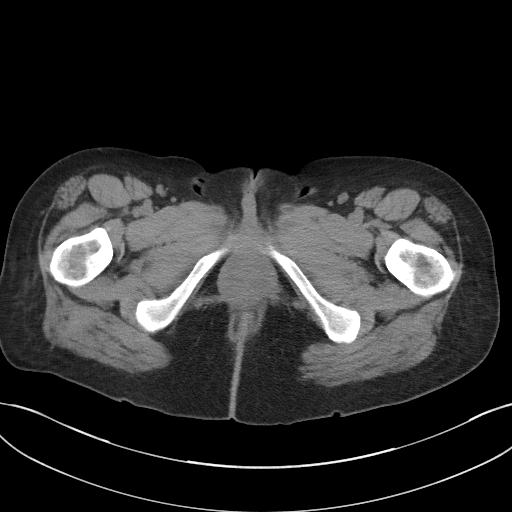
[im 23/103  soft-tissue]
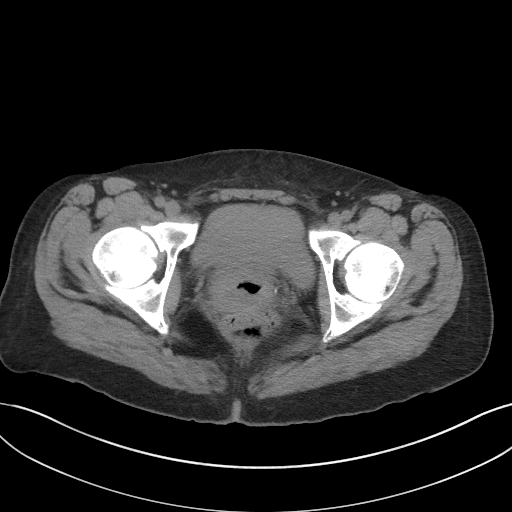
[im 29/103  soft-tissue]
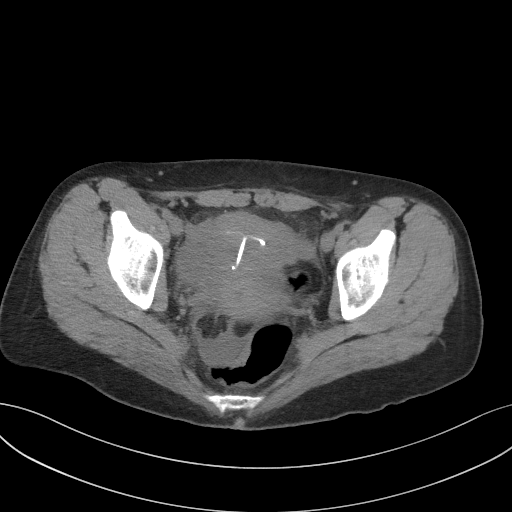
[im 35/103  soft-tissue]
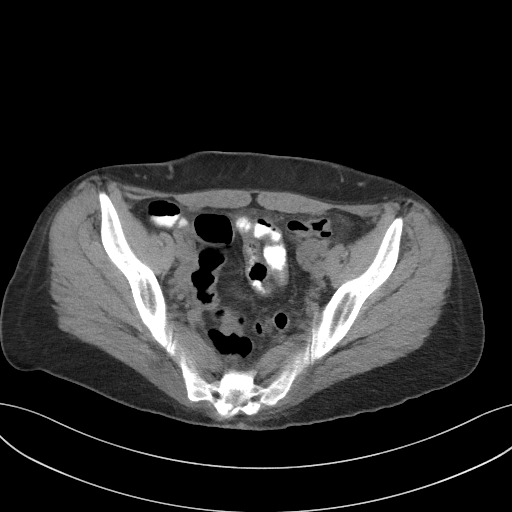
[im 46/103  soft-tissue]
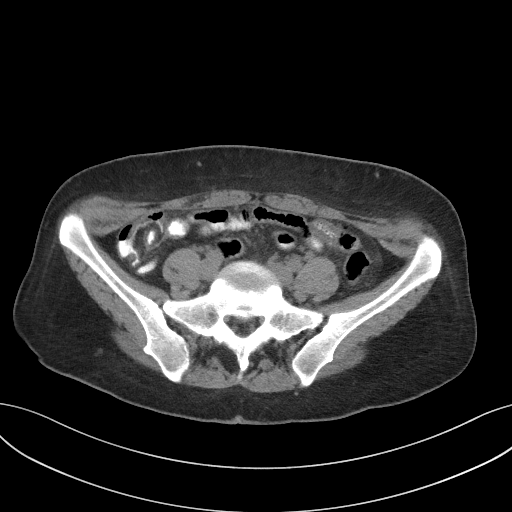
[im 52/103  soft-tissue]
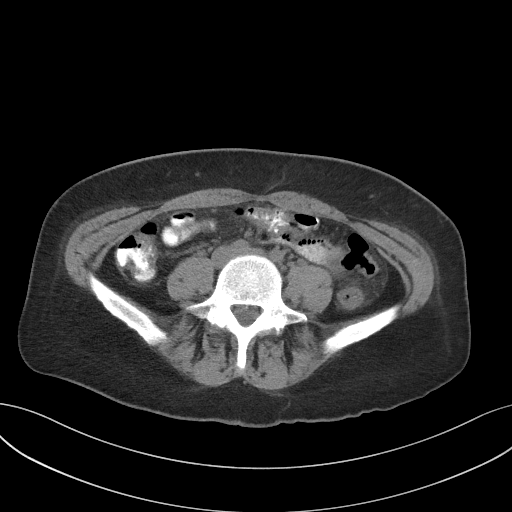
[im 57/103  soft-tissue]
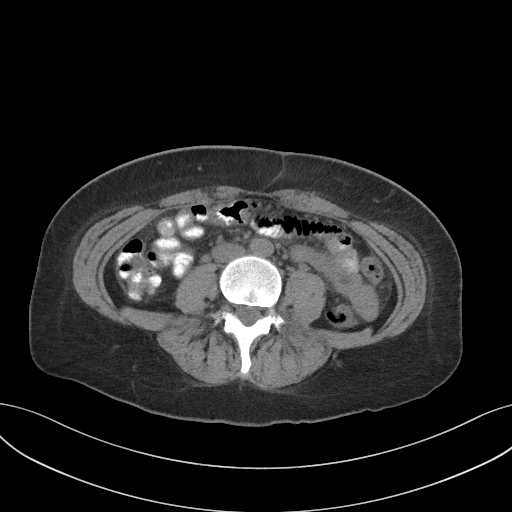
[im 69/103  soft-tissue]
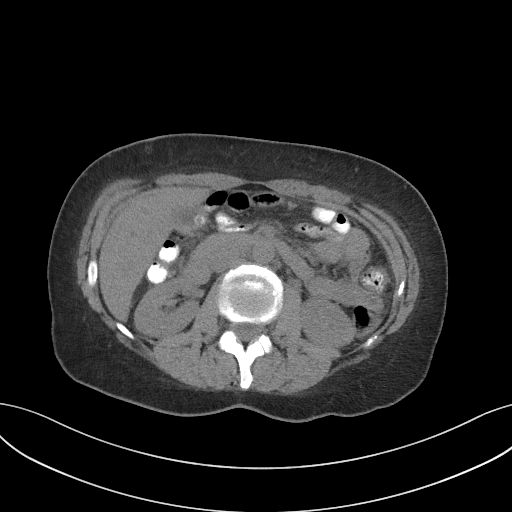
[im 69/103  bone]
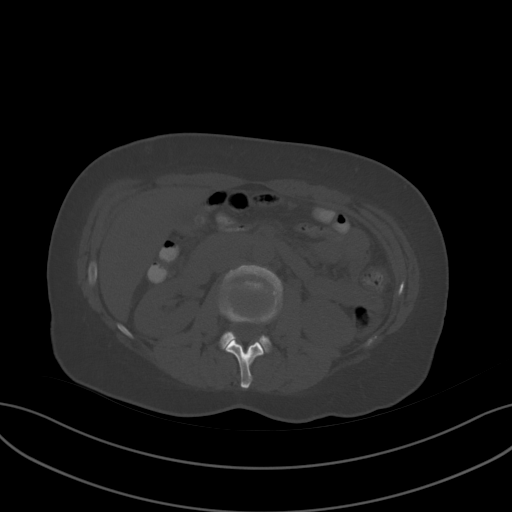
[im 74/103  soft-tissue]
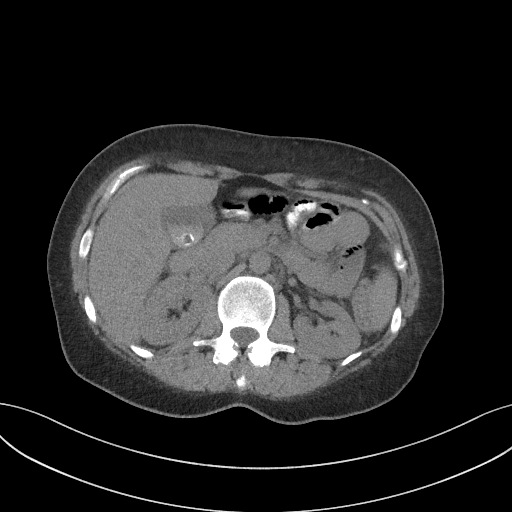
[im 80/103  soft-tissue]
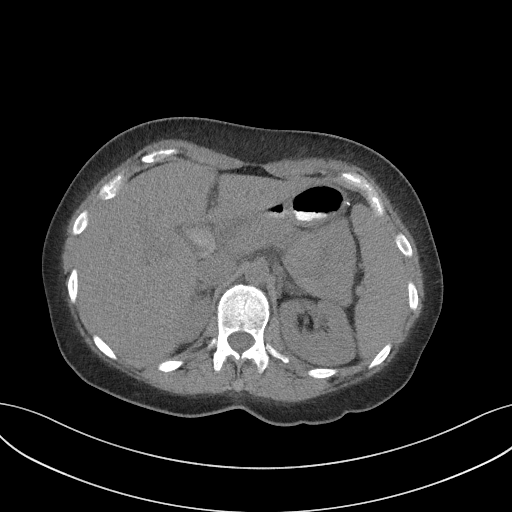
[im 91/103  soft-tissue]
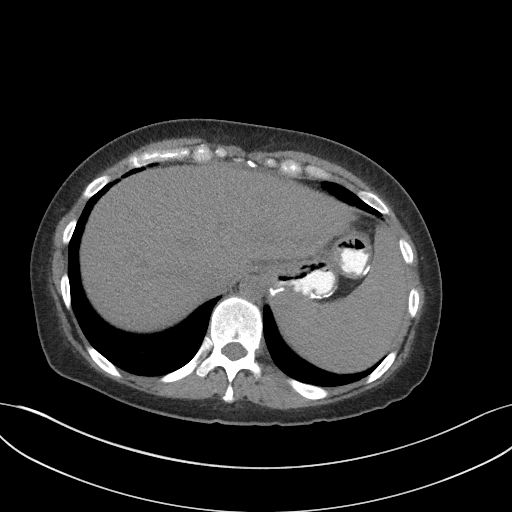
[im 97/103  soft-tissue]
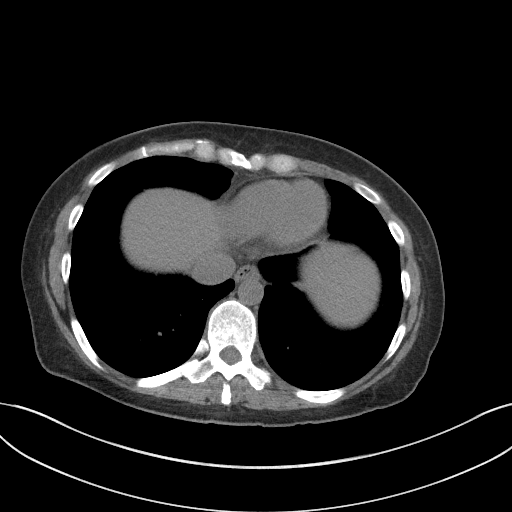

[Series 4: coronal st · coronal · 0.87mm/px · 3 of 77 slices shown]
[im 26/77  soft-tissue]
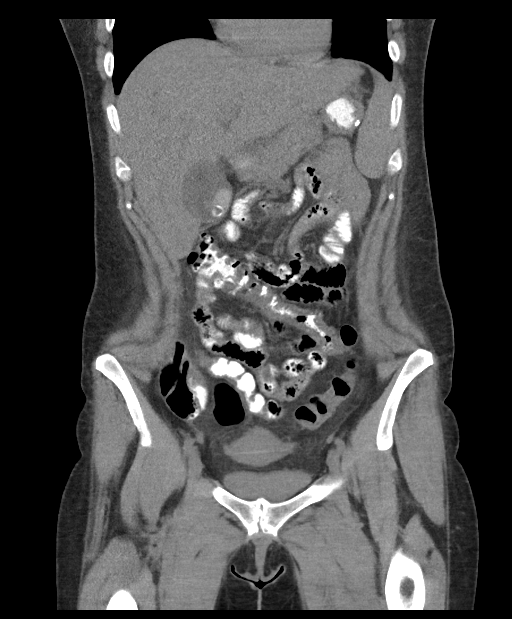
[im 34/77  soft-tissue]
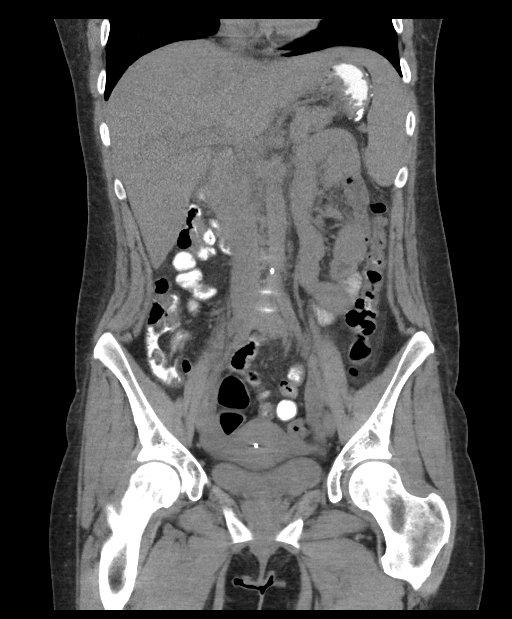
[im 43/77  soft-tissue]
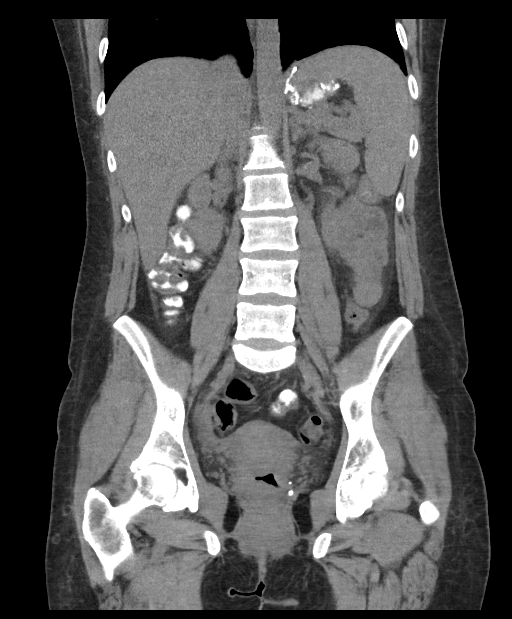

[16 of 46 positions shown; findings below may reference images not displayed]

FINDINGS: Lower chest: No acute abnormality.

Hepatobiliary: No focal liver abnormality. Cholelithiasis. Vicarious
excretion of contrast into the gallbladder. No biliary ductal
dilation.

Pancreas: Unremarkable.

Spleen: Unremarkable.

Adrenals/Urinary Tract: Adrenal glands are unremarkable. Kidneys are
normal, without renal calculi, focal lesion, or hydronephrosis.
Bladder is unremarkable.

Stomach/Bowel: Prior sleeve gastrectomy. Contrast material is seen
in the stomach to the level of the splenic flexure. Interval
resolution of entero enteric intussusception of the proximal small
bowel. Mild wall thickening of the proximal small bowel. Appendix is
unremarkable. No bowel wall thickening, obstruction or adjacent
inflammatory change.

Vascular/Lymphatic: Involve atherosclerotic disease of the abdominal
aorta. No pathologically enlarged lymph nodes in abdomen or pelvis.

Reproductive: IUD in expected location in the uterus. Bilateral
adnexa are unremarkable.

Other: No abdominal wall hernia or abnormality. Trace free fluid in
the pelvis.

Musculoskeletal: No acute or significant osseous findings.
IMPRESSION: Interval resolution of entero enteric intussusception of the
proximal small bowel.

Mild wall thickening of the proximal small bowel, findings can be
seen in the setting of enteritis.

Trace free fluid in the pelvis.

## 2023-08-23 ENCOUNTER — Other Ambulatory Visit (HOSPITAL_BASED_OUTPATIENT_CLINIC_OR_DEPARTMENT_OTHER): Payer: Self-pay

## 2023-08-23 ENCOUNTER — Encounter: Payer: Self-pay | Admitting: Family Medicine

## 2023-08-23 ENCOUNTER — Ambulatory Visit: Admitting: Family Medicine

## 2023-08-23 VITALS — BP 128/86 | HR 64 | Temp 97.4°F | Ht 68.0 in | Wt 171.0 lb

## 2023-08-23 DIAGNOSIS — R10A0 Flank pain, unspecified side: Secondary | ICD-10-CM

## 2023-08-23 DIAGNOSIS — R109 Unspecified abdominal pain: Secondary | ICD-10-CM | POA: Diagnosis not present

## 2023-08-23 DIAGNOSIS — J0100 Acute maxillary sinusitis, unspecified: Secondary | ICD-10-CM | POA: Diagnosis not present

## 2023-08-23 LAB — POC URINALSYSI DIPSTICK (AUTOMATED)
Bilirubin, UA: NEGATIVE
Blood, UA: NEGATIVE
Glucose, UA: NEGATIVE
Ketones, UA: NEGATIVE
Leukocytes, UA: NEGATIVE
Nitrite, UA: NEGATIVE
Protein, UA: NEGATIVE
Spec Grav, UA: <=1.005 — AB (ref 1.010–1.025)
Urobilinogen, UA: 0.2 U/dL
pH, UA: 6 (ref 5.0–8.0)

## 2023-08-23 MED ORDER — AMOXICILLIN-POT CLAVULANATE 875-125 MG PO TABS
1.0000 | ORAL_TABLET | Freq: Two times a day (BID) | ORAL | 0 refills | Status: AC
  Filled 2023-08-23: qty 14, 7d supply, fill #0

## 2023-08-23 MED ORDER — FLUTICASONE PROPIONATE 50 MCG/ACT NA SUSP
2.0000 | Freq: Every day | NASAL | 6 refills | Status: DC
  Filled 2023-08-23: qty 16, 30d supply, fill #0

## 2023-08-23 MED ORDER — CETIRIZINE HCL 10 MG PO TABS
10.0000 mg | ORAL_TABLET | Freq: Every day | ORAL | 11 refills | Status: DC
  Filled 2023-08-23: qty 30, 30d supply, fill #0

## 2023-08-23 NOTE — Progress Notes (Signed)
 Acute Office Visit  Subjective:     Patient ID: Patricia Mcmahon, female    DOB: 02-06-1986, 38 y.o.   MRN: [id number]  Chief Complaint  Patient presents with   Nasal Congestion    HPI Patient is in today for nasal congestion, urine odor.  Discussed the use of AI scribe software for clinical note transcription with the patient, who gave verbal consent to proceed.  History of Present Illness Patricia Mcmahon is a 38 year old female who presents with symptoms suggestive of a urinary tract infection and persistent allergy symptoms.  She has been experiencing symptoms suggestive of a urinary tract infection for approximately one week. She notes a strong odor in her urine, although the color remains light yellow. No frequency, urgency, or dysuria, but she feels significant fatigue and has left flank pain. No hematuria has been noticed.  In addition to the urinary symptoms, she has significant congestion and fatigue, initially attributed to allergies. She recently started a new job and suspects she may have picked up something there. Symptoms include rhinorrhea, itchy and watery eyes, and a sensation of pressure under her eyes, persisting for about a week. No sneezing or coughing, but constant nose blowing. She has been using Sudafed PE and azelastine without relief.  She denies having a fever but describes feeling 'hot, cold' No otalgia or dyspnea. She is currently taking over-the-counter allergy medications.      ROS All review of systems negative except what is listed in the HPI      Objective:    BP 128/86   Pulse 64   Temp (!) 97.4 F (36.3 C) (Oral)   Ht 5\' 8"  (1.727 m)   Wt 171 lb (77.6 kg)   SpO2 100%   BMI 26.00 kg/m    Physical Exam Vitals reviewed.  Constitutional:      Appearance: Normal appearance.  Cardiovascular:     Rate and Rhythm: Normal rate and regular rhythm.  Pulmonary:     Effort: Pulmonary effort is normal.     Breath sounds: No wheezing or  rales.  Abdominal:     Tenderness: There is no abdominal tenderness. There is no right CVA tenderness, left CVA tenderness or guarding.  Skin:    General: Skin is warm and dry.  Neurological:     Mental Status: She is alert and oriented to person, place, and time.  Psychiatric:        Mood and Affect: Mood normal.        Behavior: Behavior normal.        Judgment: Judgment normal.     Results for orders placed or performed in visit on 08/23/23  POCT Urinalysis Dipstick (Automated)  Result Value Ref Range   Color, UA yellow    Clarity, UA clear    Glucose, UA Negative Negative   Bilirubin, UA negative    Ketones, UA negative    Spec Grav, UA <=1.005 (A) 1.010 - 1.025   Blood, UA negative    pH, UA 6.0 5.0 - 8.0   Protein, UA Negative Negative   Urobilinogen, UA 0.2 0.2 or 1.0 E.U./dL   Nitrite, UA negative    Leukocytes, UA Negative Negative        Assessment & Plan:   Problem List Items Addressed This Visit   None Visit Diagnoses       Flank pain    -  Primary   Relevant Orders   POCT Urinalysis Dipstick (Automated) (Completed)  Urine Culture     Acute non-recurrent maxillary sinusitis       Relevant Medications   fluticasone  (FLONASE ) 50 MCG/ACT nasal spray   cetirizine (ZYRTEC) 10 MG tablet   amoxicillin -clavulanate (AUGMENTIN ) 875-125 MG tablet       Assessment & Plan  Persistent nasal congestion, rhinorrhea, and itchy, watery eyes suggest allergic rhinitis. Current treatment ineffective. - Prescribe Flonase  nasal spray. - Prescribe Zyrtec. - Monitor symptoms; consider antibiotics if no improvement by Saturday. Possible progression from allergic rhinitis to sinusitis due to persistent congestion and facial pressure. - Start Augmentin  if no improvement by Saturday or if severe sinus pain or fever develops.   Questionable UTI based on flank pain and strong-smelling urine. No dysuria, frequency, urgency, or hematuria. - UA normal day, will send for  culture. - Advise increased fluid intake.    Meds ordered this encounter  Medications   fluticasone  (FLONASE ) 50 MCG/ACT nasal spray    Sig: Place 2 sprays into both nostrils daily.    Dispense:  16 g    Refill:  6    Supervising Provider:   Randie Bustle A [[id number]]   cetirizine (ZYRTEC) 10 MG tablet    Sig: Take 1 tablet (10 mg total) by mouth daily.    Dispense:  30 tablet    Refill:  11    Supervising Provider:   Randie Bustle A [[id number]]   amoxicillin -clavulanate (AUGMENTIN ) 875-125 MG tablet    Sig: Take 1 tablet by mouth 2 (two) times daily for 7 days.    Dispense:  14 tablet    Refill:  0    Supervising Provider:   Randie Bustle A [[id number]]    Return if symptoms worsen or fail to improve.  Everlina Hock, NP

## 2023-08-28 ENCOUNTER — Encounter: Payer: Self-pay | Admitting: Family Medicine

## 2023-08-28 DIAGNOSIS — L659 Nonscarring hair loss, unspecified: Secondary | ICD-10-CM

## 2023-08-28 NOTE — Telephone Encounter (Signed)
 Looks like just specific gravity.

## 2023-08-29 NOTE — Addendum Note (Signed)
 Addended by: Minna Amass B on: 08/29/2023 11:43 AM   Modules accepted: Orders

## 2023-08-30 ENCOUNTER — Other Ambulatory Visit (HOSPITAL_COMMUNITY): Payer: Self-pay | Admitting: Physician Assistant

## 2023-08-30 DIAGNOSIS — F3162 Bipolar disorder, current episode mixed, moderate: Secondary | ICD-10-CM

## 2023-08-31 NOTE — Telephone Encounter (Signed)
 Referral sent to [facility] derm per patient request.  MyChart message sent.

## 2023-09-03 ENCOUNTER — Telehealth: Payer: Self-pay | Admitting: Family Medicine

## 2023-09-03 NOTE — Telephone Encounter (Signed)
 Can we forward Derm referral to [facility] per patient request?

## 2023-09-03 NOTE — Telephone Encounter (Signed)
 Copied from CRM [id number]. Topic: Referral - Request for Referral >> Sep 03, 2023  3:55 PM Baldo Levan wrote: Did the patient discuss referral with their provider in the last year? Yes (If No - schedule appointment) (If Yes - send message)  Appointment offered? Yes  Type of order/referral and detailed reason for visit: Dermatology  Preference of office, provider, location: [facility] in [facility]  If referral order, have you been seen by this specialty before? No (If Yes, this issue or another issue? When? Where?  Can we respond through MyChart? Yes  Patient has a referral placed for [facility] patient stated they are booking too far out, and asked if the referral could be switched.

## 2023-09-04 ENCOUNTER — Telehealth (HOSPITAL_COMMUNITY): Payer: Self-pay | Admitting: Physician Assistant

## 2023-09-06 ENCOUNTER — Other Ambulatory Visit (HOSPITAL_COMMUNITY): Payer: Self-pay | Admitting: Physician Assistant

## 2023-09-06 DIAGNOSIS — F3162 Bipolar disorder, current episode mixed, moderate: Secondary | ICD-10-CM

## 2023-09-06 DIAGNOSIS — F411 Generalized anxiety disorder: Secondary | ICD-10-CM

## 2023-09-06 MED ORDER — BUSPIRONE HCL 15 MG PO TABS: 15.0000 mg | ORAL_TABLET | Freq: Two times a day (BID) | ORAL | 0 refills | Status: DC

## 2023-09-06 MED ORDER — LAMOTRIGINE 150 MG PO TABS: 150.0000 mg | ORAL_TABLET | Freq: Every day | ORAL | 0 refills | Status: DC

## 2023-09-06 MED ORDER — OLANZAPINE 5 MG PO TABS: 5.0000 mg | ORAL_TABLET | Freq: Every day | ORAL | 0 refills | Status: DC

## 2023-09-06 NOTE — Telephone Encounter (Signed)
 Patient was given a 30-day supply of their medications. [name] appointment needs to be rescheduled.

## 2023-09-06 NOTE — Telephone Encounter (Signed)
 Message acknowledged and reviewed.

## 2023-09-06 NOTE — Progress Notes (Signed)
 Patient to be e-prescribed a 30-day supply of their medications.

## 2023-09-06 NOTE — Telephone Encounter (Signed)
 Pt called to follow up on medication refill request. She needs all 3 meds refilled and sent to pharmacy on file. [address], [place]. Last seen on 05/29/23. Next appt scheduled for 09/28/23.

## 2023-09-07 ENCOUNTER — Ambulatory Visit: Payer: Self-pay

## 2023-09-07 ENCOUNTER — Telehealth: Payer: Self-pay | Admitting: Family Medicine

## 2023-09-07 NOTE — Telephone Encounter (Signed)
  Chief Complaint: fatigue Symptoms: fatigue Frequency: x 1 week Pertinent Negatives: Patient denies sob Disposition: [] ED /[] Urgent Care (no appt availability in office) / [x] Appointment(In office/virtual)/ []  [facility]/ [] Home Care/ [] Refused Recommended Disposition /[] [facility]/ []  Follow-up with PCP Additional Notes: pt states that she saw her pcp a couple weeks ago for this but also had cold symptoms. States she is still experiencing the fatigue but no longer having the other symptoms.  States she was fine last week and fatigue started back this week.  States sleeping week at night and no recently life changes.   Copied from CRM [id number]. Topic: Clinical - Red Word Triage >> Sep 07, 2023 10:42 AM Patricia Mcmahon wrote: Red Word that prompted transfer to Nurse Triage: cannot stay awake/cannot wake up-extremely fatigue Reason for Disposition  [1] Fatigue (i.e., tires easily, decreased energy) AND [2] persists > 1 week  Answer Assessment - Initial Assessment Questions 1. DESCRIPTION: "Describe how you are feeling."     tired 2. SEVERITY: "How bad is it?"  "Can you stand and walk?"   - MILD (0-3): Feels weak or tired, but does not interfere with work, school or normal activities.   - MODERATE (4-7): Able to stand and walk; weakness interferes with work, school, or normal activities.   - SEVERE (8-10): Unable to stand or walk; unable to do usual activities.     mod 3. ONSET: "When did these symptoms begin?" (e.g., hours, days, weeks, months)     3 weeks 4. CAUSE: "What do you think is causing the weakness or fatigue?" (e.g., not drinking enough fluids, medical problem, trouble sleeping)     unknown 5. NEW MEDICINES:  "Have you started on any new medicines recently?" (e.g., opioid pain medicines, benzodiazepines, muscle relaxants, antidepressants, antihistamines, neuroleptics, beta blockers)     no 6. OTHER SYMPTOMS: "Do you have any other symptoms?" (e.g., chest pain,  fever, cough, SOB, vomiting, diarrhea, bleeding, other areas of pain)     no 7. PREGNANCY: "Is there any chance you are pregnant?" "When was your last menstrual period?"   no  Protocols used: Weakness (Generalized) and Fatigue-A-AH

## 2023-09-07 NOTE — Telephone Encounter (Signed)
 Copied from CRM [id number]. Topic: Referral - Status >> Sep 07, 2023 12:15 PM Chuck Crater wrote: Reason for CRM: Patient is checking on her referral to [facility] in [facility]. They said that they haven't received anything yet. Please contact patient.

## 2023-09-07 NOTE — Telephone Encounter (Signed)
 See telephone note from 09/03/23- can we have derm referral sent to [facility] please?

## 2023-09-11 ENCOUNTER — Ambulatory Visit: Admitting: Family Medicine

## 2023-09-11 NOTE — Telephone Encounter (Signed)
 Referral sent to [facility].

## 2023-09-12 ENCOUNTER — Telehealth: Payer: Self-pay | Admitting: Family Medicine

## 2023-09-12 NOTE — Telephone Encounter (Signed)
 Copied from CRM [id number]. Topic: Referral - Question >> Sep 12, 2023  2:47 PM Juleen Oakland F wrote: Reason for CRM: patient called requesting that Dermatology referral be faxed to the correct fax number [phone]

## 2023-09-13 ENCOUNTER — Ambulatory Visit: Admitting: Family Medicine

## 2023-09-14 ENCOUNTER — Telehealth: Payer: Self-pay

## 2023-09-14 NOTE — Telephone Encounter (Signed)
 Copied from CRM [id number]. Topic: Referral - Status >> Sep 14, 2023 11:15 AM Dimple Francis wrote: Reason for CRM: [facility] call to check on fax for referral for Dermatology, they did not receive it, if it can be faxed again fax number [phone]

## 2023-09-14 NOTE — Telephone Encounter (Signed)
 Patient called back and stated that yesterday at 4p, [facility] called her and stated that they haven't received the referral.

## 2023-09-18 NOTE — Telephone Encounter (Signed)
 Copied from CRM [id number]. Topic: Referral - Question >> Sep 18, 2023  3:56 PM Patricia Mcmahon P wrote: Reason for CRM: [facility] advise they are needing a fax with PT demographics fax to [phone]

## 2023-09-27 ENCOUNTER — Ambulatory Visit
Admission: RE | Admit: 2023-09-27 | Discharge: 2023-09-27 | Disposition: A | Discharge status: Home / Self Care | Source: Ambulatory Visit | Attending: Family Medicine

## 2023-09-27 VITALS — BP 108/73 | HR 98 | Temp 98.3°F | Resp 18

## 2023-09-27 DIAGNOSIS — D649 Anemia, unspecified: Secondary | ICD-10-CM

## 2023-09-27 DIAGNOSIS — R5383 Other fatigue: Secondary | ICD-10-CM | POA: Diagnosis not present

## 2023-09-27 MED ORDER — FERROUS SULFATE 324 (65 FE) MG PO TBEC
1.0000 | DELAYED_RELEASE_TABLET | Freq: Two times a day (BID) | ORAL | 0 refills | Status: DC
Start: 2023-09-27 — End: 2023-12-13

## 2023-09-27 NOTE — ED Triage Notes (Signed)
 Low energy,fatigue, and dehydrated x 3 weeks. Patient states she feels better when she increases her intake.  Denies any fever,chills,diarrhea or vomiting.

## 2023-09-27 NOTE — ED Provider Notes (Signed)
 [facility]  Note:  This document was prepared using Conservation officer, historic buildings and may include unintentional dictation errors.  MRN: [id number] DOB: 07-18-1985  Subjective:   Patricia Mcmahon is a 38 y.o. female presenting for 3 week history of persistent fatigue, decreased energy, easily getting cold. At times gets winded easily. No active fever, headache, confusion, chest pain, shob, wheezing, n/v, abdominal pain, rashes.   No current facility-administered medications for this encounter.  Current Outpatient Medications:    Azelastine HCl 137 MCG/SPRAY SOLN, Place into both nostrils., Disp: , Rfl:    busPIRone  (BUSPAR ) 15 MG tablet, Take 1 tablet (15 mg total) by mouth 2 (two) times daily., Disp: 60 tablet, Rfl: 0   cetirizine  (ZYRTEC ) 10 MG tablet, Take 1 tablet (10 mg total) by mouth daily., Disp: 30 tablet, Rfl: 11   Cholecalciferol (VITAMIN D3) 25 MCG (1000 UT) CAPS, Take 1 capsule (1,000 Units total) by mouth daily., Disp: 90 capsule, Rfl: 3   cyanocobalamin  (VITAMIN B12) 1000 MCG tablet, Take 1 tablet (1,000 mcg total) by mouth daily., Disp: 90 tablet, Rfl: 1   fluticasone  (FLONASE ) 50 MCG/ACT nasal spray, Place 2 sprays into both nostrils daily., Disp: 16 g, Rfl: 6   Ibuprofen  (ADVIL  PO), Take 2 capsules by mouth daily as needed. Gel Capsules, Disp: , Rfl:    lamoTRIgine  (LAMICTAL ) 150 MG tablet, Take 1 tablet (150 mg total) by mouth at bedtime., Disp: 30 tablet, Rfl: 0   Multiple Vitamins-Minerals (MULTIVITAMIN WITH MINERALS) tablet, Take 1 tablet by mouth daily., Disp: 90 tablet, Rfl: 1   OLANZapine  (ZYPREXA ) 5 MG tablet, Take 1 tablet (5 mg total) by mouth at bedtime., Disp: 30 tablet, Rfl: 0   Allergies  Allergen Reactions   Morphine Other (See Comments)    Behavior changes - becomes hostile/violent    Past Medical History:  Diagnosis Date   GERD (gastroesophageal reflux disease)    no current med.   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   Left knee pain 12/16/2012   Thrombocytopenia (HCC) 08/16/2011     Past Surgical History:  Procedure Laterality Date   CESAREAN SECTION     ESOPHAGOGASTRODUODENOSCOPY N/A 01/14/2021   Procedure: ESOPHAGOGASTRODUODENOSCOPY (EGD);  Surgeon: Evangeline Hilts, MD;  Location: [facility];  Service: Endoscopy;  Laterality: N/A;   GASTRECTOMY     sleeve   KNEE ARTHROSCOPY  04/02/2012   Procedure: ARTHROSCOPY KNEE;  Surgeon: Alphonzo Ask, MD;  Location: [facility];  Service: Orthopedics;  Laterality: Left;  left knee arthroscopy chondroplasty   KNEE ARTHROSCOPY WITH LATERAL RELEASE Left 01/07/2013   Procedure: LEFT KNEE ARTHROSCOPY, CHONDROPLASTY AND  LATERAL RELEASE;  Surgeon: Alphonzo Ask, MD;  Location: [facility];  Service: Orthopedics;  Laterality: Left;    Family History  Problem Relation Age of Onset   Alcohol abuse Mother    Cancer Mother    Learning disabilities Father    Alcohol abuse Father    Heart disease Maternal Grandmother    Diabetes Maternal Grandfather     Social History   Tobacco Use   Smoking status: Every Day    Current packs/day: 1.00    Average packs/day: 1 pack/day for 16.0 years (16.0 ttl pk-yrs)    Types: Cigarettes   Smokeless tobacco: Never  Vaping Use   Vaping status: Never Used  Substance Use Topics   Alcohol use: Yes    Comment: socially   Drug use: No    ROS  Objective:   Vitals: BP 108/73 (BP Location: Left Arm)   Pulse 98   Temp 98.3 F (36.8 C) (Oral)   Resp 18   SpO2 98%   Physical Exam Constitutional:      General: She is not in acute distress.    Appearance: Normal appearance. She is well-developed. She is not ill-appearing, toxic-appearing or diaphoretic.  HENT:     Head: Normocephalic and atraumatic.     Nose: Nose normal.     Mouth/Throat:     Mouth: Mucous membranes are moist.   Eyes:     General: No scleral icterus.       Right eye: No discharge.        Left eye:  No discharge.     Extraocular Movements: Extraocular movements intact.   Neck:     Thyroid : No thyroid  mass, thyromegaly or thyroid  tenderness.   Cardiovascular:     Rate and Rhythm: Normal rate and regular rhythm.     Heart sounds: Normal heart sounds. No murmur heard.    No friction rub. No gallop.  Pulmonary:     Effort: Pulmonary effort is normal. No respiratory distress.     Breath sounds: No stridor. No wheezing, rhonchi or rales.  Chest:     Chest wall: No tenderness.   Skin:    General: Skin is warm and dry.   Neurological:     General: No focal deficit present.     Mental Status: She is alert and oriented to person, place, and time.   Psychiatric:        Mood and Affect: Mood normal.        Behavior: Behavior normal.     Recent Results (from the past 2160 hours)  Comprehensive metabolic panel with GFR     Status: None   Collection Time: 07/16/23  3:21 PM  Result Value Ref Range   Sodium 140 135 - 145 mEq/L   Potassium 4.0 3.5 - 5.1 mEq/L   Chloride 108 96 - 112 mEq/L   CO2 26 19 - 32 mEq/L   Glucose, Bld 85 70 - 99 mg/dL   BUN 11 6 - 23 mg/dL   Creatinine, Ser 6.57 0.40 - 1.20 mg/dL   Total Bilirubin 0.4 0.2 - 1.2 mg/dL   Alkaline Phosphatase 64 39 - 117 U/L   AST 12 0 - 37 U/L   ALT 7 0 - 35 U/L   Total Protein 6.3 6.0 - 8.3 g/dL   Albumin 4.0 3.5 - 5.2 g/dL   GFR 84.69 >62.95 mL/min    Comment: Calculated using the CKD-EPI Creatinine Equation (2021)   Calcium 9.3 8.4 - 10.5 mg/dL  Lipid panel     Status: None   Collection Time: 07/16/23  3:21 PM  Result Value Ref Range   Cholesterol 114 0 - 200 mg/dL    Comment: ATP III Classification       Desirable:  < 200 mg/dL               Borderline High:  200 - 239 mg/dL          High:  > = 284 mg/dL   Triglycerides 13.2 0.0 - 149.0 mg/dL    Comment: Normal:  <440 mg/dLBorderline High:  150 - 199 mg/dL   HDL 10.27 >25.36 mg/dL   VLDL 64.4 0.0 - 03.4 mg/dL   LDL Cholesterol 38 0 - 99 mg/dL   Total CHOL/HDL  Ratio 2     Comment:  Men          Women1/2 Average Risk     3.4          3.3Average Risk          5.0          4.42X Average Risk          9.6          7.13X Average Risk          15.0          11.0                       NonHDL 55.25     Comment: NOTE:  Non-HDL goal should be 30 mg/dL higher than patient's LDL goal (i.e. LDL goal of < 70 mg/dL, would have non-HDL goal of < 100 mg/dL)  TSH     Status: None   Collection Time: 07/16/23  3:21 PM  Result Value Ref Range   TSH 0.47 0.35 - 5.50 uIU/mL  VITAMIN D  25 Hydroxy (Vit-D Deficiency, Fractures)     Status: None   Collection Time: 07/16/23  3:21 PM  Result Value Ref Range   VITD 31.44 30.00 - 100.00 ng/mL  POCT Urinalysis Dipstick (Automated)     Status: Abnormal   Collection Time: 08/23/23  3:56 PM  Result Value Ref Range   Color, UA yellow    Clarity, UA clear    Glucose, UA Negative Negative   Bilirubin, UA negative    Ketones, UA negative    Spec Grav, UA <=1.005 (A) 1.010 - 1.025   Blood, UA negative    pH, UA 6.0 5.0 - 8.0   Protein, UA Negative Negative   Urobilinogen, UA 0.2 0.2 or 1.0 E.U./dL   Nitrite, UA negative    Leukocytes, UA Negative Negative     Assessment and Plan :   PDMP not reviewed this encounter.  1. Anemia, unspecified type   2. Fatigue, unspecified type    Recent labs reviewed in detail with the patient.  Recommended patient start an iron supplement.  She plans on following up with her PCP ASAP.  No signs of an acute emergency.  Low PERC score.  Vital signs hemodynamically stable for outpatient management.  Counseled patient on potential for adverse effects with medications prescribed/recommended today, ER and return-to-clinic precautions discussed, patient verbalized understanding.    Adolph Hoop, [place] 10/01/23 [phone]

## 2023-09-28 ENCOUNTER — Telehealth (HOSPITAL_COMMUNITY): Admitting: Physician Assistant

## 2023-10-04 ENCOUNTER — Other Ambulatory Visit (HOSPITAL_COMMUNITY): Payer: Self-pay | Admitting: Physician Assistant

## 2023-10-04 ENCOUNTER — Other Ambulatory Visit: Payer: Self-pay | Admitting: Family Medicine

## 2023-10-04 DIAGNOSIS — J189 Pneumonia, unspecified organism: Secondary | ICD-10-CM

## 2023-10-04 DIAGNOSIS — F411 Generalized anxiety disorder: Secondary | ICD-10-CM

## 2023-10-04 DIAGNOSIS — F3162 Bipolar disorder, current episode mixed, moderate: Secondary | ICD-10-CM

## 2023-10-16 ENCOUNTER — Telehealth (HOSPITAL_COMMUNITY): Admitting: Physician Assistant

## 2023-10-16 DIAGNOSIS — F3162 Bipolar disorder, current episode mixed, moderate: Secondary | ICD-10-CM | POA: Diagnosis not present

## 2023-10-16 DIAGNOSIS — F411 Generalized anxiety disorder: Secondary | ICD-10-CM | POA: Diagnosis not present

## 2023-11-14 ENCOUNTER — Telehealth: Payer: Self-pay | Admitting: Neurology

## 2023-11-14 DIAGNOSIS — D649 Anemia, unspecified: Secondary | ICD-10-CM

## 2023-11-14 NOTE — Telephone Encounter (Signed)
 Lab ordered. Patient needs to look at her schedule and call back to schedule appt. Please schedule lab appt when patient calls back.

## 2023-11-14 NOTE — Telephone Encounter (Signed)
  History of low hemoglobin. Does she need an appt or can we order for her to have done at the lab?  Copied from CRM [id number]. Topic: General - Other >> Nov 14, 2023  3:38 PM Burnard DEL wrote: Reason for CRM: Patient behavior health doctor wanted her to have her hemoglobin checked because of her medications. Patient would like to know if order could be placed to come in and have this done?

## 2023-11-17 ENCOUNTER — Encounter (HOSPITAL_COMMUNITY): Payer: Self-pay | Admitting: Physician Assistant

## 2023-11-17 MED ORDER — LAMOTRIGINE 150 MG PO TABS: 150.0000 mg | ORAL_TABLET | Freq: Every day | ORAL | 2 refills | Status: DC

## 2023-11-17 MED ORDER — BUSPIRONE HCL 15 MG PO TABS
15.0000 mg | ORAL_TABLET | Freq: Two times a day (BID) | ORAL | 2 refills | Status: DC
Start: 2023-11-17 — End: 2023-12-03

## 2023-11-17 MED ORDER — OLANZAPINE 5 MG PO TABS: 5.0000 mg | ORAL_TABLET | Freq: Every day | ORAL | 2 refills | Status: DC

## 2023-11-17 NOTE — Progress Notes (Signed)
 [facility] MD/PA/NP OP Progress Note  Virtual Visit via Video Note  I connected with Patricia Mcmahon on 10/16/23 at  3:30 PM EDT by a video enabled telemedicine application and verified that I am speaking with the correct person using two identifiers.  Location: Patient: Home Provider: Clinic   I discussed the limitations of evaluation and management by telemedicine and the availability of in person appointments. The patient expressed understanding and agreed to proceed.  Follow Up Instructions:   I discussed the assessment and treatment plan with the patient. The patient was provided an opportunity to ask questions and all were answered. The patient agreed with the plan and demonstrated an understanding of the instructions.   The patient was advised to call back or seek an in-person evaluation if the symptoms worsen or if the condition fails to improve as anticipated.  I provided 12 minutes of non-face-to-face time during this encounter.  [name], PA    10/16/2023 3:30 PM Patricia Mcmahon  MRN:  [id number]  Chief Complaint:  Chief Complaint  Patient presents with   Follow-up   Medication Refill   HPI:   Patricia Mcmahon. [name] is a 38 year old female with a past psychiatric history significant for bipolar 1 disorder (mixed, moderate) and generalized anxiety disorder who presents to [facility] via virtual video visit for follow-up and medication management.  Patient is currently being managed on the following psychiatric medications:  Lamictal  150 mg at bedtime Buspirone  10 mg 2 times daily Olanzapine  5 mg at bedtime  Patient reports that she is currently taking her medications regularly and denies experiencing any adverse side effects associated with her use of her medications.  Patient denies overt depressive symptoms and states that her anxiety is under control.  Patient denies experiencing manic symptoms at this time.  Patient reports that she  most recently set herself up with primary care services and have an EKG performed not too long ago.  A PHQ-9 screen was performed with the patient scoring a 1.  A GAD-7 screen was also performed with the patient scoring a 9.  Patient is alert and oriented x 4, calm, cooperative, and fully engaged in conversation during the encounter.  Patient endorses happy mood.  Patient exhibits euthymic mood with appropriate affect.  Patient denies suicidal or homicidal ideations.  She further denies auditory or visual hallucinations and does not appear to be responding to internal/external stimuli.  Patient endorses fair sleep and receives on average 4 to 5 hours of sleep per night.  Patient endorses good appetite and eats on average 2-3 meals per day.  Patient endorses alcohol consumption on occasion.  Patient endorses tobacco use and smokes on hours 1/2 pack/day.  Patient denies illicit drug use.  Visit Diagnosis:    ICD-10-CM   1. Bipolar 1 disorder, mixed, moderate (HCC)  F31.62 OLANZapine  (ZYPREXA ) 5 MG tablet    lamoTRIgine  (LAMICTAL ) 150 MG tablet    2. 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  Head trauma: Denied Seizures: Denied DT: Denied Allergies: Morphine and codeine   Past Medical History:  Past Medical History:  Diagnosis Date   GERD (gastroesophageal reflux disease)    no current med.   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  Left knee pain 12/16/2012   Thrombocytopenia (HCC) 08/16/2011    Past Surgical History:  Procedure Laterality Date   CESAREAN SECTION     ESOPHAGOGASTRODUODENOSCOPY N/A 01/14/2021   Procedure: ESOPHAGOGASTRODUODENOSCOPY (EGD);  Surgeon: Burnette Fallow, MD;  Location: [facility];  Service: Endoscopy;  Laterality: N/A;   GASTRECTOMY     sleeve   KNEE ARTHROSCOPY  04/02/2012   Procedure: ARTHROSCOPY KNEE;  Surgeon: Maude KANDICE Herald, MD;  Location: [facility];  Service: Orthopedics;  Laterality: Left;  left knee arthroscopy chondroplasty   KNEE ARTHROSCOPY WITH LATERAL RELEASE Left 01/07/2013   Procedure: LEFT KNEE ARTHROSCOPY, CHONDROPLASTY AND  LATERAL RELEASE;  Surgeon: Maude KANDICE Herald, MD;  Location: [facility];  Service: Orthopedics;  Laterality: Left;    Family Psychiatric History:  Suicide: Paternal aunt completed suicide Homicide: Unsure Psych hospitalization: Denied BiPD: ?Dad SCZ/SCzA: ?Dad Others: Dad EtOH, learning disabilities  Family History:  Family History  Problem Relation Age of Onset   Alcohol abuse Mother    Cancer Mother    Learning disabilities Father    Alcohol abuse Father    Heart disease Maternal Grandmother    Diabetes Maternal Grandfather     Social History:  Social History   Socioeconomic History   Marital status: Single    Spouse name: Not on file   Number of children: Not on file   Years of education: Not on file   Highest education level: Not on file  Occupational History   Not on file  Tobacco Use   Smoking status: Every Day    Current packs/day: 1.00    Average packs/day: 1 pack/day for 16.0 years (16.0 ttl pk-yrs)    Types: Cigarettes   Smokeless tobacco: Never  Vaping Use   Vaping status: Never Used  Substance and Sexual Activity   Alcohol use: Yes    Comment: socially   Drug use: No   Sexual activity: Not on file  Other Topics Concern   Not on file  Social History Narrative   Not on file   Social Drivers of Health   Financial Resource Strain: Not on file  Food Insecurity: Food Insecurity Present (05/17/2023)   Hunger Vital Sign    Worried About Running Out of Food in the Last Year: Sometimes true    Ran Out of Food in the Last Year: Never true   Transportation Needs: No Transportation Needs (05/17/2023)   PRAPARE - Administrator, Civil Service (Medical): No    Lack of Transportation (Non-Medical): No  Physical Activity: Not on file  Stress: Not on file  Social Connections: Unknown (08/29/2021)   Received from [facility]   Social Network    Social Network: Not on file    Allergies:  Allergies  Allergen Reactions   Morphine Other (See Comments)    Behavior changes - becomes hostile/violent    Metabolic Disorder Labs: No results found for: HGBA1C, MPG No results found for: PROLACTIN Lab Results  Component Value Date   CHOL 114 07/16/2023   TRIG 88.0 07/16/2023   HDL 58.50 07/16/2023   CHOLHDL 2 07/16/2023   VLDL 17.6 07/16/2023   LDLCALC 38 07/16/2023   LDLCALC 45 04/19/2022   Lab Results  Component Value Date   TSH 0.47 07/16/2023   TSH 1.29 11/30/2022    Therapeutic Level Labs: No results found for: LITHIUM No results found for: VALPROATE No results found for: CBMZ  Current Medications: Current Outpatient Medications  Medication Sig  Dispense Refill   Azelastine HCl 137 MCG/SPRAY SOLN Place into both nostrils.     busPIRone  (BUSPAR ) 15 MG tablet Take 1 tablet (15 mg total) by mouth 2 (two) times daily. 60 tablet 2   cetirizine  (ZYRTEC ) 10 MG tablet Take 1 tablet (10 mg total) by mouth daily. 30 tablet 11   Cholecalciferol (VITAMIN D3) 25 MCG (1000 UT) CAPS Take 1 capsule (1,000 Units total) by mouth daily. 90 capsule 3   cyanocobalamin  (VITAMIN B12) 1000 MCG tablet Take 1 tablet (1,000 mcg total) by mouth daily. 90 tablet 1   ferrous sulfate  324 (65 Fe) MG TBEC Take 1 tablet (325 mg total) by mouth 2 (two) times daily. 60 tablet 0   fluticasone  (FLONASE ) 50 MCG/ACT nasal spray Place 2 sprays into both nostrils daily. 16 g 6   Ibuprofen  (ADVIL  PO) Take 2 capsules by mouth daily as needed. Gel Capsules     lamoTRIgine  (LAMICTAL ) 150 MG tablet Take 1 tablet (150 mg total) by mouth  at bedtime. 30 tablet 2   Multiple Vitamins-Minerals (MULTIVITAMIN WITH MINERALS) tablet Take 1 tablet by mouth daily. 90 tablet 1   OLANZapine  (ZYPREXA ) 5 MG tablet Take 1 tablet (5 mg total) by mouth at bedtime. 30 tablet 2   No current facility-administered medications for this visit.     Musculoskeletal: Strength & Muscle Tone: within normal limits Gait & Station: normal Patient leans: N/A  Psychiatric Specialty Exam: Review of Systems  Psychiatric/Behavioral:  Positive for sleep disturbance. Negative for decreased concentration, dysphoric mood, hallucinations, self-injury and suicidal ideas. The patient is not nervous/anxious and is not hyperactive.     There were no vitals taken for this visit.There is no height or weight on file to calculate BMI.  General Appearance: Casual  Eye Contact:  Good  Speech:  Clear and Coherent and Normal Rate  Volume:  Normal  Mood:  Euthymic  Affect:  Appropriate  Thought Process:  Coherent, Goal Directed, and Descriptions of Associations: Intact  Orientation:  Full (Time, Place, and Person)  Thought Content: WDL   Suicidal Thoughts:  No  Homicidal Thoughts:  No  Memory:  Immediate;   Good Recent;   Good Remote;   Good  Judgement:  Good  Insight:  Good  Psychomotor Activity:  Normal  Concentration:  Concentration: Good and Attention Span: Good  Recall:  Good  Fund of Knowledge: Good  Language: Good  Akathisia:  No  Handed:  Unknown  AIMS (if indicated): done; 0  Assets:  Communication Skills Desire for Improvement Financial Resources/Insurance Housing Intimacy Leisure Time Physical Health Resilience Social Support Talents/Skills Transportation Vocational/Educational  ADL's:  Intact  Cognition: WNL  Sleep:  Fair   Screenings: AIMS    Flowsheet Row Video Visit from 10/16/2023 in [facility]  AIMS Total Score 0   GAD-7    Flowsheet Row Video Visit from 10/16/2023 in [facility] Office Visit from 07/16/2023 in [facility] Video Visit from 05/29/2023 in [facility] Video Visit from 04/24/2023 in [facility] Video Visit from 03/07/2023 in [facility]  Total GAD-7 Score 9 10 12 18 13    PHQ2-9    Flowsheet Row Video Visit from 10/16/2023 in [facility] Office Visit from 07/16/2023 in [facility] at [facility] Video Visit from 05/29/2023 in [facility] Video Visit  from 04/24/2023 in [facility] Video Visit from 03/07/2023 in [facility]  PHQ-2 Total Score 1 2 2 6 5   PHQ-9 Total Score -- 10 8 21 16    Flowsheet Row Video Visit from 10/16/2023 in [facility] Video Visit from 05/29/2023 in [facility] ED to Hosp-Admission (Discharged) from 05/17/2023 in [facility]  C-SSRS RISK CATEGORY No Risk No Risk No Risk     Assessment and Plan:   Tinea Nobile. Hartmann is a 38 year old female with a past psychiatric history significant for bipolar 1 disorder (mixed, moderate) and generalized anxiety disorder who presents to [facility] via virtual video visit for follow-up and medication management.  Patient presents to the encounter stating that she continues to take her medications regularly and denies experiencing any adverse side effects.  An aims assessment was performed with the patient scoring a 0.  Patient denies overt depressive symptoms and states that her anxiety has been manageable through the use of her current medications.  A PHQ-9 screen was performed with the patient scoring a 1.  A GAD-7 screen was also performed with the patient scoring a 9.  Patient endorses stability on her current  medication regimen and would like to continue taking her medications as prescribed.  [name] medications to be e-prescribed to pharmacy of choice.  Due to her use of olanzapine , patient to have the following lab performed: Hemoglobin A1c.  Patient has a lipid profile, comprehensive metabolic panel, and complete blood count with differential up-to-date.  [name] EKG is up-to-date.  A [place] Suicide Severity Rating Scale was performed with the patient being considered no risk.   Collaboration of Care: Collaboration of Care: Medication Management AEB provider managing patient's psychiatric medications, Primary Care Provider AEB patient being seen by a family medicine provider, and Psychiatrist AEB patient being followed by mental health provider at this facility  Patient/Guardian was advised Release of Information must be obtained prior to any record release in order to collaborate their care with an outside provider. Patient/Guardian was advised if they have not already done so to contact the registration department to sign all necessary forms in order for us  to release information regarding their care.   Consent: Patient/Guardian gives verbal consent for treatment and assignment of benefits for services provided during this visit. Patient/Guardian expressed understanding and agreed to proceed.   1. Bipolar 1 disorder, mixed, moderate (HCC)  - OLANZapine  (ZYPREXA ) 5 MG tablet; Take 1 tablet (5 mg total) by mouth at bedtime.  Dispense: 30 tablet; Refill: 2 - lamoTRIgine  (LAMICTAL ) 150 MG tablet; Take 1 tablet (150 mg total) by mouth at bedtime.  Dispense: 30 tablet; Refill: 2  2. GAD (generalized anxiety disorder)  - busPIRone  (BUSPAR ) 15 MG tablet; Take 1 tablet (15 mg total) by mouth 2 (two) times daily.  Dispense: 60 tablet; Refill: 2   Patient to follow up in 2 months Provider spent a total of 12 minutes with the patient/reviewing patient's chart  [name], PA 10/16/2023, 3:30  PM

## 2023-11-20 DIAGNOSIS — L650 Telogen effluvium: Secondary | ICD-10-CM | POA: Diagnosis not present

## 2023-11-27 ENCOUNTER — Encounter (HOSPITAL_COMMUNITY): Payer: Self-pay | Admitting: Physician Assistant

## 2023-11-30 ENCOUNTER — Telehealth (INDEPENDENT_AMBULATORY_CARE_PROVIDER_SITE_OTHER): Admitting: Physician Assistant

## 2023-11-30 DIAGNOSIS — F411 Generalized anxiety disorder: Secondary | ICD-10-CM | POA: Diagnosis not present

## 2023-11-30 DIAGNOSIS — F3162 Bipolar disorder, current episode mixed, moderate: Secondary | ICD-10-CM

## 2023-12-03 MED ORDER — LAMOTRIGINE 150 MG PO TABS: 150.0000 mg | ORAL_TABLET | Freq: Every day | ORAL | 1 refills | Status: DC

## 2023-12-03 MED ORDER — BUSPIRONE HCL 15 MG PO TABS: 15.0000 mg | ORAL_TABLET | Freq: Two times a day (BID) | ORAL | 1 refills | Status: DC

## 2023-12-03 MED ORDER — OLANZAPINE 10 MG PO TABS: 5.0000 mg | ORAL_TABLET | Freq: Every day | ORAL | 1 refills | Status: DC

## 2023-12-03 NOTE — Progress Notes (Unsigned)
 BH MD/PA/NP OP Progress Note  Virtual Visit via Video Note  I connected with Patricia Mcmahon on 11/30/23 at  4:30 PM EDT by a video enabled telemedicine application and verified that I am speaking with the correct person using two identifiers.  Location: Patient: Home Provider: Clinic   I discussed the limitations of evaluation and management by telemedicine and the availability of in person appointments. The patient expressed understanding and agreed to proceed.  Follow Up Instructions:   I discussed the assessment and treatment plan with the patient. The patient was provided an opportunity to ask questions and all were answered. The patient agreed with the plan and demonstrated an understanding of the instructions.   The patient was advised to call back or seek an in-person evaluation if the symptoms worsen or if the condition fails to improve as anticipated.  I provided 22 minutes of non-face-to-face time during this encounter.  Patricia FORBES Bolster, PA    11/30/2023 4:30 PM Patricia Mcmahon  MRN:  [id number]  Chief Complaint:  Chief Complaint  Patient presents with   Follow-up   Medication Management   HPI:   Patricia Mcmahon is a 38 year old female with a past psychiatric history significant for bipolar 1 disorder (mixed, moderate) and generalized anxiety disorder who presents to [facility] via virtual video visit for follow-up and medication management.  Patient is currently being managed on the following psychiatric medications:  Lamictal  150 mg at bedtime Buspirone  10 mg 2 times daily Olanzapine  5 mg at bedtime  Patient presents to the encounter stating that she has been experiencing worsening depression characterized by irregular sleep.  Patient reports that her mood is all over the place and states that she often is either breaking down crying or extremely agitated.  Patient states that it is not uncommon for her to cry in her  sleep.  Patient endorses depression and rates her depression an 8-9 out of 10 with 10 being most severe.  Patient endorses depressive episodes every day.  Patient endorses the following depressive symptoms: feelings of sadness, crying spells, lack of motivation, decreased concentration, decreased energy, irritability, increased sleep, feelings of guilt/worthlessness, and hopelessness.  Patient reports that she is occasionally unaware of things that she is saying at the present moment or later.  Patient reports that her anxiety is through the roof and rates her anxiety a 7 out of 10.  Patient endorses the following stressors: current relationship, issues with her son, and stressors with her job.  She reports that everything is overwhelming to her and that she has no control of anything at this time.  A PHQ-9 screen was performed with the patient scoring a 24.  A GAD-7 screen was also performed with the patient scored an 18.  Patient is alert and oriented x 4, calm, cooperative, and fully engaged in conversation during the encounter.  Patient describes her mood as being really down and would rather be asleep.  Patient exhibits depressed mood with congruent affect.  Patient denies suicidal or homicidal ideations.  She further denies auditory or visual hallucinations and does not appear to be responding to internal/external stimuli.  Patient endorses increased sleep and receives on average 12 to 14 hours of sleep per night.  Patient endorses fair appetite and eats on average 1-2 meals per day.  Patient denies alcohol consumption or illicit drug use.  Patient endorses tobacco use and smokes on average half pack per day.  Visit Diagnosis:  ICD-10-CM   1. GAD (generalized anxiety disorder)  F41.1 busPIRone  (BUSPAR ) 15 MG tablet    2. 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  Head trauma: Denied Seizures: Denied DT: Denied Allergies: Morphine and codeine   Past Medical History:  Past Medical History:  Diagnosis Date   GERD (gastroesophageal reflux disease)    no current med.   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   Left knee pain 12/16/2012   Thrombocytopenia (HCC) 08/16/2011    Past Surgical History:  Procedure Laterality Date   CESAREAN SECTION     ESOPHAGOGASTRODUODENOSCOPY N/A 01/14/2021   Procedure: ESOPHAGOGASTRODUODENOSCOPY (EGD);  Surgeon: Burnette Fallow, MD;  Location: [facility];  Service: Endoscopy;  Laterality: N/A;   GASTRECTOMY     sleeve   KNEE ARTHROSCOPY  04/02/2012   Procedure: ARTHROSCOPY KNEE;  Surgeon: Maude KANDICE Herald, MD;  Location: [facility];  Service: Orthopedics;  Laterality: Left;  left knee arthroscopy chondroplasty   KNEE ARTHROSCOPY WITH LATERAL RELEASE Left 01/07/2013   Procedure: LEFT KNEE ARTHROSCOPY, CHONDROPLASTY AND  LATERAL RELEASE;  Surgeon: Maude KANDICE Herald, MD;  Location: [facility];  Service: Orthopedics;  Laterality: Left;    Family Psychiatric History:  Suicide: Paternal aunt completed suicide Homicide: Unsure Psych hospitalization: Denied BiPD: ?Dad SCZ/SCzA: ?Dad Others: Dad EtOH, learning disabilities  Family History:  Family History  Problem Relation Age of Onset   Alcohol abuse Mother    Cancer Mother    Learning disabilities Father    Alcohol abuse Father    Heart disease Maternal Grandmother    Diabetes Maternal Grandfather     Social History:  Social History   Socioeconomic History   Marital status: Single    Spouse name: Not on file   Number of children: Not on file   Years of education: Not  on file   Highest education level: Not on file  Occupational History   Not on file  Tobacco Use   Smoking status: Every Day    Current packs/day: 1.00    Average packs/day: 1 pack/day for 16.0 years (16.0 ttl pk-yrs)    Types: Cigarettes   Smokeless tobacco: Never  Vaping Use   Vaping status: Never Used  Substance and Sexual Activity   Alcohol use: Yes    Comment: socially   Drug use: No   Sexual activity: Not on file  Other Topics Concern   Not on file  Social History Narrative   Not on file   Social Drivers of Health   Financial Resource Strain: Not on file  Food Insecurity: Food Insecurity Present (05/17/2023)   Hunger Vital Sign    Worried About Running Out of Food in the Last Year: Sometimes true    Ran Out of Food in the Last Year: Never true  Transportation Needs: No Transportation Needs (05/17/2023)   PRAPARE - Administrator, Civil Service (Medical): No    Lack of Transportation (Non-Medical): No  Physical Activity: Not on file  Stress: Not on file  Social Connections: Unknown (08/29/2021)   Received from [facility]   Social Network    Social Network: Not  on file    Allergies:  Allergies  Allergen Reactions   Morphine Other (See Comments)    Behavior changes - becomes hostile/violent    Metabolic Disorder Labs: No results found for: HGBA1C, MPG No results found for: PROLACTIN Lab Results  Component Value Date   CHOL 114 07/16/2023   TRIG 88.0 07/16/2023   HDL 58.50 07/16/2023   CHOLHDL 2 07/16/2023   VLDL 17.6 07/16/2023   LDLCALC 38 07/16/2023   LDLCALC 45 04/19/2022   Lab Results  Component Value Date   TSH 0.47 07/16/2023   TSH 1.29 11/30/2022    Therapeutic Level Labs: No results found for: LITHIUM No results found for: VALPROATE No results found for: CBMZ  Current Medications: Current Outpatient Medications  Medication Sig Dispense Refill   Azelastine HCl 137 MCG/SPRAY SOLN Place into both nostrils.      busPIRone  (BUSPAR ) 15 MG tablet Take 1 tablet (15 mg total) by mouth 2 (two) times daily. 60 tablet 1   cetirizine  (ZYRTEC ) 10 MG tablet Take 1 tablet (10 mg total) by mouth daily. 30 tablet 11   Cholecalciferol (VITAMIN D3) 25 MCG (1000 UT) CAPS Take 1 capsule (1,000 Units total) by mouth daily. 90 capsule 3   cyanocobalamin  (VITAMIN B12) 1000 MCG tablet Take 1 tablet (1,000 mcg total) by mouth daily. 90 tablet 1   ferrous sulfate  324 (65 Fe) MG TBEC Take 1 tablet (325 mg total) by mouth 2 (two) times daily. 60 tablet 0   fluticasone  (FLONASE ) 50 MCG/ACT nasal spray Place 2 sprays into both nostrils daily. 16 g 6   Ibuprofen  (ADVIL  PO) Take 2 capsules by mouth daily as needed. Gel Capsules     lamoTRIgine  (LAMICTAL ) 150 MG tablet Take 1 tablet (150 mg total) by mouth at bedtime. 30 tablet 1   Multiple Vitamins-Minerals (MULTIVITAMIN WITH MINERALS) tablet Take 1 tablet by mouth daily. 90 tablet 1   OLANZapine  (ZYPREXA ) 10 MG tablet Take 1 tablet (10 mg total) by mouth at bedtime. 30 tablet 1   No current facility-administered medications for this visit.     Musculoskeletal: Strength & Muscle Tone: within normal limits Gait & Station: normal Patient leans: N/A  Psychiatric Specialty Exam: Review of Systems  Psychiatric/Behavioral:  Positive for sleep disturbance. Negative for decreased concentration, dysphoric mood, hallucinations, self-injury and suicidal ideas. The patient is not nervous/anxious and is not hyperactive.     There were no vitals taken for this visit.There is no height or weight on file to calculate BMI.  General Appearance: Casual  Eye Contact:  Good  Speech:  Clear and Coherent and Normal Rate  Volume:  Normal  Mood:  Euthymic  Affect:  Appropriate  Thought Process:  Coherent, Goal Directed, and Descriptions of Associations: Intact  Orientation:  Full (Time, Place, and Person)  Thought Content: WDL   Suicidal Thoughts:  No  Homicidal Thoughts:  No  Memory:   Immediate;   Good Recent;   Good Remote;   Good  Judgement:  Good  Insight:  Good  Psychomotor Activity:  Normal  Concentration:  Concentration: Good and Attention Span: Good  Recall:  Good  Fund of Knowledge: Good  Language: Good  Akathisia:  No  Handed:  Unknown  AIMS (if indicated): done; 0  Assets:  Communication Skills Desire for Improvement Financial Resources/Insurance Housing Intimacy Leisure Time Physical Health Resilience Social Support Talents/Skills Transportation Vocational/Educational  ADL's:  Intact  Cognition: WNL  Sleep:  Fair   Screenings: AIMS    Flowsheet  Row Video Visit from 11/30/2023 in [facility] Video Visit from 10/16/2023 in [facility]  AIMS Total Score 0 0   GAD-7    Flowsheet Row Video Visit from 11/30/2023 in [facility] Video Visit from 10/16/2023 in [facility] Office Visit from 07/16/2023 in [facility] Video Visit from 05/29/2023 in [facility] Video Visit from 04/24/2023 in [facility]  Total GAD-7 Score 18 9 10 12 18    PHQ2-9    Flowsheet Row Video Visit from 11/30/2023 in [facility] Video Visit from 10/16/2023 in [facility] Office Visit from 07/16/2023 in [facility] Video Visit from 05/29/2023 in [facility] Video Visit from 04/24/2023 in [facility]  PHQ-2 Total Score 6 1 2 2 6   PHQ-9 Total Score 24 -- 10 8 21    Flowsheet Row Video Visit from 11/30/2023 in [facility] Video Visit from 10/16/2023 in [facility] Video Visit from 05/29/2023 in [facility]  C-SSRS RISK CATEGORY No Risk No Risk  No Risk     Assessment and Plan:   Patricia Mcmahon is a 39 year old female with a past psychiatric history significant for bipolar 1 disorder (mixed, moderate) and generalized anxiety disorder who presents to [facility] via virtual video visit for follow-up and medication management.  Patient reports that she continues to take her medications regularly and denies experiencing any adverse side effects.  An aims assessment was performed with the patient scoring a 0.  Patient presents to the encounter endorsing severe depression.  She reports that she is often either breaking down crying or extremely agitated.  Patient also endorses elevated anxiety attributed to several stressors in her life.  A PHQ-9 screen was performed with the patient scoring a 24.  A GAD-7 screen was also performed with the patient scoring an 18.  Provider recommended increasing patient's olanzapine  dosage from 5 mg at bedtime to 10 mg at bedtime for mood stability.  Patient was agreeable to recommendation.  Patient to continue taking all other medications as prescribed.  [name] medications to be e-prescribed to pharmacy of choice.  A [place] Suicide Severity Rating Scale was performed with the patient being considered no risk.  Patient denies suicidal ideations and is able to contract for safety following the conclusion of the encounter.  Collaboration of Care: Collaboration of Care: Medication Management AEB provider managing patient's psychiatric medications, Primary Care Provider AEB patient being seen by a family medicine provider, and Psychiatrist AEB patient being followed by mental health provider at this facility  Patient/Guardian was advised Release of Information must be obtained prior to any record release in order to collaborate their care with an outside provider. Patient/Guardian was advised if they have not already done so to contact the registration department to sign all  necessary forms in order for us  to release information regarding their care.   Consent: Patient/Guardian gives verbal consent for treatment and assignment of benefits for services provided during this visit. Patient/Guardian expressed understanding and agreed to proceed.   1. GAD (generalized anxiety disorder)  - busPIRone  (BUSPAR ) 15 MG tablet; Take 1 tablet (15 mg total) by mouth 2 (two) times daily.  Dispense: 60 tablet; Refill: 1  2.  Bipolar 1 disorder, mixed, moderate (HCC)  - lamoTRIgine  (LAMICTAL ) 150 MG tablet; Take 1 tablet (150 mg total) by mouth at bedtime.  Dispense: 30 tablet; Refill: 1 - OLANZapine  (ZYPREXA ) 10 MG tablet; Take 1 tablet (10 mg total) by mouth at bedtime.  Dispense: 30 tablet; Refill: 1  Patient to follow up in 5 weeks Provider spent a total of 22 minutes with the patient/reviewing patient's chart  Patricia FORBES Bolster, PA 11/30/2023, 4:30 PM

## 2023-12-05 MED ORDER — OLANZAPINE 10 MG PO TABS: 10.0000 mg | ORAL_TABLET | Freq: Every day | ORAL | 1 refills | Status: DC

## 2023-12-06 ENCOUNTER — Other Ambulatory Visit (HOSPITAL_BASED_OUTPATIENT_CLINIC_OR_DEPARTMENT_OTHER): Payer: Self-pay | Admitting: Family Medicine

## 2023-12-06 DIAGNOSIS — Z1231 Encounter for screening mammogram for malignant neoplasm of breast: Secondary | ICD-10-CM

## 2023-12-08 ENCOUNTER — Encounter (HOSPITAL_COMMUNITY): Payer: Self-pay | Admitting: Physician Assistant

## 2023-12-10 ENCOUNTER — Other Ambulatory Visit (INDEPENDENT_AMBULATORY_CARE_PROVIDER_SITE_OTHER)

## 2023-12-10 ENCOUNTER — Ambulatory Visit (HOSPITAL_BASED_OUTPATIENT_CLINIC_OR_DEPARTMENT_OTHER)
Admission: RE | Admit: 2023-12-10 | Discharge: 2023-12-10 | Disposition: A | Discharge status: Home / Self Care | Source: Ambulatory Visit | Attending: Family Medicine | Admitting: Family Medicine

## 2023-12-10 ENCOUNTER — Encounter (HOSPITAL_BASED_OUTPATIENT_CLINIC_OR_DEPARTMENT_OTHER): Payer: Self-pay

## 2023-12-10 DIAGNOSIS — D649 Anemia, unspecified: Secondary | ICD-10-CM | POA: Diagnosis not present

## 2023-12-10 DIAGNOSIS — Z1231 Encounter for screening mammogram for malignant neoplasm of breast: Secondary | ICD-10-CM | POA: Diagnosis not present

## 2023-12-11 ENCOUNTER — Ambulatory Visit: Payer: Self-pay | Admitting: Family Medicine

## 2023-12-11 ENCOUNTER — Telehealth: Payer: Self-pay | Admitting: Family Medicine

## 2023-12-11 DIAGNOSIS — R5383 Other fatigue: Secondary | ICD-10-CM

## 2023-12-11 LAB — CBC WITH DIFFERENTIAL/PLATELET
Basophils Absolute: 0.1 K/uL (ref 0.0–0.1)
Basophils Relative: 1.6 % (ref 0.0–3.0)
Eosinophils Absolute: 0.3 K/uL (ref 0.0–0.7)
Eosinophils Relative: 5.3 % — ABNORMAL HIGH (ref 0.0–5.0)
HCT: 41.3 % (ref 36.0–46.0)
Hemoglobin: 13.8 g/dL (ref 12.0–15.0)
Lymphocytes Relative: 15.2 % (ref 12.0–46.0)
Lymphs Abs: 0.9 K/uL (ref 0.7–4.0)
MCHC: 33.4 g/dL (ref 30.0–36.0)
MCV: 95.9 fl (ref 78.0–100.0)
Monocytes Absolute: 0.4 K/uL (ref 0.1–1.0)
Monocytes Relative: 6.7 % (ref 3.0–12.0)
Neutro Abs: 4.4 K/uL (ref 1.4–7.7)
Neutrophils Relative %: 71.2 % (ref 43.0–77.0)
Platelets: 217 K/uL (ref 150.0–400.0)
RBC: 4.3 Mil/uL (ref 3.87–5.11)
RDW: 13 % (ref 11.5–15.5)
WBC: 6.2 K/uL (ref 4.0–10.5)

## 2023-12-11 NOTE — Telephone Encounter (Signed)
 Copied from CRM [id number]. Topic: Clinical - Medication Refill >> Dec 11, 2023  1:53 PM Leah C wrote: Medication: ferrous sulfate  324 (65 Fe) MG TBEC  Has the patient contacted their pharmacy? Patient went to Urgent care and her iron was low. Patient would like a new script of the iron pills.  (Agent: If no, request that the patient contact the pharmacy for the refill. If patient does not wish to contact the pharmacy document the reason why and proceed with request.) (Agent: If yes, when and what did the pharmacy advise?)  This is the patient's preferred pharmacy:  [facility] [address] [place], [place] - [address] [place] [address] Phone: [phone] Fax: [phone]  Is this the correct pharmacy for this prescription? Yes If no, delete pharmacy and type the correct one.   Has the prescription been filled recently? No  Is the patient out of the medication? Yes  Has the patient been seen for an appointment in the last year OR does the patient have an upcoming appointment? Yes  Can we respond through MyChart? Yes  Agent: Please be advised that Rx refills may take up to 3 business days. We ask that you follow-up with your pharmacy.

## 2023-12-11 NOTE — Telephone Encounter (Signed)
 Looks like CBC yesterday was okay and patient marked iron as not taking on 12/07/2023. Please advise.

## 2023-12-13 IMAGING — CT CT ABD-PELV W/ CM
2 of 4 series · 15 of 46 positions shown, 17 images · IV contrast (omnipaque)
Comparison: CT 01/11/2021

CLINICAL DATA: 35 y/o female. c/o pain to left side states that she
feels like she may have a UTI or bladder infection. Reports history
of same. Also reports some fevers and discomfort with urination.
Also has h/o SBO. 100 cc Omnipaque 300^100mL OMNIPAQUE IOHEXOL 300
MG/ML SOLNAbdominal pain, acute, nonlocalized left flank pain and
diffuse abdominal pain

EXAM:
CT ABDOMEN AND PELVIS WITH CONTRAST
TECHNIQUE: Multidetector CT imaging of the abdomen and pelvis was performed
using the standard protocol following bolus administration of
intravenous contrast.

[Series 2: axial st · axial · 0.90mm/px · z∈[+699,+1139]mm · 12 of 100 slices shown, 14 images]
[im 8/100  soft-tissue]
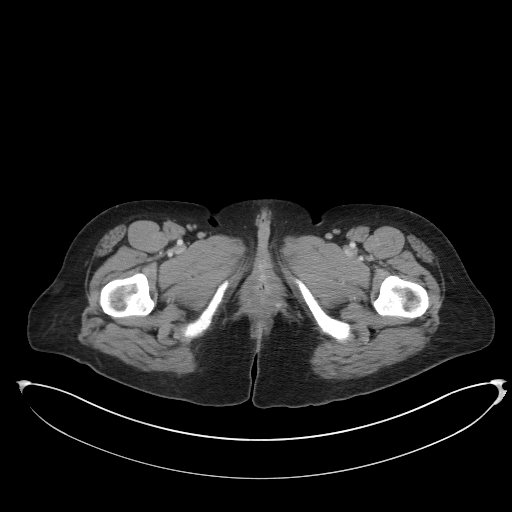
[im 8/100  bone]
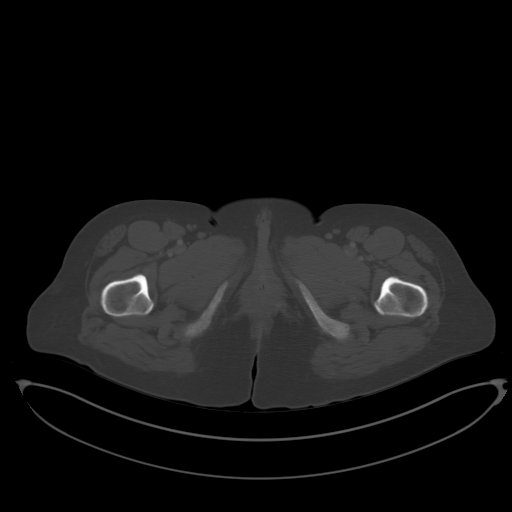
[im 16/100  soft-tissue]
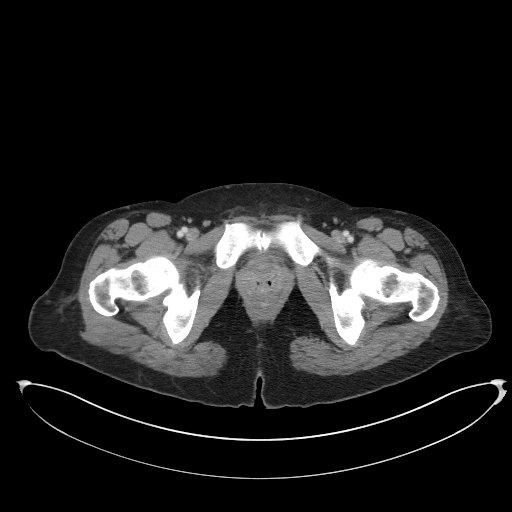
[im 24/100  soft-tissue]
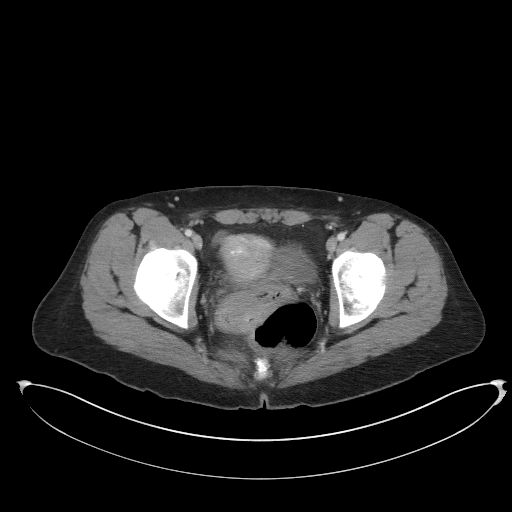
[im 32/100  soft-tissue]
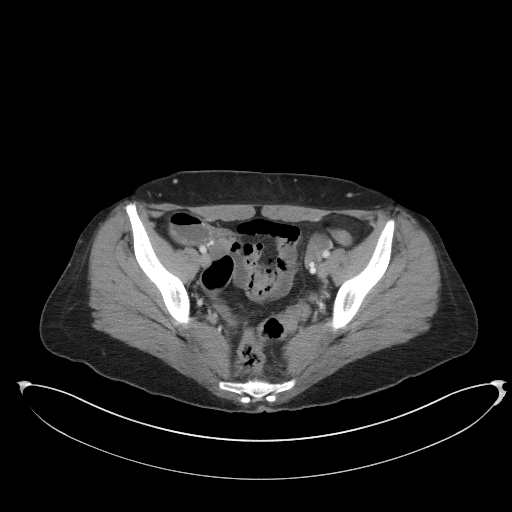
[im 40/100  soft-tissue]
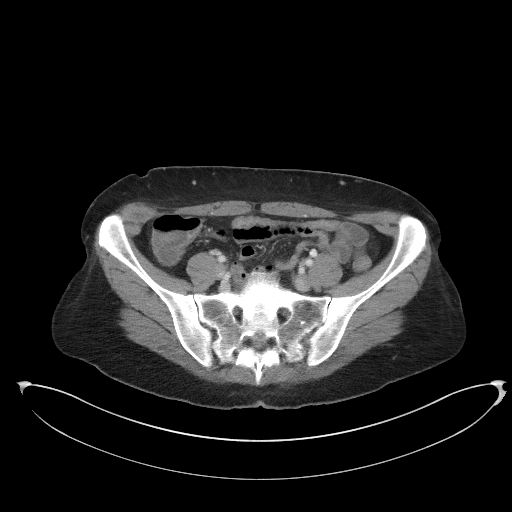
[im 48/100  soft-tissue]
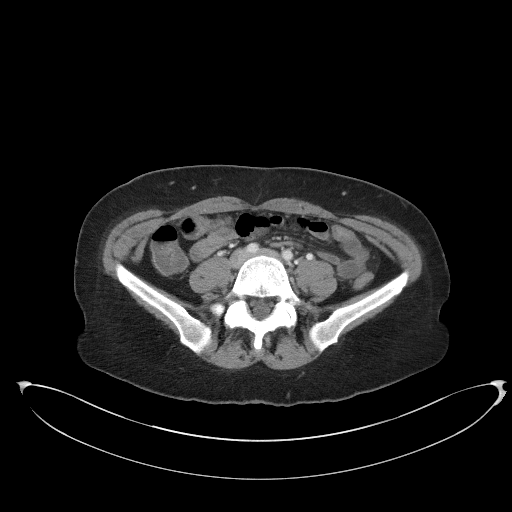
[im 56/100  soft-tissue]
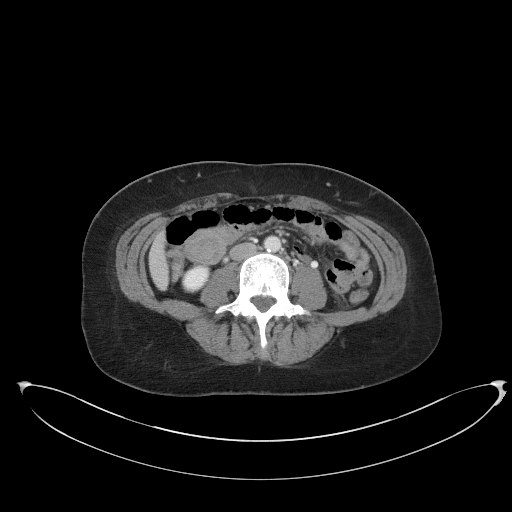
[im 64/100  soft-tissue]
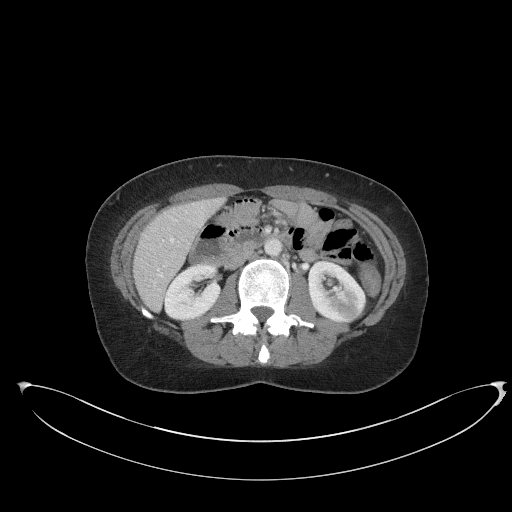
[im 72/100  soft-tissue]
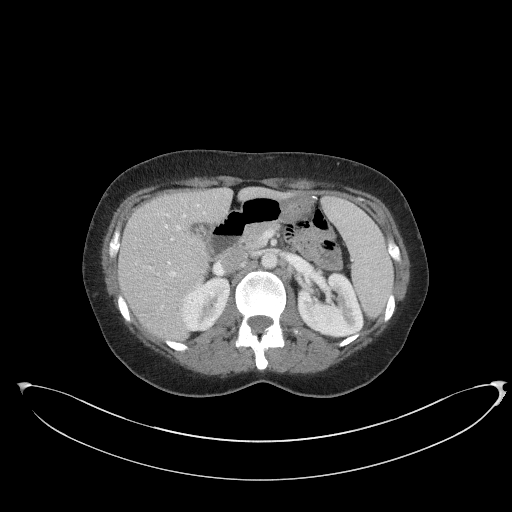
[im 72/100  bone]
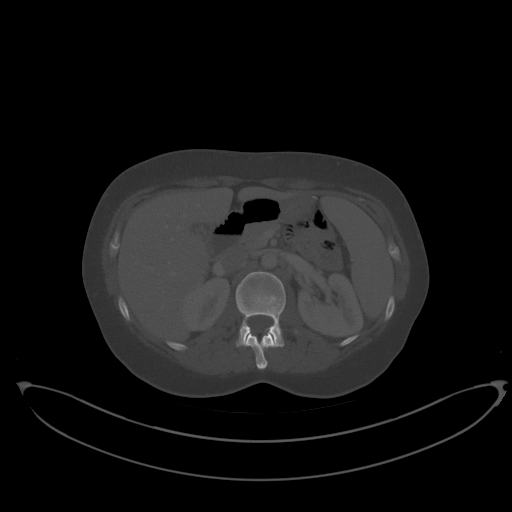
[im 80/100  soft-tissue]
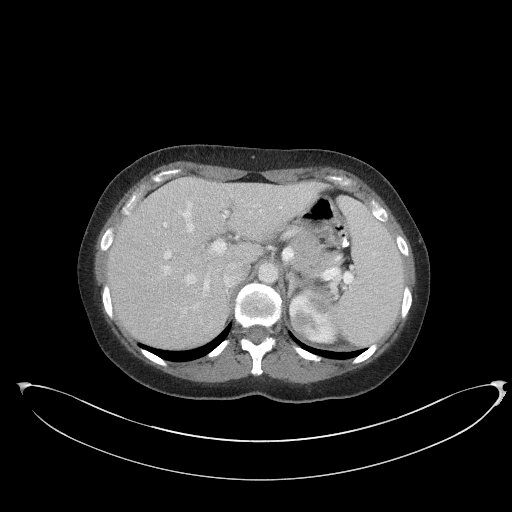
[im 88/100  soft-tissue]
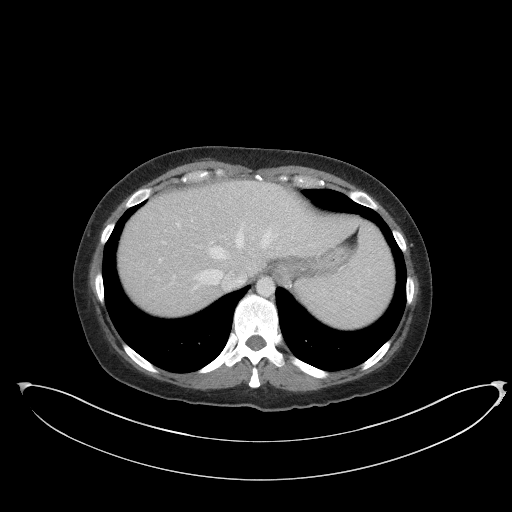
[im 96/100  soft-tissue]
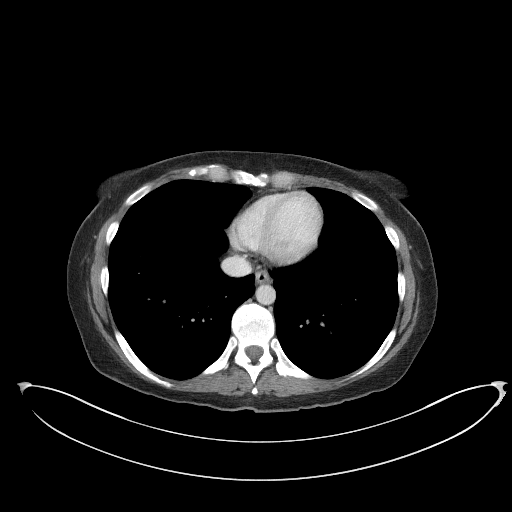

[Series 5: coronal st · coronal · 0.74mm/px · 3 of 101 slices shown]
[im 34/101  soft-tissue]
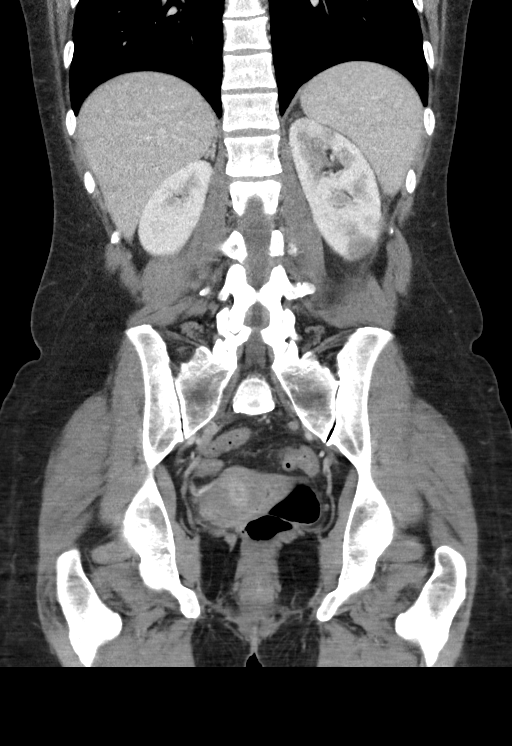
[im 45/101  soft-tissue]
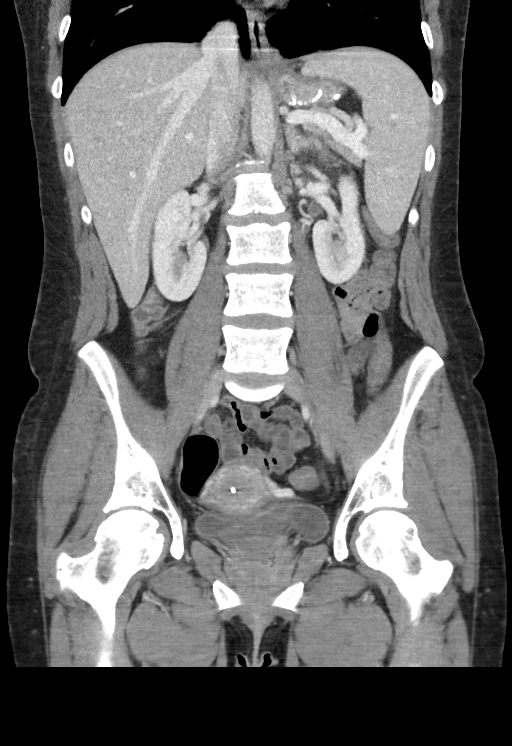
[im 56/101  soft-tissue]
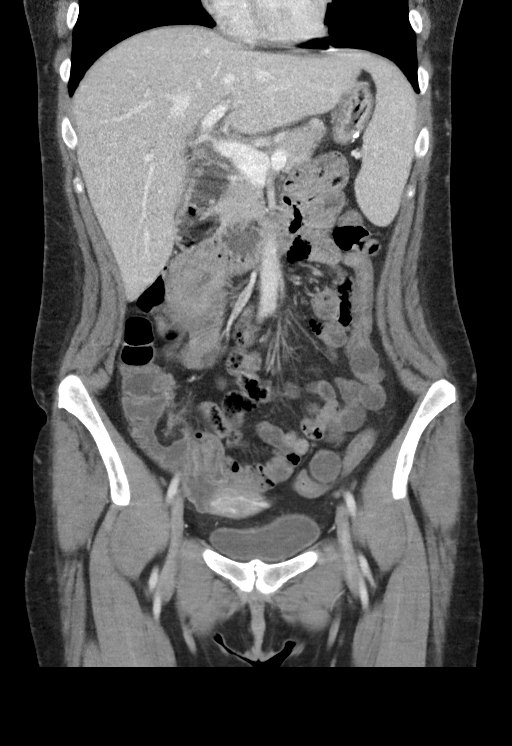

[15 of 46 positions shown; findings below may reference images not displayed]

RADIATION DOSE REDUCTION: This exam was performed according to the
departmental dose-optimization program which includes automated
exposure control, adjustment of the mA and/or kV according to
patient size and/or use of iterative reconstruction technique.

CONTRAST:  100mL OMNIPAQUE IOHEXOL 300 MG/ML  SOLN
FINDINGS: Lower chest: Lung bases are clear.

Hepatobiliary: No focal hepatic lesion. Gallstones within
nondistended gallbladder. No biliary duct dilatation. Common bile
duct is normal.

Pancreas: Pancreas is normal. No ductal dilatation. No pancreatic
inflammation.

Spleen: Normal spleen

Adrenals/urinary tract: Adrenal glands normal.

Several foci of hypoperfusion within the LEFT kidney involving the
upper pole and lower pole. Findings are most suggestive of
multifocal pyelonephritis. LEFT ureter and bladder are normal. RIGHT
kidney normal.

Stomach/Bowel: Post gastric bypass anatomy. Gastric sleeve anatomy.
Duodenum appears normal. There is a proximal small bowel - small
bowel intussusception within the RIGHT upper quadrant with the
intussusception measures approximately 7 cm (image 33/6).
Intussusception on comparison exam from 01/10/2021 within the LEFT
upper quadrant with similar length.

No small bowel obstruction. Distal small bowel normal. Terminal
ileum normal The colon and rectosigmoid colon are normal.

Vascular/Lymphatic: Abdominal aorta is normal caliber. No periportal
or retroperitoneal adenopathy. No pelvic adenopathy.

Reproductive: IUD expected location the uterus. No adnexal
abnormality.

Other: Trace amount of free fluid the pelvis.

Musculoskeletal: No aggressive osseous lesion.
IMPRESSION: 1. Multiple hypoenhancing foci within the LEFT renal cortex is most
consistent with MULTIFOCAL PYELONEPHRITIS.
2. Proximal small bowel intussusception in the central and RIGHT
upper quadrant. This is recurrent from a small bowel intussusception
in the LEFT upper quadrant on CT 01/10/2021 which resolved on 1 day
CT follow-up.
3. Post bariatric gastric sleeve anatomy

## 2023-12-13 MED ORDER — FERROUS SULFATE 324 (65 FE) MG PO TBEC: 1.0000 | DELAYED_RELEASE_TABLET | Freq: Two times a day (BID) | ORAL | 0 refills | Status: DC

## 2023-12-13 NOTE — Telephone Encounter (Signed)
Tried to call patient, no answer. Mychart message sent to patient.

## 2023-12-14 ENCOUNTER — Other Ambulatory Visit: Payer: Self-pay | Admitting: Family Medicine

## 2023-12-14 DIAGNOSIS — R928 Other abnormal and inconclusive findings on diagnostic imaging of breast: Secondary | ICD-10-CM

## 2023-12-19 ENCOUNTER — Telehealth (HOSPITAL_COMMUNITY): Admitting: Physician Assistant

## 2023-12-26 ENCOUNTER — Ambulatory Visit
Admission: RE | Admit: 2023-12-26 | Discharge: 2023-12-26 | Disposition: A | Discharge status: Home / Self Care | Source: Ambulatory Visit | Attending: Family Medicine | Admitting: Family Medicine

## 2023-12-26 ENCOUNTER — Ambulatory Visit

## 2023-12-26 DIAGNOSIS — R928 Other abnormal and inconclusive findings on diagnostic imaging of breast: Secondary | ICD-10-CM

## 2024-01-04 ENCOUNTER — Telehealth (HOSPITAL_COMMUNITY): Admitting: Physician Assistant

## 2024-01-04 DIAGNOSIS — F3132 Bipolar disorder, current episode depressed, moderate: Secondary | ICD-10-CM | POA: Diagnosis not present

## 2024-01-04 DIAGNOSIS — F411 Generalized anxiety disorder: Secondary | ICD-10-CM

## 2024-01-04 MED ORDER — BUSPIRONE HCL 15 MG PO TABS: 15.0000 mg | ORAL_TABLET | Freq: Two times a day (BID) | ORAL | 1 refills | Status: DC

## 2024-01-04 MED ORDER — LAMOTRIGINE 150 MG PO TABS: 150.0000 mg | ORAL_TABLET | Freq: Every day | ORAL | 1 refills | Status: DC

## 2024-01-04 MED ORDER — QUETIAPINE FUMARATE 50 MG PO TABS: 50.0000 mg | ORAL_TABLET | Freq: Every day | ORAL | 1 refills | Status: DC

## 2024-01-04 MED ORDER — OLANZAPINE 2.5 MG PO TABS: ORAL_TABLET | ORAL | 0 refills | Status: DC

## 2024-01-04 NOTE — Progress Notes (Signed)
 BH MD/PA/NP OP Progress Note  Virtual Visit via Video Note  I connected with Patricia Mcmahon on 01/04/24 at  4:00 PM EDT by a video enabled telemedicine application and verified that I am speaking with the correct person using two identifiers.  Location: Patient: Home Provider: Clinic   I discussed the limitations of evaluation and management by telemedicine and the availability of in person appointments. The patient expressed understanding and agreed to proceed.  Follow Up Instructions:   I discussed the assessment and treatment plan with the patient. The patient was provided an opportunity to ask questions and all were answered. The patient agreed with the plan and demonstrated an understanding of the instructions.   The patient was advised to call back or seek an in-person evaluation if the symptoms worsen or if the condition fails to improve as anticipated.  I provided 18 minutes of non-face-to-face time during this encounter.  Patricia FORBES Bolster, PA    01/04/2024 4:00 PM TELLY BROBERG  MRN:  [id number]  Chief Complaint:  Chief Complaint  Patient presents with   Follow-up   Medication Management   HPI:   Patricia Mcmahon. Men is a 38 year old female with a past psychiatric history significant for bipolar 1 disorder (mixed, moderate) and generalized anxiety disorder who presents to [facility] via virtual video visit for follow-up and medication management.  Patient is currently being managed on the following psychiatric medications:  Lamictal  150 mg at bedtime Buspirone  10 mg 2 times daily Olanzapine  10 mg at bedtime  Patient reports that she has not noticed any improvements in her mood due to the use of her current medication regimen.  During her last encounter, patient's olanzapine  was adjusted from 5 mg to 10 mg at bedtime for the management of her mood.  Patient reports that she has been more emotional lately and has been experiencing  crying spells.  Patient rates her depression an 8 out of 10 with 10 being most severe.  Patient endorses depressive episodes every day with symptoms lasting the whole day.  Patient endorses the following depressive symptoms: feelings of sadness, crying spells, lack of motivation, decreased concentration, decreased energy, irritability, feelings of guilt/worthlessness, and hopelessness.  Patient also endorses anxiety and rates her anxiety an 8 out of 10.  Patient denies recent episodes of mania and further denies any new stressors at this time. A PHQ-9 screen was performed with the patient scoring a 24.  A GAD-7 screen was also performed with the patient scoring a 20.  Patient is alert and oriented x 4, calm, cooperative, and fully engaged in conversation during the encounter.  Patient endorses depressed mood.  Patient exhibits depressed mood with congruent affect.  Patient denies suicidal or homicidal ideations.  She further denies auditory or visual hallucinations and does not appear to be responding to internal/external stimuli.  Patient endorses fair sleep and receives on average 12 to 16 hours of sleep per day.  Patient endorses poor appetite and eats on average 1 meal per day.  Patient denies alcohol consumption or illicit drug use.  Patient endorses tobacco use and smokes on average a half pack per day.  Visit Diagnosis:    ICD-10-CM   1. GAD (generalized anxiety disorder)  F41.1 busPIRone  (BUSPAR ) 15 MG tablet    2. 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  Head trauma: Denied Seizures: Denied DT:  Denied Allergies: Morphine and codeine   Past Medical History:  Past Medical History:  Diagnosis Date   GERD (gastroesophageal reflux disease)    no current med.   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   Left knee pain 12/16/2012   Thrombocytopenia (HCC) 08/16/2011    Past Surgical History:  Procedure Laterality Date   CESAREAN SECTION     ESOPHAGOGASTRODUODENOSCOPY N/A 01/14/2021   Procedure: ESOPHAGOGASTRODUODENOSCOPY (EGD);  Surgeon: Burnette Fallow, MD;  Location: [facility];  Service: Endoscopy;  Laterality: N/A;   GASTRECTOMY     sleeve   KNEE ARTHROSCOPY  04/02/2012   Procedure: ARTHROSCOPY KNEE;  Surgeon: Maude KANDICE Herald, MD;  Location: [facility];  Service: Orthopedics;  Laterality: Left;  left knee arthroscopy chondroplasty   KNEE ARTHROSCOPY WITH LATERAL RELEASE Left 01/07/2013   Procedure: LEFT KNEE ARTHROSCOPY, CHONDROPLASTY AND  LATERAL RELEASE;  Surgeon: Maude KANDICE Herald, MD;  Location: [facility];  Service: Orthopedics;  Laterality: Left;    Family Psychiatric History:  Suicide: Paternal aunt completed suicide Homicide: Unsure Psych hospitalization: Denied BiPD: ?Dad SCZ/SCzA: ?Dad Others: Dad EtOH, learning disabilities  Family History:  Family History  Problem Relation Age of Onset   Alcohol abuse Mother    Cancer Mother    Learning disabilities Father    Alcohol abuse Father    Heart disease Maternal Grandmother    Diabetes Maternal Grandfather     Social History:  Social History   Socioeconomic History   Marital status: Single    Spouse name: Not on file   Number of children: Not on file   Years of education: Not on file   Highest education level: Not on file  Occupational History   Not on file  Tobacco Use   Smoking status: Every Day    Current packs/day: 1.00    Average packs/day: 1 pack/day for 16.0 years (16.0 ttl pk-yrs)    Types: Cigarettes   Smokeless tobacco: Never  Vaping Use   Vaping status:  Never Used  Substance and Sexual Activity   Alcohol use: Yes    Comment: socially   Drug use: No   Sexual activity: Not on file  Other Topics Concern   Not on file  Social History Narrative   Not on file   Social Drivers of Health   Financial Resource Strain: Not on file  Food Insecurity: Food Insecurity Present (05/17/2023)   Hunger Vital Sign    Worried About Running Out of Food in the Last Year: Sometimes true    Ran Out of Food in the Last Year: Never true  Transportation Needs: No Transportation Needs (05/17/2023)   PRAPARE - Administrator, Civil Service (Medical): No    Lack of Transportation (Non-Medical): No  Physical Activity: Not on file  Stress: Not on file  Social Connections: Unknown (08/29/2021)   Received from [facility]   Social Network    Social Network: Not on file    Allergies:  Allergies  Allergen Reactions   Morphine Other (See Comments)    Behavior changes - becomes hostile/violent    Metabolic Disorder Labs: No results found for: HGBA1C, MPG No results found for: PROLACTIN Lab Results  Component Value Date   CHOL 114 07/16/2023  TRIG 88.0 07/16/2023   HDL 58.50 07/16/2023   CHOLHDL 2 07/16/2023   VLDL 17.6 07/16/2023   LDLCALC 38 07/16/2023   LDLCALC 45 04/19/2022   Lab Results  Component Value Date   TSH 0.47 07/16/2023   TSH 1.29 11/30/2022    Therapeutic Level Labs: No results found for: LITHIUM No results found for: VALPROATE No results found for: CBMZ  Current Medications: Current Outpatient Medications  Medication Sig Dispense Refill   QUEtiapine  (SEROQUEL ) 50 MG tablet Take 1 tablet (50 mg total) by mouth at bedtime. 30 tablet 1   Azelastine HCl 137 MCG/SPRAY SOLN Place into both nostrils.     busPIRone  (BUSPAR ) 15 MG tablet Take 1 tablet (15 mg total) by mouth 2 (two) times daily. 60 tablet 1   cetirizine  (ZYRTEC ) 10 MG tablet Take 1 tablet (10 mg total) by mouth daily. 30 tablet 11    Cholecalciferol (VITAMIN D3) 25 MCG (1000 UT) CAPS Take 1 capsule (1,000 Units total) by mouth daily. 90 capsule 3   cyanocobalamin  (VITAMIN B12) 1000 MCG tablet Take 1 tablet (1,000 mcg total) by mouth daily. 90 tablet 1   ferrous sulfate  324 (65 Fe) MG TBEC Take 1 tablet (325 mg total) by mouth 2 (two) times daily. 60 tablet 0   fluticasone  (FLONASE ) 50 MCG/ACT nasal spray Place 2 sprays into both nostrils daily. 16 g 6   Ibuprofen  (ADVIL  PO) Take 2 capsules by mouth daily as needed. Gel Capsules     lamoTRIgine  (LAMICTAL ) 150 MG tablet Take 1 tablet (150 mg total) by mouth at bedtime. 30 tablet 1   Multiple Vitamins-Minerals (MULTIVITAMIN WITH MINERALS) tablet Take 1 tablet by mouth daily. 90 tablet 1   OLANZapine  (ZYPREXA ) 2.5 MG tablet Patient to take olanzapine  2.5 mg at bedtime for 7 days before discontinuing. 7 tablet 0   No current facility-administered medications for this visit.     Musculoskeletal: Strength & Muscle Tone: within normal limits Gait & Station: normal Patient leans: N/A  Psychiatric Specialty Exam: Review of Systems  Psychiatric/Behavioral:  Positive for sleep disturbance. Negative for decreased concentration, dysphoric mood, hallucinations, self-injury and suicidal ideas. The patient is not nervous/anxious and is not hyperactive.     There were no vitals taken for this visit.There is no height or weight on file to calculate BMI.  General Appearance: Casual  Eye Contact:  Good  Speech:  Clear and Coherent and Normal Rate  Volume:  Normal  Mood:  Euthymic  Affect:  Appropriate  Thought Process:  Coherent, Goal Directed, and Descriptions of Associations: Intact  Orientation:  Full (Time, Place, and Person)  Thought Content: WDL   Suicidal Thoughts:  No  Homicidal Thoughts:  No  Memory:  Immediate;   Good Recent;   Good Remote;   Good  Judgement:  Good  Insight:  Good  Psychomotor Activity:  Normal  Concentration:  Concentration: Good and Attention Span:  Good  Recall:  Good  Fund of Knowledge: Good  Language: Good  Akathisia:  No  Handed:  Unknown  AIMS (if indicated): done; 0  Assets:  Communication Skills Desire for Improvement Financial Resources/Insurance Housing Intimacy Leisure Time Physical Health Resilience Social Support Talents/Skills Transportation Vocational/Educational  ADL's:  Intact  Cognition: WNL  Sleep:  Fair   Screenings: AIMS    Flowsheet Row Video Visit from 01/04/2024 in [facility] Video Visit from 11/30/2023 in [facility] Video Visit from 10/16/2023 in [facility]  AIMS Total Score 0 0 0   GAD-7    Flowsheet Row Video Visit from 01/04/2024 in [facility] Video Visit from 11/30/2023 in [facility] Video Visit from 10/16/2023 in [facility] Office Visit from 07/16/2023 in [facility] Video Visit from 05/29/2023 in [facility]  Total GAD-7 Score 20 18 9 10 12    PHQ2-9    Flowsheet Row Video Visit from 01/04/2024 in [facility] Video Visit from 11/30/2023 in [facility] Video Visit from 10/16/2023 in [facility] Office Visit from 07/16/2023 in [facility] Primary Care at [facility] Video Visit from 05/29/2023 in [facility]  PHQ-2 Total Score 6 6 1 2 2   PHQ-9 Total Score 24 24 -- 10 8   Flowsheet Row Video Visit from 01/04/2024 in [facility] Video Visit from 11/30/2023 in [facility] Video Visit from 10/16/2023 in [facility]  C-SSRS RISK CATEGORY No Risk No Risk No Risk     Assessment and Plan:   Patricia Mcmahon is a 38 year old female with a past  psychiatric history significant for bipolar 1 disorder (mixed, moderate) and generalized anxiety disorder who presents to [facility] via virtual video visit for follow-up and medication management.  Patient presents to the encounter stating that she continues to take her medications regularly and denies experiencing any adverse side effects at this time.  An aims assessment was performed with the patient scoring a 0.  Since adjusting her medications the last encounter, patient reports that she has not noticed any improvements in her mood or anxiety.  Patient continues to experience worsening depression and elevated anxiety.  A PHQ-9 screen was performed with the patient scoring a 24.  A GAD-7 screen was also performed with the patient scoring a 20.  Since her medications appear to be ineffective in managing her symptoms, provider recommended patient discontinue her use of the medication.  Provider instructed patient to take olanzapine  5 mg at bedtime for 7 days, followed by olanzapine  2.5 mg at bedtime for 7 more days before discontinuing the medication.  Patient was instructed to start Seroquel  50 mg at bedtime for mood stability in the management of her depressive symptoms.  Provider also recommended increasing patient's buspirone  dosage from 10 mg to 15 mg 2 times daily for the management of her anxiety.  Patient was agreeable to recommendations.  Provider discussed side effects associated with the use of Seroquel .  Patient verbalized understanding.  [name] medications to be e-prescribed to pharmacy of choice.  A [place] Suicide Severity Rating Scale was performed with the patient being considered no risk.  Patient denies suicidal ideations and is able to contract for safety at this time.   Collaboration of Care: Collaboration of Care: Medication Management AEB provider managing patient's psychiatric medications, Primary Care Provider AEB patient being seen by  a family medicine provider, and Psychiatrist AEB patient being followed by mental health provider at this facility  Patient/Guardian was advised Release of Information must be obtained prior to any record release in order to collaborate their care with an outside provider. Patient/Guardian was advised if they have not already done so to contact the registration department to sign all necessary forms in order for us  to release information regarding their care.  Consent: Patient/Guardian gives verbal consent for treatment and assignment of benefits for services provided during this visit. Patient/Guardian expressed understanding and agreed to proceed.   1. GAD (generalized anxiety disorder) (Primary)  - busPIRone  (BUSPAR ) 15 MG tablet; Take 1 tablet (15 mg total) by mouth 2 (two) times daily.  Dispense: 60 tablet; Refill: 1  2. Bipolar affective disorder, currently depressed, moderate (HCC)  - OLANZapine  (ZYPREXA ) 2.5 MG tablet; Patient to take olanzapine  2.5 mg at bedtime for 7 days before discontinuing.  Dispense: 7 tablet; Refill: 0 - lamoTRIgine  (LAMICTAL ) 150 MG tablet; Take 1 tablet (150 mg total) by mouth at bedtime.  Dispense: 30 tablet; Refill: 1 - QUEtiapine  (SEROQUEL ) 50 MG tablet; Take 1 tablet (50 mg total) by mouth at bedtime.  Dispense: 30 tablet; Refill: 1  Patient to follow up in 6 weeks Provider spent a total of 18 minutes with the patient/reviewing patient's chart  Patricia FORBES Bolster, PA 01/04/2024, 4:00 PM

## 2024-01-09 ENCOUNTER — Telehealth (HOSPITAL_COMMUNITY): Payer: Self-pay | Admitting: Physician Assistant

## 2024-01-12 ENCOUNTER — Encounter (HOSPITAL_COMMUNITY): Payer: Self-pay | Admitting: Physician Assistant

## 2024-01-16 ENCOUNTER — Encounter (HOSPITAL_COMMUNITY): Payer: Self-pay | Admitting: Physician Assistant

## 2024-01-25 ENCOUNTER — Encounter (HOSPITAL_COMMUNITY): Payer: Self-pay | Admitting: Physician Assistant

## 2024-01-27 ENCOUNTER — Other Ambulatory Visit: Payer: Self-pay | Admitting: Medical Genetics

## 2024-01-27 DIAGNOSIS — Z006 Encounter for examination for normal comparison and control in clinical research program: Secondary | ICD-10-CM

## 2024-02-04 ENCOUNTER — Telehealth: Payer: Self-pay

## 2024-02-04 DIAGNOSIS — Z7189 Other specified counseling: Secondary | ICD-10-CM

## 2024-02-04 NOTE — Progress Notes (Signed)
 Complex Care Management Note Care Guide Note  02/04/2024 Name: Patricia Mcmahon MRN: [id number] DOB: May 24, 1985   Complex Care Management Outreach Attempts: An unsuccessful telephone outreach was attempted today to offer the patient information about available complex care management services.  Follow Up Plan:  Additional outreach attempts will be made to offer the patient complex care management information and services.   Encounter Outcome:  No Answer  Jeoffrey Buffalo , RMA     [place]  [facility], [facility] Guide  Direct Dial: [phone]  Website: [email or web address]

## 2024-02-13 ENCOUNTER — Telehealth (HOSPITAL_COMMUNITY): Payer: Self-pay | Admitting: Physician Assistant

## 2024-02-13 NOTE — Telephone Encounter (Signed)
 Patient called and said that she needs the form she left filled out by the end of this week and wanted to remind you. Please advise. Thank you.

## 2024-02-13 NOTE — Progress Notes (Signed)
 Complex Care Management Note Care Guide Note  02/13/2024 Name: Patricia Mcmahon MRN: [id number] DOB: 09-10-1985   Complex Care Management Outreach Attempts: A second unsuccessful outreach was attempted today to offer the patient with information about available complex care management services.  Follow Up Plan:  Additional outreach attempts will be made to offer the patient complex care management information and services.   Encounter Outcome:  No Answer  Jeoffrey Buffalo , RMA     [place]  [facility], [facility] Guide  Direct Dial: [phone]  Website: [email or web address]

## 2024-02-26 NOTE — Progress Notes (Signed)
 Complex Care Management Note  Care Guide Note 02/26/2024 Name: Patricia Mcmahon MRN: [id number] DOB: 1985/09/01  Patricia Mcmahon is a 38 y.o. year old female who sees Almarie Waddell NOVAK, NP for primary care. I reached out to Patricia Mcmahon by phone today to offer complex care management services.  Ms. Ellwanger was given information about Complex Care Management services today including:   The Complex Care Management services include support from the care team which includes your Nurse Care Manager, Clinical Social Worker, or Pharmacist.  The Complex Care Management team is here to help remove barriers to the health concerns and goals most important to you. Complex Care Management services are voluntary, and the patient may decline or stop services at any time by request to their care team member.   Complex Care Management Consent Status: Patient did not agree to participate in complex care management services at this time.  Follow up plan:    Encounter Outcome:  Patient Refused  Jeoffrey Buffalo , RMA     [facility]  [facility], [facility] Guide  Direct Dial: [phone]  Website: [email or web address]

## 2024-02-29 ENCOUNTER — Telehealth (INDEPENDENT_AMBULATORY_CARE_PROVIDER_SITE_OTHER): Admitting: Physician Assistant

## 2024-02-29 DIAGNOSIS — F3132 Bipolar disorder, current episode depressed, moderate: Secondary | ICD-10-CM | POA: Diagnosis not present

## 2024-02-29 DIAGNOSIS — F411 Generalized anxiety disorder: Secondary | ICD-10-CM

## 2024-03-04 MED ORDER — BUSPIRONE HCL 15 MG PO TABS: 15.0000 mg | ORAL_TABLET | Freq: Two times a day (BID) | ORAL | 1 refills | Status: AC

## 2024-03-04 MED ORDER — LAMOTRIGINE 150 MG PO TABS
150.0000 mg | ORAL_TABLET | Freq: Every day | ORAL | 1 refills | Status: AC
Start: 2024-03-04 — End: 2025-03-04

## 2024-03-04 MED ORDER — QUETIAPINE FUMARATE 100 MG PO TABS: 100.0000 mg | ORAL_TABLET | Freq: Every day | ORAL | 1 refills | Status: AC

## 2024-03-21 ENCOUNTER — Ambulatory Visit: Payer: Self-pay

## 2024-03-21 ENCOUNTER — Ambulatory Visit: Admitting: Medical

## 2024-03-21 ENCOUNTER — Other Ambulatory Visit (HOSPITAL_BASED_OUTPATIENT_CLINIC_OR_DEPARTMENT_OTHER): Payer: Self-pay

## 2024-03-21 VITALS — BP 120/88 | HR 79 | Temp 97.7°F | Resp 17 | Ht 68.0 in | Wt 193.6 lb

## 2024-03-21 DIAGNOSIS — R350 Frequency of micturition: Secondary | ICD-10-CM | POA: Diagnosis not present

## 2024-03-21 DIAGNOSIS — R39859 Costovertebral (angle) tenderness, unspecified side: Secondary | ICD-10-CM

## 2024-03-21 DIAGNOSIS — Z87440 Personal history of urinary (tract) infections: Secondary | ICD-10-CM

## 2024-03-21 DIAGNOSIS — R3989 Other symptoms and signs involving the genitourinary system: Secondary | ICD-10-CM | POA: Diagnosis not present

## 2024-03-21 DIAGNOSIS — R5383 Other fatigue: Secondary | ICD-10-CM

## 2024-03-21 DIAGNOSIS — R6883 Chills (without fever): Secondary | ICD-10-CM | POA: Diagnosis not present

## 2024-03-21 DIAGNOSIS — L089 Local infection of the skin and subcutaneous tissue, unspecified: Secondary | ICD-10-CM | POA: Diagnosis not present

## 2024-03-21 LAB — POCT URINALYSIS DIPSTICK
Bilirubin, UA: NEGATIVE
Blood, UA: NEGATIVE
Glucose, UA: NEGATIVE
Ketones, UA: NEGATIVE
Leukocytes, UA: NEGATIVE
Nitrite, UA: NEGATIVE
Protein, UA: NEGATIVE
Spec Grav, UA: <=1.005 — AB (ref 1.010–1.025)
Urobilinogen, UA: 0.2 U/dL
pH, UA: 7 (ref 5.0–8.0)

## 2024-03-21 MED ORDER — SULFAMETHOXAZOLE-TRIMETHOPRIM 800-160 MG PO TABS
1.0000 | ORAL_TABLET | Freq: Two times a day (BID) | ORAL | 0 refills | Status: DC
  Filled 2024-03-21: qty 20, 10d supply, fill #0

## 2024-03-21 MED ORDER — CEFTRIAXONE SODIUM 1 G IJ SOLR
1.0000 g | Freq: Once | INTRAMUSCULAR | Status: AC
  Administered 2024-03-21: 1 g via INTRAMUSCULAR

## 2024-03-21 NOTE — Patient Instructions (Signed)
 Urinary tract infection possible by history and recent symptoms.  Symptoms and history suggest UTI with possible kidney involvement. Rocephin  chosen for broad coverage, Bactrim  DS rx as well pending culture. - Administered Rocephin  1 gram IM. - Prescribed Bactrim  DS. - Ordered urine culture for confirmation and sensitivity. - Advised hydration and Azo Standard for symptom relief. - Will update on culture results and adjust antibiotics if necessary. -recommend cbc and cmp based on fatigue and chills reported as well as cva pain. Pt declined labs.  Intranasal skin infection with some mild sinus tenderness and teeth pain Symptoms suggest possible MRSA nasal infection. Bactrim  DS chosen for MRSA coverage. - Prescribed Bactrim  DS. - Advised to monitor symptoms and report changes.  Follow up early next week with culture results and want you to update as well on urinary symptoms and if left nasal area still tender

## 2024-03-21 NOTE — Progress Notes (Signed)
 Subjective:    Patient ID: Patricia Mcmahon, female    DOB: [birth year], 38 y.o.   MRN: [id number]  HPI   Patricia Mcmahon is a 38 year old female who presents with sinus pain and symptoms suggestive of a urinary tract infection.  She has had sinus pain for about a week. The pain starts inside her nostril, feels dry and tender, and radiates to her upper teeth with head pressure. Cold air worsens the pain. Symptoms began in her nose and then spread to her mouth and head and have become more persistent.  For the past few days she has had symptoms consistent with a urinary tract infection, including sweats, chills, dark foul-smelling urine, left flank pain over the kidney area, urinary frequency with incomplete emptying, lightheadedness, and fatigue similar to prior UTIs. She has frequent UTIs but has not had one for about a year. She reports prior treatment with Rocephin  injections and that she is resistant to some UTI antibiotics, though prescriptions from her usual provider have been effective.  She uses a Mirena  IUD for birth control and is allergic to morphine.       Lmp-mirena    Review of Systems  Constitutional:  Positive for diaphoresis and fatigue. Negative for chills.       But states historically with uti will get faigued.  Respiratory:  Negative for cough and chest tightness.   Cardiovascular:  Negative for chest pain.  Genitourinary:  Positive for frequency and urgency. Negative for dysuria.       Urine odor for 2 days.  Neurological:  Positive for dizziness.       In past will get light headed in past when had uti. Mild faint dizziness.   Hematological:  Negative for adenopathy.  Psychiatric/Behavioral:  Negative for confusion.     Past Medical History:  Diagnosis Date   GERD (gastroesophageal reflux disease)    no current med.   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   Left knee pain 12/16/2012   Thrombocytopenia 08/16/2011     Social History   Socioeconomic  History   Marital status: Single    Spouse name: Not on file   Number of children: Not on file   Years of education: Not on file   Highest education level: Not on file  Occupational History   Not on file  Tobacco Use   Smoking status: Every Day    Current packs/day: 1.00    Average packs/day: 1 pack/day for 16.0 years (16.0 ttl pk-yrs)    Types: Cigarettes   Smokeless tobacco: Never  Vaping Use   Vaping status: Never Used  Substance and Sexual Activity   Alcohol use: Yes    Comment: socially   Drug use: No   Sexual activity: Not on file  Other Topics Concern   Not on file  Social History Narrative   Not on file   Social Drivers of Health   Financial Resource Strain: Not on file  Food Insecurity: Food Insecurity Present (05/17/2023)   Hunger Vital Sign    Worried About Running Out of Food in the Last Year: Sometimes true    Ran Out of Food in the Last Year: Never true  Transportation Needs: No Transportation Needs (05/17/2023)   PRAPARE - Administrator, Civil Service (Medical): No    Lack of Transportation (Non-Medical): No  Physical Activity: Not on file  Stress: Not on file  Social Connections: Unknown (08/29/2021)   Received from [facility]  Social Network    Social Network: Not on file  Intimate Partner Violence: Not At Risk (05/17/2023)   Humiliation, Afraid, Rape, and Kick questionnaire    Fear of Current or Ex-Partner: No    Emotionally Abused: No    Physically Abused: No    Sexually Abused: No    Past Surgical History:  Procedure Laterality Date   CESAREAN SECTION     ESOPHAGOGASTRODUODENOSCOPY N/A 01/14/2021   Procedure: ESOPHAGOGASTRODUODENOSCOPY (EGD);  Surgeon: Burnette Fallow, MD;  Location: [facility];  Service: Endoscopy;  Laterality: N/A;   GASTRECTOMY     sleeve   KNEE ARTHROSCOPY  04/02/2012   Procedure: ARTHROSCOPY KNEE;  Surgeon: Maude KANDICE Herald, MD;  Location: [facility];  Service: Orthopedics;  Laterality:  Left;  left knee arthroscopy chondroplasty   KNEE ARTHROSCOPY WITH LATERAL RELEASE Left 01/07/2013   Procedure: LEFT KNEE ARTHROSCOPY, CHONDROPLASTY AND  LATERAL RELEASE;  Surgeon: Maude KANDICE Herald, MD;  Location: [facility];  Service: Orthopedics;  Laterality: Left;    Family History  Problem Relation Age of Onset   Alcohol abuse Mother    Cancer Mother    Learning disabilities Father    Alcohol abuse Father    Heart disease Maternal Grandmother    Diabetes Maternal Grandfather     Allergies  Allergen Reactions   Morphine Other (See Comments)    Behavior changes - becomes hostile/violent    Current Outpatient Medications on File Prior to Visit  Medication Sig Dispense Refill   Azelastine HCl 137 MCG/SPRAY SOLN Place into both nostrils.     busPIRone  (BUSPAR ) 15 MG tablet Take 1 tablet (15 mg total) by mouth 2 (two) times daily. 60 tablet 1   Cholecalciferol (VITAMIN D3) 25 MCG (1000 UT) CAPS Take 1 capsule (1,000 Units total) by mouth daily. 90 capsule 3   cyanocobalamin  (VITAMIN B12) 1000 MCG tablet Take 1 tablet (1,000 mcg total) by mouth daily. 90 tablet 1   ferrous sulfate  324 (65 Fe) MG TBEC Take 1 tablet (325 mg total) by mouth 2 (two) times daily. 60 tablet 0   lamoTRIgine  (LAMICTAL ) 150 MG tablet Take 1 tablet (150 mg total) by mouth at bedtime. 30 tablet 1   Multiple Vitamins-Minerals (MULTIVITAMIN WITH MINERALS) tablet Take 1 tablet by mouth daily. 90 tablet 1   QUEtiapine  (SEROQUEL ) 100 MG tablet Take 1 tablet (100 mg total) by mouth at bedtime. 30 tablet 1   No current facility-administered medications on file prior to visit.    BP 120/88   Pulse 79   Temp 97.7 F (36.5 C) (Oral)   Resp 17   Ht 5' 8 (1.727 m)   Wt 193 lb 9.6 oz (87.8 kg)   SpO2 98%   BMI 29.44 kg/m        Objective:   Physical Exam  General Mental Status- Alert. General Appearance- Not in acute distress.   Skin General: Color- Normal Color. Moisture- Normal  Moisture.  Neck Carotid Arteries- Normal color. Moisture- Normal Moisture. No carotid bruits. No JVD.  Chest and Lung Exam Auscultation: Breath Sounds:-Normal.  Cardiovascular Auscultation:Rythm- Regular. Murmurs & Other Heart Sounds:Auscultation of the heart reveals- No Murmurs.  Abdomen Inspection:-Inspeection Normal. Palpation/Percussion:Note:No mass. Palpation and Percussion of the abdomen reveal- Non Tender, Non Distended + BS, no rebound or guarding.  Back- faint left cva area mild tender   Neurologic Cranial Nerve exam:- CN III-XII intact(No nystagmus), symmetric smile.(Appears stable and safe to ambulate) Strength:- 5/5 equal and symmetric strength both  upper and lower extremities.   Heent- left nostril mild tender to palpation outside and on using q tip to palpate wall she reports tenderness as well. Left maxillary sinus faint tender. Left upper teeth reported to be faint tender as well. Ears- canals clear ant tm normal bilaterally.     Assessment & Plan:   Urinary tract infection possible by history and recent symptoms.  Symptoms and history suggest UTI with possible kidney involvement. Rocephin  chosen for broad coverage, Bactrim  DS rx as well pending culture. - Administered Rocephin  1 gram IM. - Prescribed Bactrim  DS. - Ordered urine culture for confirmation and sensitivity. - Advised hydration and Azo Standard for symptom relief. - Will update on culture results and adjust antibiotics if necessary. -recommend cbc and cmp based on fatigue and chills reported as well as cva pain. Pt declined labs.  Intranasal skin infection with some mild sinus tenderness and teeth pain Symptoms suggest possible MRSA nasal infection. Bactrim  DS chosen for MRSA coverage. - Prescribed Bactrim  DS. - Advised to monitor symptoms and report changes.  Follow up early next week with culture results and want you to update as well on urinary symptoms and if left nasal area still  tender  Dallas Maxwell, PA-C

## 2024-03-21 NOTE — Telephone Encounter (Signed)
 Per [facility] needs visit. Please call to schedule. Can also go to urgent care if needed.

## 2024-03-21 NOTE — Telephone Encounter (Signed)
 FYI Only or Action Required?: Action required by provider: clinical question for provider and update on patient condition. Patient would like to come in to leave urine specimen station that T. Beck, NP, knows her and has allowed her to do this in the past   Patient was last seen in primary care on 08/23/2023 by Almarie Waddell NOVAK, NP.  Called Nurse Triage reporting Dizziness.  Symptoms began a week ago.  Interventions attempted: Nothing.  Symptoms are: rapidly worsening.  Triage Disposition: See Physician Within 24 Hours  Patient/caregiver understands and will follow disposition?: Yes  Copied from CRM #[id number]. Topic: Clinical - Red Word Triage >> Mar 21, 2024 11:42 AM Dedra NOVAK wrote: Red Word that prompted transfer to Nurse Triage: Pt is experiencing dizziness, sweating, and chills. She said these are the symptoms she usually has when she has UTI. Warm transfer to NT. Reason for Disposition  [1] MODERATE dizziness (e.g., interferes with normal activities) AND [2] has NOT been evaluated by doctor (or NP/PA) for this  (Exception: Dizziness caused by heat exposure, sudden standing, or poor fluid intake.)  Answer Assessment - Initial Assessment Questions Patient reports that she started having urinary symptoms last week including foul smelling urine  Reports that she was very tired last night. And today has dizziness, sweating, and chills, concentrated and foul smelling urine  Reports that this is her normal presentation for UTI  1. DESCRIPTION: Describe your dizziness.     As soon as patient sits up, she feels dizzy.  Reports that she is ok while she is lying down with her eyes closed 2. LIGHTHEADED: Do you feel lightheaded? (e.g., somewhat faint, woozy, weak upon standing)     denies 3. VERTIGO: Do you feel like either you or the room is spinning or tilting? (i.e., vertigo)     Positive for spinning and tilting 4. SEVERITY: How bad is it?  Do you feel like you are going to  faint? Can you stand and walk?     Able to walk without assistance 5. ONSET:  When did the dizziness begin?     today 6. AGGRAVATING FACTORS: Does anything make it worse? (e.g., standing, change in head position)     Change in position 7. HEART RATE: Can you tell me your heart rate? How many beats in 15 seconds?  (Note: Not all patients can do this.)       Denies any change 8. CAUSE: What do you think is causing the dizziness? (e.g., decreased fluids or food, diarrhea, emotional distress, heat exposure, new medicine, sudden standing, vomiting; unknown) Believes that this is related to UTI 9. RECURRENT SYMPTOM: Have you had dizziness before? If Yes, ask: When was the last time? What happened that time?     Antibiotic for UTI and dizziness resolved 10. OTHER SYMPTOMS: Do you have any other symptoms? (e.g., fever, chest pain, vomiting, diarrhea, bleeding)       Not feverish, but sweating with chills 11. PREGNANCY: Is there any chance you are pregnant? When was your last menstrual period?      N/A, has mirena   Protocols used: Dizziness - Lightheadedness-A-AH

## 2024-03-22 LAB — URINE CULTURE
MICRO NUMBER:: 17318927
Result:: NO GROWTH
SPECIMEN QUALITY:: ADEQUATE

## 2024-03-23 ENCOUNTER — Ambulatory Visit: Payer: Self-pay | Admitting: Medical

## 2024-03-25 ENCOUNTER — Telehealth: Payer: Self-pay

## 2024-03-25 NOTE — Telephone Encounter (Signed)
 Copied from CRM [id number]. Topic: Clinical - Medication Question >> Mar 25, 2024 10:17 AM [name] wrote: Reason for CRM: patient called in and stated she was seen on Friday and was given a antibiotic for having a UTI and her nose was bothering her and she thinks she has a sinus infection. Said the antibiotic was helping and then now its not. Asking for different meds to be sent in. She also said her nose is dry and she gets a headache

## 2024-03-25 NOTE — Telephone Encounter (Signed)
 Will forward to Team [place]. Dallas  saw Pt on 03/21/24

## 2024-03-26 ENCOUNTER — Other Ambulatory Visit (HOSPITAL_BASED_OUTPATIENT_CLINIC_OR_DEPARTMENT_OTHER): Payer: Self-pay

## 2024-03-26 MED ORDER — DOXYCYCLINE HYCLATE 100 MG PO TABS
100.0000 mg | ORAL_TABLET | Freq: Two times a day (BID) | ORAL | 0 refills | Status: DC
  Filled 2024-03-26: qty 20, 10d supply, fill #0

## 2024-03-26 MED ORDER — DOXYCYCLINE HYCLATE 100 MG PO TABS: 100.0000 mg | ORAL_TABLET | Freq: Two times a day (BID) | ORAL | 0 refills | Status: DC

## 2024-03-26 NOTE — Addendum Note (Signed)
 Addended by: DORINA DALLAS HERO on: 03/26/2024 04:48 PM   Modules accepted: Orders

## 2024-03-27 NOTE — Telephone Encounter (Signed)
Wrote pt via mychart

## 2024-03-27 NOTE — Telephone Encounter (Signed)
 Last read by Rosina VEAR Custard at 10:23AM on 03/27/2024

## 2024-03-27 NOTE — Telephone Encounter (Signed)
Called pt left vm for her to call back.

## 2024-04-04 ENCOUNTER — Telehealth (HOSPITAL_COMMUNITY): Admitting: Physician Assistant

## 2024-04-04 DIAGNOSIS — F411 Generalized anxiety disorder: Secondary | ICD-10-CM

## 2024-04-04 DIAGNOSIS — F3132 Bipolar disorder, current episode depressed, moderate: Secondary | ICD-10-CM | POA: Diagnosis not present

## 2024-04-04 MED ORDER — BUSPIRONE HCL 15 MG PO TABS: 15.0000 mg | ORAL_TABLET | Freq: Two times a day (BID) | ORAL | 1 refills | Status: DC

## 2024-04-04 MED ORDER — QUETIAPINE FUMARATE 100 MG PO TABS: 100.0000 mg | ORAL_TABLET | Freq: Every day | ORAL | 1 refills | Status: DC

## 2024-04-04 MED ORDER — LAMOTRIGINE 150 MG PO TABS: 150.0000 mg | ORAL_TABLET | Freq: Every day | ORAL | 1 refills | Status: DC

## 2024-04-04 NOTE — Progress Notes (Addendum)
 [facility] MD/PA/NP OP Progress Note  Virtual Visit via Video Note  I connected with Patricia Mcmahon on 02/29/24 at  4:00 PM EST by a video enabled telemedicine application and verified that I am speaking with the correct person using two identifiers.  Location: Patient: Home Provider: Clinic   I discussed the limitations of evaluation and management by telemedicine and the availability of in person appointments. The patient expressed understanding and agreed to proceed.  Follow Up Instructions:   I discussed the assessment and treatment plan with the patient. The patient was provided an opportunity to ask questions and all were answered. The patient agreed with the plan and demonstrated an understanding of the instructions.   The patient was advised to call back or seek an in-person evaluation if the symptoms worsen or if the condition fails to improve as anticipated.  I provided 19 minutes of non-face-to-face time during this encounter.  Patricia FORBES Bolster, PA    02/29/2024 4:00 PM Patricia Mcmahon  MRN:  [id number]  Chief Complaint:  No chief complaint on file.  HPI:   Patricia Mcmahon is a 38 year old female with a past psychiatric history significant for bipolar 1 disorder (mixed, moderate) and generalized anxiety disorder who presents to [facility] via virtual video visit for follow-up and medication management.  Patient is currently being managed on the following psychiatric medications:  Lamictal  150 mg at bedtime Buspirone  15 mg 2 times daily Seroquel  50 mg at bedtime  Patient presents to the encounter stating that she has been taking her medications regularly and denies experiencing any adverse side effects.  Patient denies experiencing stiffness or rigidity associated with the use of her Seroquel .  She still continues to endorse depression and rates her depression a 5 out of 10 with 10 being most severe.  Patient endorses depressive episodes 2  days out of the week with symptoms lasting part of the day.  Patient endorses the following depressive symptoms: feelings of sadness, lack of motivation, crying spells, and decreased energy.  Patient denies decreased concentration, irritability, feelings of guilt/worthlessness, or hopelessness.  In addition to her depression, patient endorses anxiety and rates her anxiety a 6 out of 10.  [name] main stressor revolves around going back to work this coming Monday.  A PHQ-9 screen was performed with the patient scoring an 8.  A GAD-7 screen was also performed with the patient scoring a 12.  Patient inquired about borderline personality disorder stating that through her research, she has noticed that many of the symptoms of borderline personality disorder coincide with symptoms that she has experienced in the past.  Patient endorses a past history of mania characterized by impulsive spending and promiscuity.  She also endorses being in unstable relationships and having low self-esteem, impulsive behavior, emotional instability,  engaging in self-harming behavior, and fear of abandonment.  Patient denies past suicidal ideations or actions.  Patient is interested in signing up for talk therapy to help talk through these feelings.  Patient is alert and oriented x 4, calm, cooperative, and fully engaged in conversation during the encounter.  Patient describes her mood as content.  Patient exhibits depressed mood with appropriate affect.  Patient denies suicidal or homicidal ideations.  She further denies auditory or visual hallucinations and does not appear to be responding to internal/external stimuli.  Patient endorses increased sleep and receives on average 10 to 12 hours of sleep per night.  Patient endorses fair appetite and eats on average 2  meals per day.  Patient denies alcohol consumption or illicit drug use.  Patient endorses tobacco use and smokes on average a half pack per day.  Visit Diagnosis:     ICD-10-CM   1. Bipolar affective disorder, currently depressed, moderate (HCC)  F31.32 QUEtiapine  (SEROQUEL ) 100 MG tablet    lamoTRIgine  (LAMICTAL ) 150 MG tablet    2. 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  Head trauma: Denied Seizures: Denied DT: Denied Allergies: Morphine and codeine   Past Medical History:  Past Medical History:  Diagnosis Date   GERD (gastroesophageal reflux disease)    no current med.   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   Left knee pain 12/16/2012   Thrombocytopenia 08/16/2011    Past Surgical History:  Procedure Laterality Date   CESAREAN SECTION     ESOPHAGOGASTRODUODENOSCOPY N/A 01/14/2021   Procedure: ESOPHAGOGASTRODUODENOSCOPY (EGD);  Surgeon: Burnette Fallow, MD;  Location: [facility];  Service: Endoscopy;  Laterality: N/A;   GASTRECTOMY     sleeve   KNEE ARTHROSCOPY  04/02/2012   Procedure: ARTHROSCOPY KNEE;  Surgeon: Maude KANDICE Herald, MD;  Location: [facility];  Service: Orthopedics;  Laterality: Left;  left knee arthroscopy chondroplasty   KNEE ARTHROSCOPY WITH LATERAL RELEASE Left 01/07/2013   Procedure: LEFT KNEE ARTHROSCOPY, CHONDROPLASTY AND  LATERAL RELEASE;  Surgeon: Maude KANDICE Herald, MD;  Location: [facility];  Service: Orthopedics;  Laterality: Left;    Family Psychiatric History:  Suicide: Paternal aunt completed suicide Homicide: Unsure Psych hospitalization: Denied BiPD: ?Dad SCZ/SCzA: ?Dad Others: Dad EtOH, learning disabilities  Family History:  Family History  Problem Relation Age of Onset   Alcohol abuse Mother    Cancer Mother    Learning disabilities Father    Alcohol abuse Father    Heart  disease Maternal Grandmother    Diabetes Maternal Grandfather     Social History:  Social History   Socioeconomic History   Marital status: Single    Spouse name: Not on file   Number of children: Not on file   Years of education: Not on file   Highest education level: Not on file  Occupational History   Not on file  Tobacco Use   Smoking status: Every Day    Current packs/day: 1.00    Average packs/day: 1 pack/day for 16.0 years (16.0 ttl pk-yrs)    Types: Cigarettes   Smokeless tobacco: Never  Vaping Use   Vaping status: Never Used  Substance and Sexual Activity   Alcohol use: Yes    Comment: socially   Drug use: No   Sexual activity: Not on file  Other Topics Concern   Not on file  Social History Narrative   Not on file   Social Drivers of Health   Tobacco Use: High Risk (01/12/2024)   Patient History    Smoking Tobacco Use: Every Day    Smokeless Tobacco Use: Never    Passive Exposure: Not on file  Financial Resource Strain: Not on file  Food Insecurity: Food Insecurity Present (05/17/2023)   Hunger Vital Sign    Worried About Running Out of Food in the Last Year: Sometimes true    Ran Out of Food in the Last Year: Never true  Transportation Needs: No Transportation  Needs (05/17/2023)   PRAPARE - Administrator, Civil Service (Medical): No    Lack of Transportation (Non-Medical): No  Physical Activity: Not on file  Stress: Not on file  Social Connections: Unknown (08/29/2021)   Received from [facility]   Social Network    Social Network: Not on file  Depression (PHQ2-9): Medium Risk (02/29/2024)   Depression (PHQ2-9)    PHQ-2 Score: 8  Alcohol Screen: Not on file  Housing: Unknown (05/17/2023)   Housing Stability Vital Sign    Unable to Pay for Housing in the Last Year: Patient declined    Number of Times Moved in the Last Year: Not on file    Homeless in the Last Year: No  Utilities: Not At Risk (05/17/2023)   AHC Utilities    Threatened  with loss of utilities: No  Health Literacy: Not on file    Allergies:  Allergies  Allergen Reactions   Morphine Other (See Comments)    Behavior changes - becomes hostile/violent    Metabolic Disorder Labs: No results found for: HGBA1C, MPG No results found for: PROLACTIN Lab Results  Component Value Date   CHOL 114 07/16/2023   TRIG 88.0 07/16/2023   HDL 58.50 07/16/2023   CHOLHDL 2 07/16/2023   VLDL 17.6 07/16/2023   LDLCALC 38 07/16/2023   LDLCALC 45 04/19/2022   Lab Results  Component Value Date   TSH 0.47 07/16/2023   TSH 1.29 11/30/2022    Therapeutic Level Labs: No results found for: LITHIUM No results found for: VALPROATE No results found for: CBMZ  Current Medications: Current Outpatient Medications  Medication Sig Dispense Refill   Azelastine HCl 137 MCG/SPRAY SOLN Place into both nostrils.     busPIRone  (BUSPAR ) 15 MG tablet Take 1 tablet (15 mg total) by mouth 2 (two) times daily. 60 tablet 1   Cholecalciferol (VITAMIN D3) 25 MCG (1000 UT) CAPS Take 1 capsule (1,000 Units total) by mouth daily. 90 capsule 3   cyanocobalamin  (VITAMIN B12) 1000 MCG tablet Take 1 tablet (1,000 mcg total) by mouth daily. 90 tablet 1   doxycycline  (VIBRA -TABS) 100 MG tablet Take 1 tablet (100 mg total) by mouth 2 (two) times daily. 20 tablet 0   lamoTRIgine  (LAMICTAL ) 150 MG tablet Take 1 tablet (150 mg total) by mouth at bedtime. 30 tablet 1   QUEtiapine  (SEROQUEL ) 100 MG tablet Take 1 tablet (100 mg total) by mouth at bedtime. 30 tablet 1   sulfamethoxazole -trimethoprim  (BACTRIM  DS) 800-160 MG tablet Take 1 tablet by mouth 2 (two) times daily. 20 tablet 0   No current facility-administered medications for this visit.     Musculoskeletal: Strength & Muscle Tone: within normal limits Gait & Station: normal Patient leans: N/A  Psychiatric Specialty Exam: Review of Systems  Psychiatric/Behavioral:  Positive for sleep disturbance. Negative for decreased  concentration, dysphoric mood, hallucinations, self-injury and suicidal ideas. The patient is not nervous/anxious and is not hyperactive.     There were no vitals taken for this visit.There is no height or weight on file to calculate BMI.  General Appearance: Casual  Eye Contact:  Good  Speech:  Clear and Coherent and Normal Rate  Volume:  Normal  Mood:  Euthymic  Affect:  Appropriate  Thought Process:  Coherent, Goal Directed, and Descriptions of Associations: Intact  Orientation:  Full (Time, Place, and Person)  Thought Content: WDL   Suicidal Thoughts:  No  Homicidal Thoughts:  No  Memory:  Immediate;   Good Recent;  Good Remote;   Good  Judgement:  Good  Insight:  Good  Psychomotor Activity:  Normal  Concentration:  Concentration: Good and Attention Span: Good  Recall:  Good  Fund of Knowledge: Good  Language: Good  Akathisia:  No  Handed:  Unknown  AIMS (if indicated): done; 0  Assets:  Communication Skills Desire for Improvement Financial Resources/Insurance Housing Intimacy Leisure Time Physical Health Resilience Social Support Talents/Skills Transportation Vocational/Educational  ADL's:  Intact  Cognition: WNL  Sleep:  Fair   Screenings: AIMS    Flowsheet Row Video Visit from 02/29/2024 in [facility] Video Visit from 01/04/2024 in [facility] Video Visit from 11/30/2023 in [facility] Video Visit from 10/16/2023 in [facility]  AIMS Total Score 0 0 0 0   GAD-7    Flowsheet Row Video Visit from 02/29/2024 in [facility] Video Visit from 01/04/2024 in [facility] Video Visit from 11/30/2023 in [facility] Video Visit from 10/16/2023 in [facility] Office Visit from 07/16/2023 in [facility]   Total GAD-7 Score 12 20 18 9 10    PHQ2-9    Flowsheet Row Video Visit from 02/29/2024 in [facility] Video Visit from 01/04/2024 in [facility] Video Visit from 11/30/2023 in [facility] Video Visit from 10/16/2023 in [facility] Office Visit from 07/16/2023 in [facility] Primary Care at [facility]  PHQ-2 Total Score 2 6 6 1 2   PHQ-9 Total Score 8 24 24  -- 10   Flowsheet Row Video Visit from 02/29/2024 in [facility] Video Visit from 01/04/2024 in [facility] Video Visit from 11/30/2023 in [facility]  C-SSRS RISK CATEGORY No Risk No Risk No Risk     Assessment and Plan:   Antionetta Ator. Patricia Mcmahon is a 38 year old female with a past psychiatric history significant for bipolar 1 disorder (mixed, moderate) and generalized anxiety disorder who presents to [facility] via virtual video visit for follow-up and medication management.  Patient presents to the encounter stating that she has been taking her medications regularly and denies experiencing any adverse side effects.  An aims assessment was performed with the patient scoring a 0.  Since the introduction of Seroquel , patient endorses improvement in her mood.  She still continues to endorse depression as well as anxiety and attributes these feelings to having to go back to work after taking leave.  Though patient expresses nervousness over returning to work, she believes that she we will be able to manage after being placed on Seroquel .  A PHQ-9 screen was performed with the patient scoring an 8.  A GAD-7 screen was also performed with the patient scoring a 12.  Provider recommended patient increase her Seroquel  dosage from 50 mg to 100 mg at bedtime for mood stability.  Patient was agreeable to  recommendation.  [name] medications to be e-prescribed to pharmacy of choice.  Prior to the conclusion of the encounter, patient expressed concerns over her symptoms related to borderline personality disorder.  Patient is interested in being set up with therapy to address these concerns.  A [place] Suicide Severity Rating Scale was performed with the patient being considered no risk.  Patient denies suicidal ideations and  is able to contract for safety at this time.   Collaboration of Care: Collaboration of Care: Medication Management AEB provider managing patient's psychiatric medications, Primary Care Provider AEB patient being seen by a family medicine provider, and Psychiatrist AEB patient being followed by mental health provider at this facility  Patient/Guardian was advised Release of Information must be obtained prior to any record release in order to collaborate their care with an outside provider. Patient/Guardian was advised if they have not already done so to contact the registration department to sign all necessary forms in order for us  to release information regarding their care.   Consent: Patient/Guardian gives verbal consent for treatment and assignment of benefits for services provided during this visit. Patient/Guardian expressed understanding and agreed to proceed.   1. Bipolar affective disorder, currently depressed, moderate (HCC)  - QUEtiapine  (SEROQUEL ) 100 MG tablet; Take 1 tablet (100 mg total) by mouth at bedtime.  Dispense: 30 tablet; Refill: 1 - lamoTRIgine  (LAMICTAL ) 150 MG tablet; Take 1 tablet (150 mg total) by mouth at bedtime.  Dispense: 30 tablet; Refill: 1  2. GAD (generalized anxiety disorder)  - busPIRone  (BUSPAR ) 15 MG tablet; Take 1 tablet (15 mg total) by mouth 2 (two) times daily.  Dispense: 60 tablet; Refill: 1  Patient to follow up in 6 weeks Provider spent a total of 19 minutes with the patient/reviewing patient's chart  Patricia FORBES Bolster,  PA 02/29/2024, 4:00 PM

## 2024-04-04 NOTE — Progress Notes (Signed)
 BH MD/PA/NP OP Progress Note  Virtual Visit via Video Note  I connected with Patricia Mcmahon on 04/04/2024 at  3:00 PM EST by a video enabled telemedicine application and verified that I am speaking with the correct person using two identifiers.  Location: Patient: Home Provider: Clinic   I discussed the limitations of evaluation and management by telemedicine and the availability of in person appointments. The patient expressed understanding and agreed to proceed.  Follow Up Instructions:  I discussed the assessment and treatment plan with the patient. The patient was provided an opportunity to ask questions and all were answered. The patient agreed with the plan and demonstrated an understanding of the instructions.   The patient was advised to call back or seek an in-person evaluation if the symptoms worsen or if the condition fails to improve as anticipated.  I provided 13 minutes of non-face-to-face time during this encounter.  [name], PA    04/04/2024 7:42 PM BRYTNEE BECHLER  MRN:  [id number]  Chief Complaint:  Chief Complaint  Patient presents with   Follow-up   Medication Refill   HPI:   Patricia Mcmahon. Daily is a 38 year old female with past psychiatric history significant for bipolar disorder (currently depressed, moderate) and generalized anxiety disorder who presents to [facility] via virtual video visit for follow-up and medication management.  Patient is currently being managed on the following psychiatric medications:  Lamictal  150 mg at bedtime Seroquel  100 mg at bedtime Buspirone  15 mg 2 times daily  Patient reports that her medications have been mostly good but she is still having issues with sleep.  She reports that she will spend multiple hours mind awake at night.  Patient attributes her issues with sleep to racing thoughts characterized by thinking of several different things at once.  Patient endorses  depression but states that it has been manageable and not at the level she has experienced in the past.  She reports that she has been able to get out of bed and go to work regularly.  Patient rates her depression a 4-5 out of 10 with 10 being most severe.  Patient endorses depressive episodes 3 days out of the week (4 days if it is a rough week).  Patient endorses the following depressive symptoms: feelings of sadness, decreased energy, decreased concentration, and irritability.  Patient denies crying spells, lack of motivation, feelings of guilt/worthlessness, or hopelessness.  Patient continues to endorse anxiety and rates her anxiety a 7-8 out of 10 but denies any new stressors at this time.  Patient believes that she has been experiencing manic episodes on occasion characterized by impulsive spending and elevated mood.  A PHQ-9 screen was performed with the patient scoring a 10.  A GAD-7 screen was also performed with the patient scoring a 12.  Patient is alert and oriented x 4, calm, cooperative, and fully engaged in conversation during the encounter.  Patient describes her mood as content.  Patient exhibits depressed mood with appropriate affect.  Patient denies suicidal or homicidal ideations.  She further denies auditory or visual hallucinations and does not appear to be responding to internal/external stimuli.  Patient endorses fair sleep and receives on average 5 hours of sleep per night.  Patient endorses fair appetite and eats on average 2 meals per day.  Patient endorses alcohol consumption on occasion characterized by social drinking.  Patient endorses tobacco use and smokes on average of half pack per day.  Patient denies illicit  drug use.  Visit Diagnosis:    ICD-10-CM   1. Bipolar affective disorder, currently depressed, moderate (HCC)  F31.32 QUEtiapine  (SEROQUEL ) 100 MG tablet    lamoTRIgine  (LAMICTAL ) 150 MG tablet    2. 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  Head trauma: Denied Seizures: Denied DT: Denied Allergies: Morphine and codeine   Past Medical History:  Past Medical History:  Diagnosis Date   GERD (gastroesophageal reflux disease)    no current med.   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   Left knee pain 12/16/2012   Thrombocytopenia 08/16/2011    Past Surgical History:  Procedure Laterality Date   CESAREAN SECTION     ESOPHAGOGASTRODUODENOSCOPY N/A 01/14/2021   Procedure: ESOPHAGOGASTRODUODENOSCOPY (EGD);  Surgeon: Burnette Fallow, MD;  Location: [facility];  Service: Endoscopy;  Laterality: N/A;   GASTRECTOMY     sleeve   KNEE ARTHROSCOPY  04/02/2012   Procedure: ARTHROSCOPY KNEE;  Surgeon: Maude KANDICE Herald, MD;  Location: [facility];  Service: Orthopedics;  Laterality: Left;  left knee arthroscopy chondroplasty   KNEE ARTHROSCOPY WITH LATERAL RELEASE Left 01/07/2013   Procedure: LEFT KNEE ARTHROSCOPY, CHONDROPLASTY AND  LATERAL RELEASE;  Surgeon: Maude KANDICE Herald, MD;  Location: [facility];  Service: Orthopedics;  Laterality: Left;    Family Psychiatric History:  Suicide: Paternal aunt completed suicide Homicide: Unsure Psych hospitalization: Denied BiPD: ?Dad SCZ/SCzA: ?Dad Others: Dad EtOH, learning disabilities  Family History:  Family History  Problem Relation Age of Onset   Alcohol abuse Mother    Cancer Mother    Learning disabilities Father    Alcohol abuse Father    Heart disease Maternal Grandmother    Diabetes Maternal Grandfather     Social History:  Social History   Socioeconomic History   Marital status: Single    Spouse name: Not on file   Number of children: Not on file   Years of education:  Not on file   Highest education level: Not on file  Occupational History   Not on file  Tobacco Use   Smoking status: Every Day    Current packs/day: 1.00    Average packs/day: 1 pack/day for 16.0 years (16.0 ttl pk-yrs)    Types: Cigarettes   Smokeless tobacco: Never  Vaping Use   Vaping status: Never Used  Substance and Sexual Activity   Alcohol use: Yes    Comment: socially   Drug use: No   Sexual activity: Not on file  Other Topics Concern   Not on file  Social History Narrative   Not on file   Social Drivers of Health   Tobacco Use: High Risk (04/04/2024)   Patient History    Smoking Tobacco Use: Every Day    Smokeless Tobacco Use: Never    Passive Exposure: Not on file  Financial Resource Strain: Not on file  Food Insecurity: Food Insecurity Present (05/17/2023)   Hunger Vital Sign    Worried About Running Out of Food in the Last Year: Sometimes true    Ran Out of Food in the Last Year: Never true  Transportation Needs: No Transportation Needs (05/17/2023)   PRAPARE - Administrator, Civil Service (Medical): No    Lack of Transportation (Non-Medical): No  Physical Activity: Not on file  Stress: Not on file  Social Connections: Unknown (08/29/2021)   [facility] from [facility]   Social Network    Social Network: Not on file  Depression (PHQ2-9): Medium Risk (04/04/2024)   Depression (PHQ2-9)    PHQ-2 Score: 10  Alcohol Screen: Not on file  Housing: Unknown (05/17/2023)   Housing Stability Vital Sign    Unable to Pay for Housing in the Last Year: Patient declined    Number of Times Moved in the Last Year: Not on file    Homeless in the Last Year: No  Utilities: Not At Risk (05/17/2023)   AHC Utilities    Threatened with loss of utilities: No  Health Literacy: Not on file    Allergies: Allergies[1]  Metabolic Disorder Labs: No results found for: HGBA1C, MPG No results found for: PROLACTIN Lab Results  Component Value Date   CHOL 114  07/16/2023   TRIG 88.0 07/16/2023   HDL 58.50 07/16/2023   CHOLHDL 2 07/16/2023   VLDL 17.6 07/16/2023   LDLCALC 38 07/16/2023   LDLCALC 45 04/19/2022   Lab Results  Component Value Date   TSH 0.47 07/16/2023   TSH 1.29 11/30/2022    Therapeutic Level Labs: No results found for: LITHIUM No results found for: VALPROATE No results found for: CBMZ  Current Medications: Current Outpatient Medications  Medication Sig Dispense Refill   Azelastine HCl 137 MCG/SPRAY SOLN Place into both nostrils.     busPIRone  (BUSPAR ) 15 MG tablet Take 1 tablet (15 mg total) by mouth 2 (two) times daily. 60 tablet 1   Cholecalciferol (VITAMIN D3) 25 MCG (1000 UT) CAPS Take 1 capsule (1,000 Units total) by mouth daily. 90 capsule 3   cyanocobalamin  (VITAMIN B12) 1000 MCG tablet Take 1 tablet (1,000 mcg total) by mouth daily. 90 tablet 1   doxycycline  (VIBRA -TABS) 100 MG tablet Take 1 tablet (100 mg total) by mouth 2 (two) times daily. 20 tablet 0   lamoTRIgine  (LAMICTAL ) 150 MG tablet Take 1 tablet (150 mg total) by mouth at bedtime. 30 tablet 1   QUEtiapine  (SEROQUEL ) 100 MG tablet Take 1 tablet (100 mg total) by mouth at bedtime. 30 tablet 1   sulfamethoxazole -trimethoprim  (BACTRIM  DS) 800-160 MG tablet Take 1 tablet by mouth 2 (two) times daily. 20 tablet 0   No current facility-administered medications for this visit.     Musculoskeletal: Strength & Muscle Tone: within normal limits Gait & Station: normal Patient leans: N/A  Psychiatric Specialty Exam: Review of Systems  Psychiatric/Behavioral:  Positive for dysphoric mood and sleep disturbance. Negative for decreased concentration, hallucinations, self-injury and suicidal ideas. The patient is nervous/anxious. The patient is not hyperactive.     There were no vitals taken for this visit.There is no height or weight on file to calculate BMI.  General Appearance: Casual  Eye Contact:  Good  Speech:  Clear and Coherent and Normal Rate   Volume:  Normal  Mood:  Anxious and Depressed  Affect:  Appropriate  Thought Process:  Coherent, Goal Directed, and Descriptions of Associations: Intact  Orientation:  Full (Time, Place, and Person)  Thought Content: WDL   Suicidal Thoughts:  No  Homicidal Thoughts:  No  Memory:  Immediate;   Good Recent;   Good Remote;   Good  Judgement:  Good  Insight:  Good  Psychomotor Activity:  Normal  Concentration:  Concentration: Good and Attention Span: Good  Recall:  Good  Fund of Knowledge: Good  Language: Good  Akathisia:  No  Handed: Unknown  AIMS (if indicated): done; 0  Assets:  Communication Skills Desire for Improvement Financial Resources/Insurance Housing Intimacy Leisure Time Physical Health Resilience Social Support Talents/Skills Transportation Vocational/Educational  ADL's:  Intact  Cognition: WNL  Sleep:  Fair   Screenings: AIMS    Flowsheet Row Video Visit from 04/04/2024 in [facility] Video Visit from 02/29/2024 in [facility] Video Visit from 01/04/2024 in [facility] Video Visit from 11/30/2023 in [facility] Video Visit from 10/16/2023 in [facility]  AIMS Total Score 0 0 0 0 0   GAD-7    Flowsheet Row Video Visit from 04/04/2024 in [facility] Video Visit from 02/29/2024 in [facility] Video Visit from 01/04/2024 in [facility] Video Visit from 11/30/2023 in [facility] Video Visit from 10/16/2023 in [facility]  Total GAD-7 Score 12 12 20 18 9    PHQ2-9    Flowsheet Row Video Visit from 04/04/2024 in [facility] Video Visit from 02/29/2024 in [facility] Video Visit from 01/04/2024 in [facility] Video Visit from 11/30/2023 in [facility] Video Visit from 10/16/2023 in [facility]  PHQ-2 Total Score 3 2 6 6 1   PHQ-9 Total Score 10 8 24 24  --   Flowsheet Row Video Visit from 04/04/2024 in [facility] Video Visit from 02/29/2024 in [facility] Video Visit from 01/04/2024 in [facility]  C-SSRS RISK CATEGORY No Risk No Risk No Risk     Assessment and Plan:   Maurisha Mongeau. [place] is a 38 year old female with past psychiatric history significant for bipolar disorder (currently depressed, moderate) and generalized anxiety disorder who presents to [facility] via virtual video visit for follow-up and medication management.  Patient presents to the encounter stating that she has been taking her medications regularly and denies experiencing any adverse side effects.  An aims assessment was performed with the patient scoring a 0.  Since the last encounter, patient reports that her depression has been much more manageable than in the past.  She reports that she has been able to regularly get out of bed and go to work despite experiencing some depression.  She still continues to experience elevated anxiety but is unable to identify any discernible triggers.  A PHQ-9 screen was performed with the patient scoring a 10.  A GAD-7 screen was also performed with the patient scoring a 12.  Patient also expresses issues with sleep stating that she often spends multiple hours lying awake at night.  She attributes her inability to sleep to her racing thoughts.  Provider discussed adjusting patient's medications to help with her sleep; however, patient declined to have her medications adjusted.  Patient expressed interest in being set up with a licensed clinical social worker following the conclusion of the encounter.  Provider to set  patient up with a therapist.  Patient to continue taking her medications as prescribed.  Patient medications to be e-prescribed to pharmacy of choice.  Patient is in need of lab level for her hemoglobin A1c.  The rest of her labs are up-to-date.  An EKG was performed on 05/21/2023.  [name] QTc was 420 ms.  A [place] Suicide  Severity Rating Scale was performed with the patient being considered no risk.  Patient denies suicidal ideations and is able to contract for safety at this time.    Collaboration of Care: Collaboration of Care: Medication Management AEB provider managing patient's psychiatric medications, Primary Care Provider AEB patient being followed by a family medicine provider, and Psychiatrist AEB patient being followed by a mental health provider at this facility  Patient/Guardian was advised Release of Information must be obtained prior to any record release in order to collaborate their care with an outside provider. Patient/Guardian was advised if they have not already done so to contact the registration department to sign all necessary forms in order for us  to release information regarding their care.   Consent: Patient/Guardian gives verbal consent for treatment and assignment of benefits for services provided during this visit. Patient/Guardian expressed understanding and agreed to proceed.   1. Bipolar affective disorder, currently depressed, moderate (HCC)  - QUEtiapine  (SEROQUEL ) 100 MG tablet; Take 1 tablet (100 mg total) by mouth at bedtime.  Dispense: 30 tablet; Refill: 1 - lamoTRIgine  (LAMICTAL ) 150 MG tablet; Take 1 tablet (150 mg total) by mouth at bedtime.  Dispense: 30 tablet; Refill: 1  2. GAD (generalized anxiety disorder)  - busPIRone  (BUSPAR ) 15 MG tablet; Take 1 tablet (15 mg total) by mouth 2 (two) times daily.  Dispense: 60 tablet; Refill: 1  Patient to follow-up in 6 weeks Provider spent a total of 13 minutes with the patient's/reviewing patient's  chart  [name], PA 04/04/2024, 7:42 PM     [1]  Allergies Allergen Reactions   Morphine Other (See Comments)    Behavior changes - becomes hostile/violent

## 2024-04-06 ENCOUNTER — Encounter (HOSPITAL_COMMUNITY): Payer: Self-pay | Admitting: Physician Assistant

## 2024-05-01 ENCOUNTER — Encounter (HOSPITAL_COMMUNITY): Payer: Self-pay

## 2024-05-02 ENCOUNTER — Ambulatory Visit: Payer: Self-pay

## 2024-05-02 ENCOUNTER — Ambulatory Visit: Admitting: Family Medicine

## 2024-05-02 ENCOUNTER — Encounter: Payer: Self-pay | Admitting: Family Medicine

## 2024-05-02 VITALS — BP 131/86 | HR 114 | Temp 98.2°F | Ht 68.0 in | Wt 188.0 lb

## 2024-05-02 DIAGNOSIS — R52 Pain, unspecified: Secondary | ICD-10-CM | POA: Diagnosis not present

## 2024-05-02 LAB — POC COVID19/FLU A&B COMBO
Covid Antigen, POC: NEGATIVE
Influenza A Antigen, POC: NEGATIVE
Influenza B Antigen, POC: NEGATIVE

## 2024-05-02 NOTE — Telephone Encounter (Signed)
 FYI Only or Action Required?: FYI only for provider: appointment scheduled on 1/16.  Patient was last seen in primary care on 03/21/2024 by Dorina Loving, PA-C.  Called Nurse Triage reporting Sore Throat and Fatigue.  Symptoms began several days ago.  Interventions attempted: OTC medications: ibuprofen  .  Symptoms are: gradually worsening.  Triage Disposition: See PCP When Office is Open (Within 3 Days)  Patient/caregiver understands and will follow disposition?: Yes   Message from [facility] sent at 05/02/2024  8:31 AM EST  Reason for Triage: extremely fatigued, body is aching.. sweats.. ( sore throat initially but has gone away )   Reason for Disposition  [1] MODERATE pain (e.g., interferes with normal activities) AND [2] present > 3 days  Answer Assessment - Initial Assessment Questions Tuesday came home for work and slept all the way through until the morning, Sore scratchy throat. Weds body aches, sore throat, profuse sweating and fatigue. Ibuprofen  with mild relief.  Sore throat has resolved but patient still having generalized body aches, and extreme fatigue. She did get out of the house yesterday and after an hour she was ready to fall asleep. Some mild light headedness with extreme fatigue when out and about. Resolved when home and laying down.   Denies statin use, tick bite, mono or strep exposure. Denies CP, SOB, Cough, fever.  Appt today with PCP to assess. UC precautions understood   1. ONSET: When did the throat start hurting? (Hours or days ago)      Tues-late weds 2. SEVERITY: How bad is the sore throat? (Scale 1-10; mild, moderate or severe)     moderate 3. STREP EXPOSURE: Has there been any exposure to strep within the past week? If Yes, ask: What type of contact occurred?      Denies  4.  VIRAL SYMPTOMS: Are there any symptoms of a cold, such as a runny nose, cough, hoarse voice or red eyes?      Body aches, fatigue, night sweats 5. FEVER: Do you  have a fever? If Yes, ask: What is your temperature, how was it measured, and when did it start?     Denies  but didn't check 6. PUS ON THE TONSILS: Is there pus on the tonsils in the back of your throat?     denies 7. OTHER SYMPTOMS: Do you have any other symptoms? (e.g., difficulty breathing, headache, rash)     Hoarse voice, fatigue, body aches 8. PREGNANCY: Is there any chance you are pregnant? When was your last menstrual period?     denies  Answer Assessment - Initial Assessment Questions 1. ONSET: When did the muscle aches or body pains start?      Tuesday 2. LOCATION: What part of your body is hurting? (e.g., entire body, arms, legs)      Full body aches 3. SEVERITY: How bad is the pain? (Scale 1-10; or mild, moderate, severe)     6/10 pain  4. CAUSE: What do you think is causing the pains?     unsure 5. FEVER: Do you have a fever? If Yes, ask: What is your temperature, how was it measured, and  when did it start?      Denies but hasn't checked 6. OTHER SYMPTOMS: Do you have any other symptoms? (e.g., chest pain, cold or flu symptoms, rash, weakness, weight loss)    Body aches, extreme fatigue, profusely sweating  7. PREGNANCY: Is there any chance you are pregnant? When was your last menstrual period?  Denies  Protocols used: Sore Throat-A-AH, Muscle Aches and Body Pain-A-AH

## 2024-05-02 NOTE — Progress Notes (Signed)
 "  Acute Office Visit  Subjective:     Patient ID: Patricia Mcmahon, female    DOB: 29-Apr-1985, 39 y.o.   MRN: [id number]  Chief Complaint  Patient presents with   Generalized Body Aches    HPI Patient is in today for flu-like illness.   Discussed the use of AI scribe software for clinical note transcription with the patient, who gave verbal consent to proceed.  History of Present Illness [name] is a 39 year old female who presents with fatigue, body aches, and sweats.  She has been experiencing symptoms for approximately two and a half to three days. Initially, she felt tired and sore throat after work on Tuesday, leading to four hours of sleep. At that time, she experienced mild soreness and body aches.  On Wednesday, she slept most of the day, with her boyfriend and son waking her occasionally to eat or drink. Her sore throat began to improve by Wednesday night, but she experienced increased body aches.  By Thursday, the sore throat had resolved, but she began experiencing frequent sweats. Despite feeling slightly better on Friday morning, she continued to feel unwell and fatigued.  No cough, congestion, nausea, vomiting, or diarrhea. She reports lightheadedness and a decreased appetite. She has been taking over-the-counter medications similar to DayQuil and NyQuil.          ROS All review of systems negative except what is listed in the HPI      Objective:    BP 131/86   Pulse (!) 114   Temp 98.2 F (36.8 C) (Oral)   Ht 5' 8 (1.727 m)   Wt 188 lb (85.3 kg)   SpO2 97%   BMI 28.59 kg/m    Physical Exam Vitals reviewed.  Constitutional:      General: She is not in acute distress.    Appearance: Normal appearance. She is ill-appearing.  HENT:     Right Ear: Tympanic membrane normal.     Left Ear: Tympanic membrane normal.     Nose: No congestion or rhinorrhea.     Mouth/Throat:     Mouth: Mucous membranes are moist.     Pharynx: Oropharynx is  clear.  Cardiovascular:     Rate and Rhythm: Normal rate and regular rhythm.     Heart sounds: Normal heart sounds.  Pulmonary:     Effort: Pulmonary effort is normal.     Breath sounds: Normal breath sounds. No wheezing, rhonchi or rales.  Lymphadenopathy:     Cervical: No cervical adenopathy.  Skin:    General: Skin is warm and dry.  Neurological:     Mental Status: She is alert and oriented to person, place, and time.  Psychiatric:        Mood and Affect: Mood normal.        Behavior: Behavior normal.        Thought Content: Thought content normal.        Judgment: Judgment normal.          Results for orders placed or performed in visit on 05/02/24  POC Covid19/Flu A&B Antigen  Result Value Ref Range   Influenza A Antigen, POC Negative Negative   Influenza B Antigen, POC Negative Negative   Covid Antigen, POC Negative Negative        Assessment & Plan:   Problem List Items Addressed This Visit   None Visit Diagnoses       Body aches    -  Primary  Relevant Orders   POC Covid19/Flu A&B Antigen (Completed)       Assessment & Plan Acute viral upper respiratory infection Symptoms consistent with viral etiology, negative for influenza and COVID-19. Persistent body aches and fatigue. - Advised rest and hydration. - Continue supportive measures including rest, hydration, humidifier use, steam showers, warm compresses to sinuses, warm liquids with lemon and honey, and over-the-counter cough, cold, and analgesics as needed.  - Provided work note for return on Monday.       No orders of the defined types were placed in this encounter.   Return if symptoms worsen or fail to improve.  Mcmahon Patricia Mon, NP   "

## 2024-05-02 NOTE — Telephone Encounter (Signed)
 Appt scheduled

## 2024-05-02 NOTE — Patient Instructions (Signed)
 Likely a viral upper respiratory infection. Negative Flu and COVID testing today.  Continue supportive measures including rest, hydration, humidifier use, steam showers, warm compresses to sinuses, warm liquids with lemon and honey, and over-the-counter cough, cold, and analgesics as needed. If symptoms persist 8-10 days, become severe, or return after a few days of feeling better, then please follow-up for repeat evaluation to determine if antibiotics may be necessary.  Over the counter medications that may be helpful for symptoms:  Guaifenesin  1200 mg extended release tabs twice daily, with plenty of water For cough and congestion Brand name: Mucinex    Pseudoephedrine 30 mg, one or two tabs every 4 to 6 hours For sinus congestion Brand name: Sudafed You must get this from the pharmacy counter.  Oxymetazoline nasal spray each morning, one spray in each nostril, for NO MORE THAN 3 days  For nasal and sinus congestion Brand name: Afrin Saline nasal spray or Saline Nasal Irrigation (Netti Pot, etc) 3-5 times a day For nasal and sinus congestion Brand names: Ocean or AYR Fluticasone  nasal spray OR Mometasone nasal spray OR Triamcinolone Acetonide nasal spray - follow directions on the packaging For nasal and sinus congestion Brand name: Flonase , Nasonex, Nasacort Warm salt water gargles  For sore throat Every few hours as needed Alternate ibuprofen  400-600 mg and acetaminophen  1000 mg every 6 hours For fever, body aches, headache Brand names: Motrin  or Advil  and Tylenol  Dextromethorphan  12-hour cough version 30 mg every 12 hours  For cough Brand name: Delsym  Stop all other cold medications for now (Nyquil, Dayquil, Tylenol  Cold, Theraflu, etc) and other non-prescription cough/cold preparations. Many of these have the same ingredients listed above and could cause an overdose of medication.   Herbal treatments that have been shown to be helpful in some patients include: Vitamin C 1000 mg  per day Zinc 100 mg per day Quercetin 25-500 mg twice a day Melatonin 5-10mg  at bedtime Honey Green Tea  General Instructions Allow your body to rest Drink PLENTY of fluids Typically, we are the most contagious 1-2 days before symptoms start through the first 2-3 days of most severe symptoms. Per CDC guidelines, you can return to school/work when symptoms have started to improve and you have been fever-free for 24 hours. However, recommend you continue extra precautions for the following 5 days (frequent hand hygiene, masking, covering coughs/sneezes, minimize exposure to immunocompromised individuals, etc).  If you develop severe shortness of breath, uncontrolled fevers, coughing up blood, confusion, chest pain, or signs of dehydration (such as significantly decreased urine amounts or dizziness with standing) please go to the nearest ER.

## 2024-05-05 ENCOUNTER — Ambulatory Visit (HOSPITAL_COMMUNITY)

## 2024-05-16 ENCOUNTER — Telehealth (HOSPITAL_COMMUNITY): Admitting: Physician Assistant

## 2024-05-19 ENCOUNTER — Ambulatory Visit (HOSPITAL_COMMUNITY)

## 2024-05-19 ENCOUNTER — Encounter (HOSPITAL_COMMUNITY): Payer: Self-pay

## 2024-05-23 ENCOUNTER — Telehealth (HOSPITAL_COMMUNITY): Admitting: Physician Assistant

## 2024-05-23 ENCOUNTER — Encounter (HOSPITAL_COMMUNITY): Payer: Self-pay | Admitting: Physician Assistant

## 2024-05-23 DIAGNOSIS — F411 Generalized anxiety disorder: Secondary | ICD-10-CM

## 2024-05-23 DIAGNOSIS — F3132 Bipolar disorder, current episode depressed, moderate: Secondary | ICD-10-CM

## 2024-05-23 MED ORDER — LAMOTRIGINE 200 MG PO TABS: 200.0000 mg | ORAL_TABLET | Freq: Every day | ORAL | 1 refills | Status: AC

## 2024-05-23 MED ORDER — BUSPIRONE HCL 15 MG PO TABS: 15.0000 mg | ORAL_TABLET | Freq: Two times a day (BID) | ORAL | 1 refills | Status: AC

## 2024-05-23 MED ORDER — QUETIAPINE FUMARATE 100 MG PO TABS: 100.0000 mg | ORAL_TABLET | Freq: Every day | ORAL | 1 refills | Status: AC

## 2024-05-23 NOTE — Progress Notes (Cosign Needed)
 BH MD/PA/NP OP Progress Note  Virtual Visit via Video Note  I connected with Patricia Mcmahon on 05/23/24 at  3:00 PM EST by a video enabled telemedicine application and verified that I am speaking with the correct person using two identifiers.  Location: Patient: Home Provider: Clinic   I discussed the limitations of evaluation and management by telemedicine and the availability of in person appointments. The patient expressed understanding and agreed to proceed.  Follow Up Instructions:  I discussed the assessment and treatment plan with the patient. The patient was provided an opportunity to ask questions and all were answered. The patient agreed with the plan and demonstrated an understanding of the instructions.   The patient was advised to call back or seek an in-person evaluation if the symptoms worsen or if the condition fails to improve as anticipated.  I provided 12 minutes of non-face-to-face time during this encounter.  Patricia FORBES Bolster, PA    05/23/2024 4:23 PM Patricia Mcmahon  MRN:  [id number]  Chief Complaint:  Chief Complaint  Patient presents with   Follow-up   Medication Management   HPI:   Patricia Mcmahon is a 39 year old female with past psychiatric history significant for bipolar disorder (currently depressed, moderate) and generalized anxiety disorder who presents to [facility] via virtual video visit for follow-up and medication management.  Patient is currently being managed on the following psychiatric medications:  Lamictal  150 mg at bedtime Seroquel  100 mg at bedtime Buspirone  15 mg 2 times daily  Patient presents to the encounter stating that she has been taking her medications regularly and denies experiencing any adverse side effects.  She continues to endorse depression but states that her symptoms have been mostly manageable.  Patient rates her depression a 2-3 out of 10 with 10 being most severe.  Patient  endorses depressive episodes 2 days out of the week.  Patient endorses the following depressive symptoms: feelings of sadness, crying spells, and irritability.  Patient denies lack of motivation, decreased concentration, decreased energy, feelings of guilt/worthlessness, or hopelessness.  Patient denies any contributing factors to her depression.  Patient further denies experiencing any manic behavior as of late.  Patient denies anxiety.  Patient denies any new stressors at this time.  A PHQ-9 screen was performed with the patient scoring a 5.  A GAD-7 screen was also performed with the patient scoring a 10.  Patient is alert and oriented x 4, calm, cooperative, and fully engaged in conversation during the encounter.  Patient endorses irritable mood.  Patient exhibits euthymic mood with appropriate affect.  Patient denies suicidal or homicidal ideations.  She further denies auditory or visual hallucinations and does not appear to be responding to internal/external stimuli.  Patient endorses fair sleep and receives either 4 hours of sleep per night for 12 hours.  Patient endorses good appetite and eats on average 2 meals per day.  Patient denies alcohol consumption or illicit drug use.  Patient endorses tobacco use and smokes on average a half pack per day.  Visit Diagnosis:    ICD-10-CM   1. Bipolar affective disorder, currently depressed, moderate (HCC)  F31.32 QUEtiapine  (SEROQUEL ) 100 MG tablet    lamoTRIgine  (LAMICTAL ) 200 MG tablet    2. 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  Head trauma: Denied Seizures: Denied DT: Denied Allergies: Morphine and codeine   Past Medical History:  Past Medical History:   Diagnosis Date   GERD (gastroesophageal reflux disease)    no current med.   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   Left knee pain 12/16/2012   Thrombocytopenia 08/16/2011    Past Surgical History:  Procedure Laterality Date   CESAREAN SECTION     ESOPHAGOGASTRODUODENOSCOPY N/A 01/14/2021   Procedure: ESOPHAGOGASTRODUODENOSCOPY (EGD);  Surgeon: Burnette Fallow, MD;  Location: [facility];  Service: Endoscopy;  Laterality: N/A;   GASTRECTOMY     sleeve   KNEE ARTHROSCOPY  04/02/2012   Procedure: ARTHROSCOPY KNEE;  Surgeon: Maude KANDICE Herald, MD;  Location: [facility];  Service: Orthopedics;  Laterality: Left;  left knee arthroscopy chondroplasty   KNEE ARTHROSCOPY WITH LATERAL RELEASE Left 01/07/2013   Procedure: LEFT KNEE ARTHROSCOPY, CHONDROPLASTY AND  LATERAL RELEASE;  Surgeon: Maude KANDICE Herald, MD;  Location: [facility];  Service: Orthopedics;  Laterality: Left;    Family Psychiatric History:  Suicide: Paternal aunt completed suicide Homicide: Unsure Psych hospitalization: Denied BiPD: ?Dad SCZ/SCzA: ?Dad Others: Dad EtOH, learning disabilities  Family History:  Family History  Problem Relation Age of Onset   Alcohol abuse Mother    Cancer Mother    Learning disabilities Father    Alcohol abuse Father    Heart disease Maternal Grandmother    Diabetes Maternal Grandfather     Social History:  Social History   Socioeconomic History   Marital status: Single    Spouse name: Not on file   Number of children: Not on file   Years of education: Not on file   Highest education level: Not on file  Occupational History   Not on file  Tobacco Use   Smoking status: Every Day    Current packs/day: 1.00    Average packs/day: 1 pack/day for 16.0 years (16.0 ttl pk-yrs)    Types: Cigarettes   Smokeless tobacco: Never  Vaping Use   Vaping status: Never Used  Substance and Sexual Activity   Alcohol use: Yes    Comment: socially   Drug  use: No   Sexual activity: Not on file  Other Topics Concern   Not on file  Social History Narrative   Not on file   Social Drivers of Health   Tobacco Use: High Risk (05/23/2024)   Patient History    Smoking Tobacco Use: Every Day    Smokeless Tobacco Use: Never    Passive Exposure: Not on file  Financial Resource Strain: Not on file  Food Insecurity: Food Insecurity Present (05/17/2023)   Hunger Vital Sign    Worried About Running Out of Food in the Last Year: Sometimes true    Ran Out of Food in the Last Year: Never true  Transportation Needs: No Transportation Needs (05/17/2023)   PRAPARE - Administrator, Civil Service (Medical): No    Lack of Transportation (Non-Medical): No  Physical Activity: Not on file  Stress: Not on file  Social Connections: Unknown (08/29/2021)   Received from [facility]   Social Network    Social Network: Not on file  Depression (PHQ2-9): Medium Risk (05/23/2024)   Depression (PHQ2-9)    PHQ-2 Score: 5  Alcohol Screen: Not on file  Housing: Unknown (05/17/2023)   Housing Stability Vital Sign    Unable to Pay for Housing in the Last Year: Patient  declined    Number of Times Moved in the Last Year: Not on file    Homeless in the Last Year: No  Utilities: Not At Risk (05/17/2023)   AHC Utilities    Threatened with loss of utilities: No  Health Literacy: Not on file    Allergies: Allergies[1]  Metabolic Disorder Labs: No results found for: HGBA1C, MPG No results found for: PROLACTIN Lab Results  Component Value Date   CHOL 114 07/16/2023   TRIG 88.0 07/16/2023   HDL 58.50 07/16/2023   CHOLHDL 2 07/16/2023   VLDL 17.6 07/16/2023   LDLCALC 38 07/16/2023   LDLCALC 45 04/19/2022   Lab Results  Component Value Date   TSH 0.47 07/16/2023   TSH 1.29 11/30/2022    Therapeutic Level Labs: No results found for: LITHIUM No results found for: VALPROATE No results found for: CBMZ  Current Medications: Current  Outpatient Medications  Medication Sig Dispense Refill   Azelastine HCl 137 MCG/SPRAY SOLN Place into both nostrils.     busPIRone  (BUSPAR ) 15 MG tablet Take 1 tablet (15 mg total) by mouth 2 (two) times daily. 60 tablet 1   Cholecalciferol (VITAMIN D3) 25 MCG (1000 UT) CAPS Take 1 capsule (1,000 Units total) by mouth daily. 90 capsule 3   cyanocobalamin  (VITAMIN B12) 1000 MCG tablet Take 1 tablet (1,000 mcg total) by mouth daily. 90 tablet 1   lamoTRIgine  (LAMICTAL ) 200 MG tablet Take 1 tablet (200 mg total) by mouth at bedtime. 30 tablet 1   QUEtiapine  (SEROQUEL ) 100 MG tablet Take 1 tablet (100 mg total) by mouth at bedtime. 30 tablet 1   No current facility-administered medications for this visit.     Musculoskeletal: Strength & Muscle Tone: within normal limits Gait & Station: normal Patient leans: N/A  Psychiatric Specialty Exam: Review of Systems  Psychiatric/Behavioral:  Positive for dysphoric mood and sleep disturbance. Negative for decreased concentration, hallucinations, self-injury and suicidal ideas. The patient is nervous/anxious. The patient is not hyperactive.     There were no vitals taken for this visit.There is no height or weight on file to calculate BMI.  General Appearance: Casual  Eye Contact:  Good  Speech:  Clear and Coherent and Normal Rate  Volume:  Normal  Mood:  Anxious and Depressed  Affect:  Appropriate  Thought Process:  Coherent, Goal Directed, and Descriptions of Associations: Intact  Orientation:  Full (Time, Place, and Person)  Thought Content: WDL   Suicidal Thoughts:  No  Homicidal Thoughts:  No  Memory:  Immediate;   Good Recent;   Good Remote;   Good  Judgement:  Good  Insight:  Good  Psychomotor Activity:  Normal  Concentration:  Concentration: Good and Attention Span: Good  Recall:  Good  Fund of Knowledge: Good  Language: Good  Akathisia:  No  Handed: Unknown  AIMS (if indicated): done; 0  Assets:  Communication Skills Desire  for Improvement Financial Resources/Insurance Housing Intimacy Leisure Time Physical Health Resilience Social Support Talents/Skills Transportation Vocational/Educational  ADL's:  Intact  Cognition: WNL  Sleep:  Fair   Screenings: AIMS    Flowsheet Row Video Visit from 05/23/2024 in [facility] Video Visit from 04/04/2024 in [facility] Video Visit from 02/29/2024 in [facility] Video Visit from 01/04/2024 in [facility] Video Visit from 11/30/2023 in [facility]  AIMS Total Score 0 0 0 0 0   GAD-7  Flowsheet Row Video Visit from 05/23/2024 in [facility] Video Visit from 04/04/2024 in [facility] Video Visit from 02/29/2024 in [facility] Video Visit from 01/04/2024 in [facility] Video Visit from 11/30/2023 in [facility]  Total GAD-7 Score 10 12 12 20 18    PHQ2-9    Flowsheet Row Video Visit from 05/23/2024 in [facility] Video Visit from 04/04/2024 in [facility] Video Visit from 02/29/2024 in [facility] Video Visit from 01/04/2024 in [facility] Video Visit from 11/30/2023 in [facility]  PHQ-2 Total Score 1 3 2 6 6   PHQ-9 Total Score 5 10 8 24 24    Flowsheet Row Video Visit from 05/23/2024 in [facility] Video Visit from 04/04/2024 in [facility] Video Visit from 02/29/2024 in [facility]  C-SSRS RISK CATEGORY No Risk No Risk No Risk     Assessment and Plan:   Patricia Mcmahon is a 39 year old female with past psychiatric history significant for bipolar disorder  (currently depressed, moderate) and generalized anxiety disorder who presents to [facility] via virtual video visit for follow-up and medication management.  Patient presents to the encounter stating that she has been taking her medications regularly and denies experiencing any adverse side effects.  An aims assessment was performed with the patient scoring a 1.  Patient presents to the encounter stating that she has been experiencing some depressive symptoms but states that they have been manageable.  Patient denies anxiety at this time.  A PHQ-9 screen was performed with the patient scoring a 5.  A GAD-7 screen was also performed with the patient scoring a 10.  Though patient's depression and anxiety are manageable, she reports that she still continues to experience irritability.  She also reports that she has been experiencing issues with sleep stating that she either receives 4 hours of sleep per night or 12 hours.  Patient believes that her sleep issues may be attributed to not going to bed as soon as she takes her Seroquel .  For the management of her irritability, provider recommended adjusting her Lamictal  dosage from 150 mg to 200 mg daily.  Patient was agreeable to recommendation.  Patient to continue taking all other medications as prescribed.  [name] medications to be e-prescribed to pharmacy of choice.  An EKG was performed on 05/21/2023.  [name] QTc was 420 ms.   Due to patient's use of Seroquel , the following labs will be obtained from the patient: Hemoglobin A1c, comprehensive metabolic panel, lipid profile, and complete blood count with differential.  Patient will also need an up-to-date EKG performed.  Patient informed provider that she would have these labs and the EKG performed at her primary care office.  A [place] Suicide Severity Rating Scale was performed with the patient being considered no risk.  Patient denies suicidal ideations and  is able to contract for safety at this time.    Collaboration of Care: Collaboration of Care: Medication Management AEB provider managing patient's psychiatric medications, Primary Care Provider AEB patient being followed by a family medicine provider, and Psychiatrist AEB patient being followed by a mental health provider at this facility  Patient/Guardian was advised Release of Information must be obtained prior to any record release in order to collaborate their care with an outside  provider. Patient/Guardian was advised if they have not already done so to contact the registration department to sign all necessary forms in order for us  to release information regarding their care.   Consent: Patient/Guardian gives verbal consent for treatment and assignment of benefits for services provided during this visit. Patient/Guardian expressed understanding and agreed to proceed.   1. Bipolar affective disorder, currently depressed, moderate (HCC)  - QUEtiapine  (SEROQUEL ) 100 MG tablet; Take 1 tablet (100 mg total) by mouth at bedtime.  Dispense: 30 tablet; Refill: 1 - lamoTRIgine  (LAMICTAL ) 200 MG tablet; Take 1 tablet (200 mg total) by mouth at bedtime.  Dispense: 30 tablet; Refill: 1  2. GAD (generalized anxiety disorder)  - busPIRone  (BUSPAR ) 15 MG tablet; Take 1 tablet (15 mg total) by mouth 2 (two) times daily.  Dispense: 60 tablet; Refill: 1  Patient to follow-up in 6 weeks Provider spent a total of 12 minutes with the patient's/reviewing patient's chart  Patricia FORBES Bolster, PA 05/23/2024, 4:23 PM      [1]  Allergies Allergen Reactions   Morphine Other (See Comments)    Behavior changes - becomes hostile/violent

## 2024-06-18 ENCOUNTER — Ambulatory Visit (HOSPITAL_COMMUNITY)

## 2024-07-04 ENCOUNTER — Telehealth (HOSPITAL_COMMUNITY): Admitting: Physician Assistant

## 2024-07-17 ENCOUNTER — Encounter: Admitting: Family Medicine
